# Patient Record
Sex: Female | Born: 1940
Health system: Southern US, Community
[De-identification: ages and names within clinical notes are randomized; demographics above are authoritative.]

## PROBLEM LIST (undated history)

## (undated) DIAGNOSIS — M1990 Unspecified osteoarthritis, unspecified site: Secondary | ICD-10-CM

## (undated) DIAGNOSIS — E039 Hypothyroidism, unspecified: Secondary | ICD-10-CM

## (undated) DIAGNOSIS — M1712 Unilateral primary osteoarthritis, left knee: Secondary | ICD-10-CM

## (undated) DIAGNOSIS — M1711 Unilateral primary osteoarthritis, right knee: Secondary | ICD-10-CM

## (undated) DIAGNOSIS — E785 Hyperlipidemia, unspecified: Secondary | ICD-10-CM

## (undated) DIAGNOSIS — M159 Polyosteoarthritis, unspecified: Secondary | ICD-10-CM

## (undated) DIAGNOSIS — Z9889 Other specified postprocedural states: Secondary | ICD-10-CM

## (undated) DIAGNOSIS — I10 Essential (primary) hypertension: Secondary | ICD-10-CM

## (undated) DIAGNOSIS — R112 Nausea with vomiting, unspecified: Secondary | ICD-10-CM

## (undated) DIAGNOSIS — I119 Hypertensive heart disease without heart failure: Secondary | ICD-10-CM

## (undated) DIAGNOSIS — Z973 Presence of spectacles and contact lenses: Secondary | ICD-10-CM

## (undated) DIAGNOSIS — Z789 Other specified health status: Secondary | ICD-10-CM

## (undated) HISTORY — DX: Other specified health status: Z78.9

## (undated) HISTORY — PX: EYE SURGERY: SHX253

## (undated) HISTORY — PX: TONSILLECTOMY: SUR1361

## (undated) HISTORY — PX: COLONOSCOPY: SHX174

## (undated) HISTORY — DX: Hypertensive heart disease without heart failure: I11.9

## (undated) HISTORY — DX: Polyosteoarthritis, unspecified: M15.9

---
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
## 1976-07-19 HISTORY — PX: ABDOMINAL HYSTERECTOMY: SHX81

## 2001-05-19 ENCOUNTER — Encounter: Payer: Self-pay | Admitting: Family Medicine

## 2001-05-19 ENCOUNTER — Encounter: Admission: RE | Admit: 2001-05-19 | Discharge: 2001-05-19 | Payer: Self-pay | Admitting: Family Medicine

## 2001-08-29 ENCOUNTER — Encounter: Payer: Self-pay | Admitting: Family Medicine

## 2001-08-29 ENCOUNTER — Encounter: Admission: RE | Admit: 2001-08-29 | Discharge: 2001-08-29 | Payer: Self-pay | Admitting: Family Medicine

## 2001-11-08 ENCOUNTER — Encounter: Payer: Self-pay | Admitting: Family Medicine

## 2001-11-08 ENCOUNTER — Encounter: Admission: RE | Admit: 2001-11-08 | Discharge: 2001-11-08 | Payer: Self-pay | Admitting: Family Medicine

## 2002-12-20 ENCOUNTER — Encounter: Admission: RE | Admit: 2002-12-20 | Discharge: 2002-12-20 | Payer: Self-pay | Admitting: Interventional Cardiology

## 2002-12-20 ENCOUNTER — Encounter: Payer: Self-pay | Admitting: Interventional Cardiology

## 2002-12-24 ENCOUNTER — Ambulatory Visit (HOSPITAL_COMMUNITY): Admission: RE | Admit: 2002-12-24 | Discharge: 2002-12-24 | Payer: Self-pay | Admitting: Interventional Cardiology

## 2003-07-08 ENCOUNTER — Encounter: Admission: RE | Admit: 2003-07-08 | Discharge: 2003-07-08 | Payer: Self-pay | Admitting: Family Medicine

## 2003-07-11 ENCOUNTER — Encounter: Admission: RE | Admit: 2003-07-11 | Discharge: 2003-07-11 | Payer: Self-pay | Admitting: Family Medicine

## 2004-02-20 ENCOUNTER — Encounter: Admission: RE | Admit: 2004-02-20 | Discharge: 2004-02-20 | Payer: Self-pay | Admitting: Family Medicine

## 2004-12-24 ENCOUNTER — Encounter: Admission: RE | Admit: 2004-12-24 | Discharge: 2004-12-24 | Payer: Self-pay | Admitting: Otolaryngology

## 2004-12-29 ENCOUNTER — Encounter: Admission: RE | Admit: 2004-12-29 | Discharge: 2004-12-29 | Payer: Self-pay | Admitting: Otolaryngology

## 2005-03-02 ENCOUNTER — Encounter: Admission: RE | Admit: 2005-03-02 | Discharge: 2005-03-02 | Payer: Self-pay | Admitting: Family Medicine

## 2006-03-03 ENCOUNTER — Encounter: Admission: RE | Admit: 2006-03-03 | Discharge: 2006-03-03 | Payer: Self-pay | Admitting: Family Medicine

## 2014-04-19 ENCOUNTER — Other Ambulatory Visit: Payer: Self-pay | Admitting: Internal Medicine

## 2014-04-19 ENCOUNTER — Inpatient Hospital Stay: Admission: RE | Admit: 2014-04-19 | Payer: Self-pay | Source: Ambulatory Visit

## 2014-04-19 DIAGNOSIS — R52 Pain, unspecified: Secondary | ICD-10-CM

## 2014-04-19 DIAGNOSIS — R609 Edema, unspecified: Secondary | ICD-10-CM

## 2014-04-24 ENCOUNTER — Ambulatory Visit
Admission: RE | Admit: 2014-04-24 | Discharge: 2014-04-24 | Disposition: A | Discharge status: Home / Self Care | Payer: Medicare Other | Source: Ambulatory Visit | Attending: Internal Medicine | Admitting: Internal Medicine

## 2014-04-24 DIAGNOSIS — R52 Pain, unspecified: Secondary | ICD-10-CM

## 2014-04-24 DIAGNOSIS — R609 Edema, unspecified: Secondary | ICD-10-CM

## 2014-04-24 IMAGING — US US EXTREM LOW VENOUS*R*
1 series · 13 of 24 positions shown · non-contrast
Comparison: None.

CLINICAL DATA: Right leg swelling and pain.  No previous injury.



[Series 1: us extrem low venous*right* · 13 of 31 slices shown]
[im 1/31]
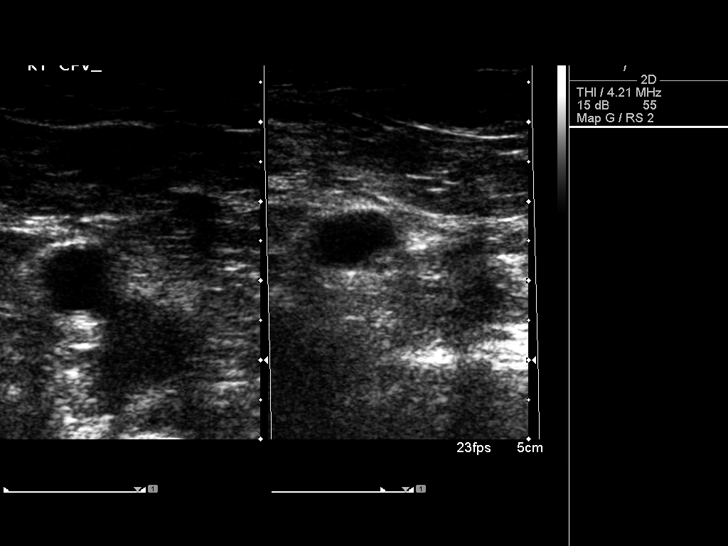
[im 3/31]
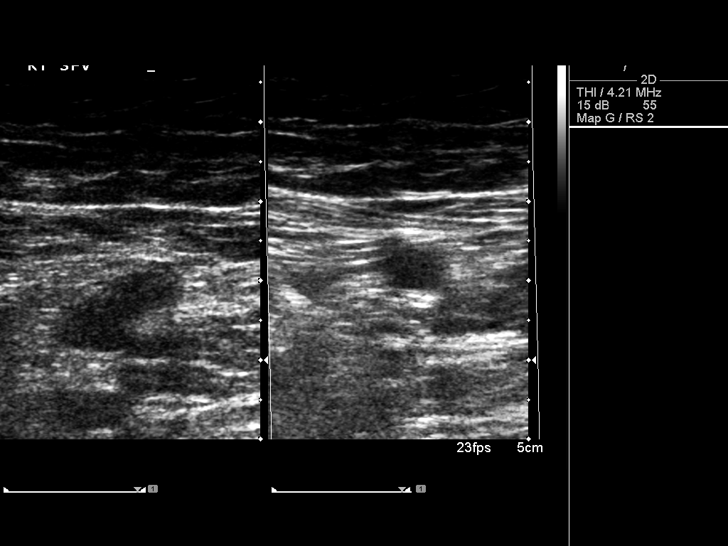
[im 6/31]
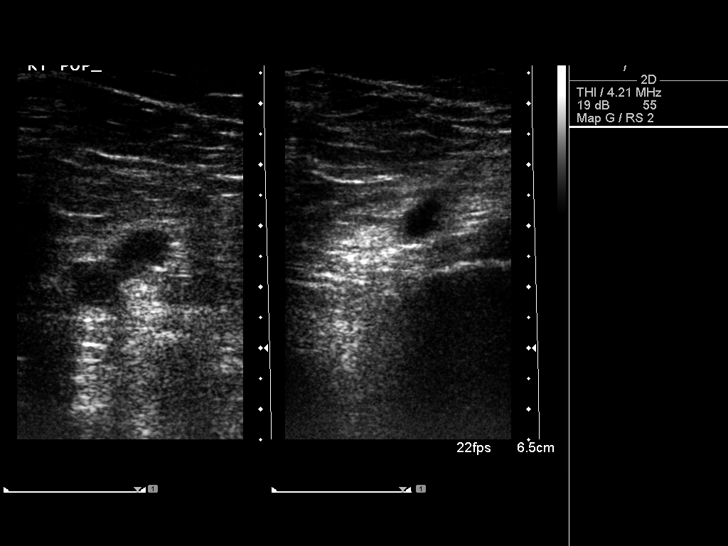
[im 8/31]
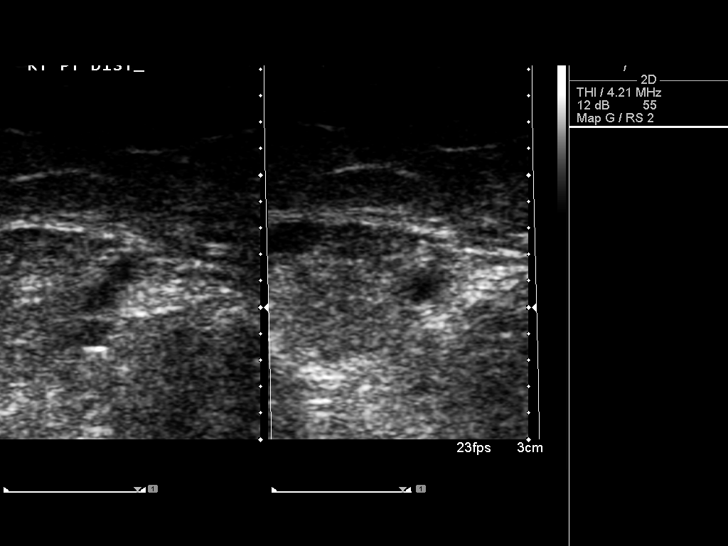
[im 11/31]
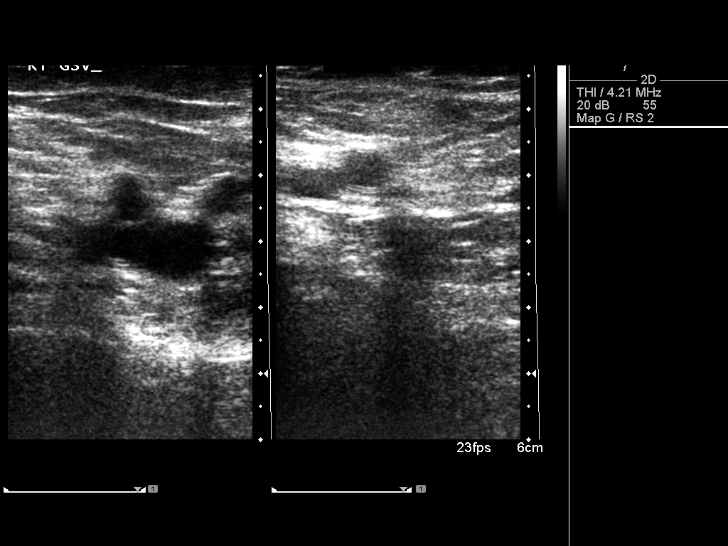
[im 14/31]
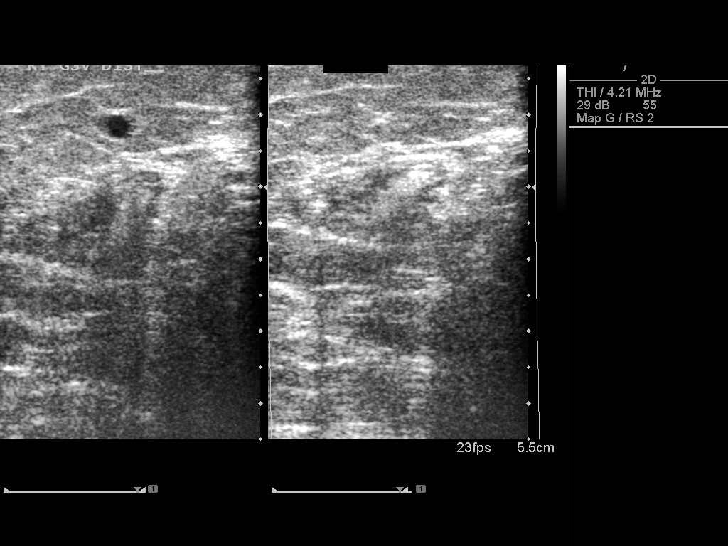
[im 16/31]
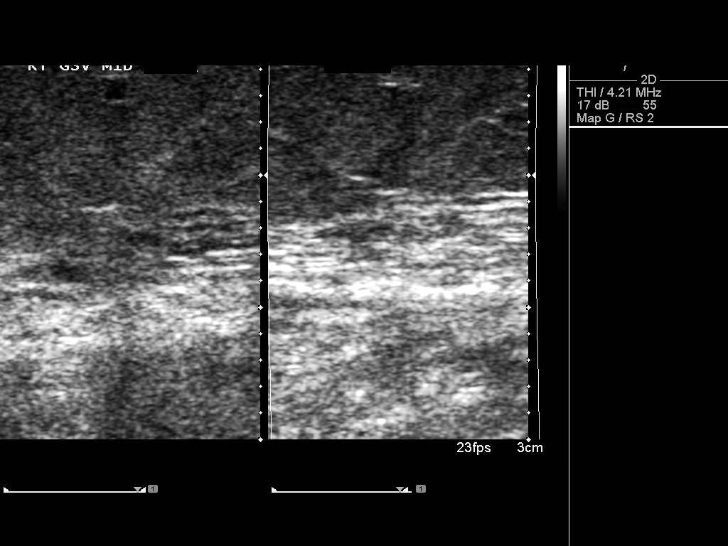
[im 17/31]
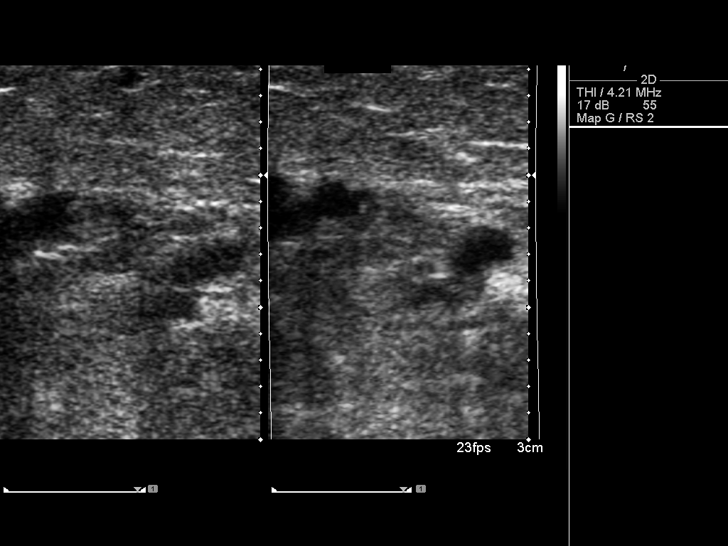
[im 20/31]
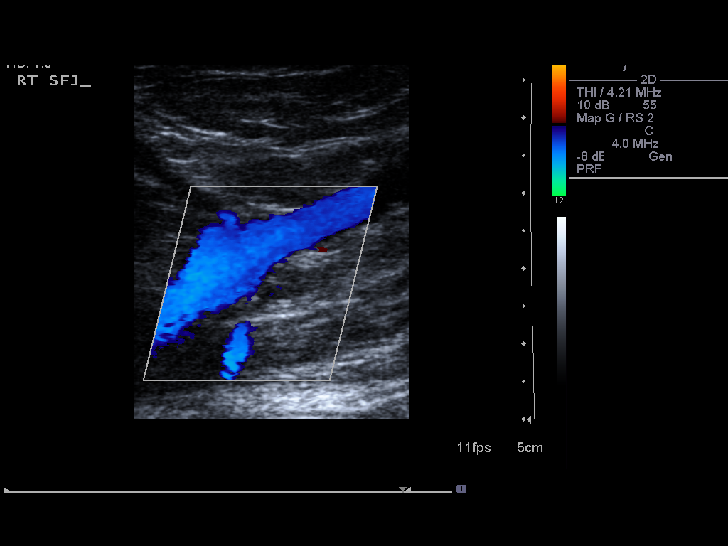
[im 23/31]
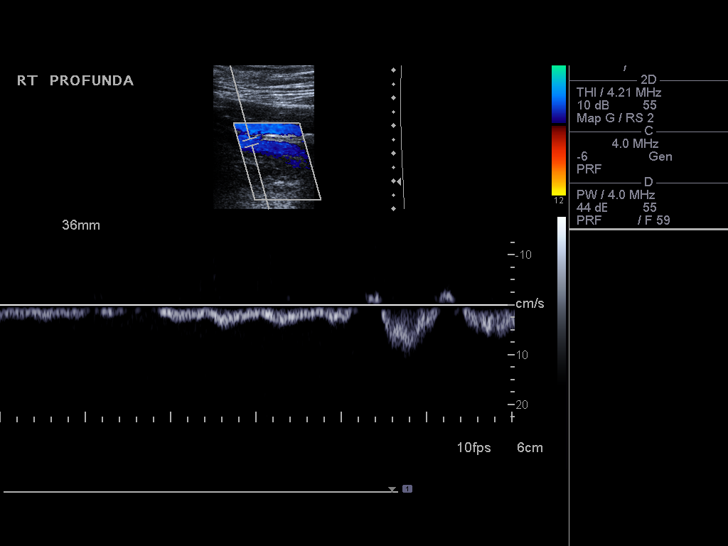
[im 25/31]
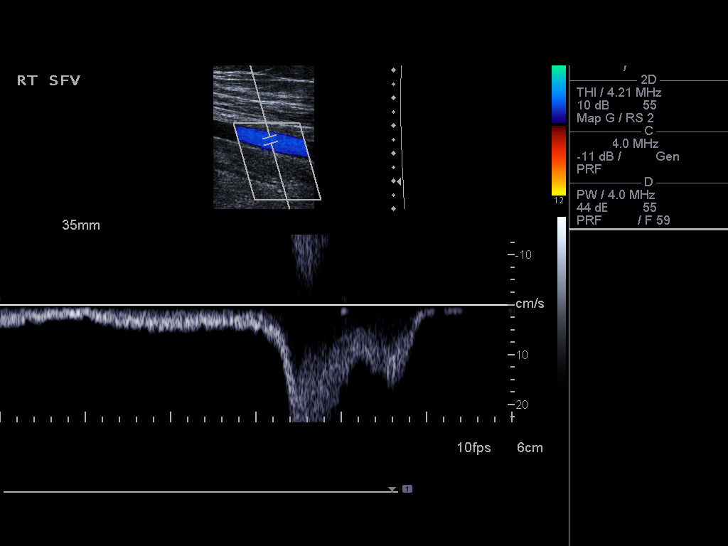
[im 28/31]
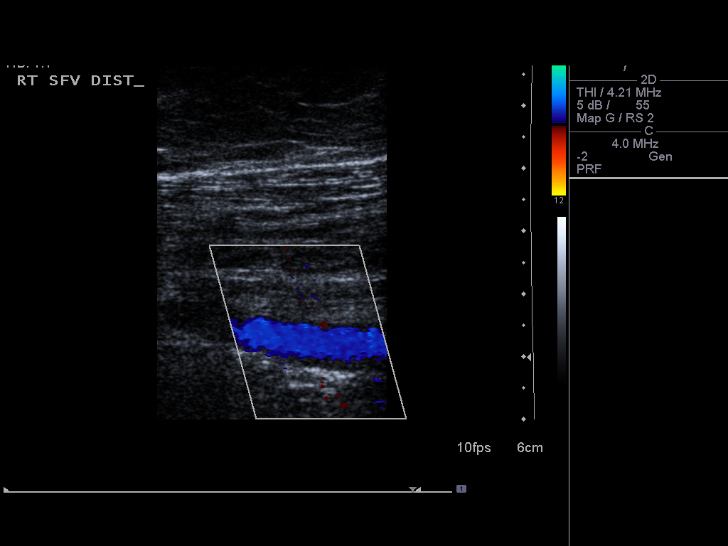
[im 31/31]
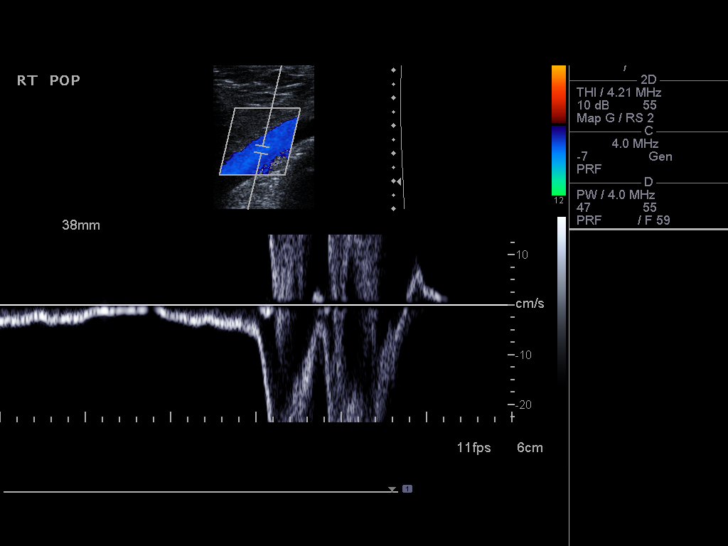

[13 of 24 positions shown; findings below may reference images not displayed]

FINDINGS: Common Femoral Vein: No evidence of thrombus. Normal
compressibility, respiratory phasicity and response to augmentation.

Saphenofemoral Junction: No evidence of thrombus. Normal
compressibility and flow on color Doppler imaging.

Profunda Femoral Vein: No evidence of thrombus. Normal
compressibility and flow on color Doppler imaging.

Femoral Vein: No evidence of thrombus. Normal compressibility,
respiratory phasicity and response to augmentation.

Popliteal Vein: No evidence of thrombus. Normal compressibility,
respiratory phasicity and response to augmentation.

Calf Veins: No evidence of thrombus. Normal compressibility and flow
on color Doppler imaging.

Superficial Great Saphenous Vein: No evidence of thrombus. Normal
compressibility and flow on color Doppler imaging.

Venous Reflux:  None.

Other Findings:  None.
IMPRESSION: No evidence of deep venous thrombosis.

## 2014-06-24 ENCOUNTER — Encounter (HOSPITAL_BASED_OUTPATIENT_CLINIC_OR_DEPARTMENT_OTHER): Payer: Self-pay | Admitting: *Deleted

## 2014-06-24 NOTE — Progress Notes (Signed)
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

## 2014-06-25 ENCOUNTER — Encounter (HOSPITAL_BASED_OUTPATIENT_CLINIC_OR_DEPARTMENT_OTHER)
Admission: RE | Admit: 2014-06-25 | Discharge: 2014-06-25 | Disposition: A | Discharge status: Home / Self Care | Payer: Medicare Other | Source: Ambulatory Visit | Attending: Orthopedic Surgery | Admitting: Orthopedic Surgery

## 2014-06-25 DIAGNOSIS — E785 Hyperlipidemia, unspecified: Secondary | ICD-10-CM | POA: Diagnosis not present

## 2014-06-25 DIAGNOSIS — Y939 Activity, unspecified: Secondary | ICD-10-CM | POA: Diagnosis not present

## 2014-06-25 DIAGNOSIS — Y999 Unspecified external cause status: Secondary | ICD-10-CM | POA: Diagnosis not present

## 2014-06-25 DIAGNOSIS — Y929 Unspecified place or not applicable: Secondary | ICD-10-CM | POA: Diagnosis not present

## 2014-06-25 DIAGNOSIS — M1990 Unspecified osteoarthritis, unspecified site: Secondary | ICD-10-CM | POA: Diagnosis not present

## 2014-06-25 DIAGNOSIS — Z7982 Long term (current) use of aspirin: Secondary | ICD-10-CM | POA: Diagnosis not present

## 2014-06-25 DIAGNOSIS — S86011A Strain of right Achilles tendon, initial encounter: Secondary | ICD-10-CM | POA: Diagnosis not present

## 2014-06-25 DIAGNOSIS — E039 Hypothyroidism, unspecified: Secondary | ICD-10-CM | POA: Diagnosis not present

## 2014-06-25 DIAGNOSIS — I10 Essential (primary) hypertension: Secondary | ICD-10-CM | POA: Diagnosis not present

## 2014-06-25 DIAGNOSIS — X58XXXA Exposure to other specified factors, initial encounter: Secondary | ICD-10-CM | POA: Diagnosis not present

## 2014-06-25 LAB — BASIC METABOLIC PANEL
ANION GAP: 15 (ref 5–15)
BUN: 30 mg/dL — ABNORMAL HIGH (ref 6–23)
CO2: 28 mEq/L (ref 19–32)
CREATININE: 0.94 mg/dL (ref 0.50–1.10)
Calcium: 11.1 mg/dL — ABNORMAL HIGH (ref 8.4–10.5)
Chloride: 95 mEq/L — ABNORMAL LOW (ref 96–112)
GFR, EST AFRICAN AMERICAN: 68 mL/min — AB (ref >90)
GFR, EST NON AFRICAN AMERICAN: 58 mL/min — AB (ref >90)
GLUCOSE: 100 mg/dL — AB (ref 70–99)
Potassium: 5.1 mEq/L (ref 3.7–5.3)
Sodium: 138 mEq/L (ref 137–147)

## 2014-06-26 ENCOUNTER — Other Ambulatory Visit: Payer: Self-pay | Admitting: Orthopedic Surgery

## 2014-06-26 NOTE — Progress Notes (Signed)
ekg cleared by dr crews 

## 2014-06-27 ENCOUNTER — Encounter (HOSPITAL_BASED_OUTPATIENT_CLINIC_OR_DEPARTMENT_OTHER): Payer: Self-pay | Admitting: Certified Registered"

## 2014-06-27 ENCOUNTER — Ambulatory Visit (HOSPITAL_BASED_OUTPATIENT_CLINIC_OR_DEPARTMENT_OTHER): Payer: Medicare Other | Admitting: Certified Registered"

## 2014-06-27 ENCOUNTER — Ambulatory Visit (HOSPITAL_BASED_OUTPATIENT_CLINIC_OR_DEPARTMENT_OTHER)
Admission: RE | Admit: 2014-06-27 | Discharge: 2014-06-27 | Disposition: A | Discharge status: Home / Self Care | Payer: Medicare Other | Source: Ambulatory Visit | Attending: Orthopedic Surgery | Admitting: Orthopedic Surgery

## 2014-06-27 ENCOUNTER — Encounter (HOSPITAL_BASED_OUTPATIENT_CLINIC_OR_DEPARTMENT_OTHER)
Admission: RE | Disposition: A | Discharge status: Home / Self Care | Payer: Self-pay | Source: Ambulatory Visit | Attending: Orthopedic Surgery

## 2014-06-27 DIAGNOSIS — S86011A Strain of right Achilles tendon, initial encounter: Secondary | ICD-10-CM | POA: Diagnosis not present

## 2014-06-27 DIAGNOSIS — Z7982 Long term (current) use of aspirin: Secondary | ICD-10-CM | POA: Insufficient documentation

## 2014-06-27 DIAGNOSIS — M1990 Unspecified osteoarthritis, unspecified site: Secondary | ICD-10-CM | POA: Insufficient documentation

## 2014-06-27 DIAGNOSIS — Y999 Unspecified external cause status: Secondary | ICD-10-CM | POA: Insufficient documentation

## 2014-06-27 DIAGNOSIS — E785 Hyperlipidemia, unspecified: Secondary | ICD-10-CM | POA: Insufficient documentation

## 2014-06-27 DIAGNOSIS — I10 Essential (primary) hypertension: Secondary | ICD-10-CM | POA: Diagnosis not present

## 2014-06-27 DIAGNOSIS — E039 Hypothyroidism, unspecified: Secondary | ICD-10-CM | POA: Diagnosis not present

## 2014-06-27 DIAGNOSIS — Y939 Activity, unspecified: Secondary | ICD-10-CM | POA: Insufficient documentation

## 2014-06-27 DIAGNOSIS — X58XXXA Exposure to other specified factors, initial encounter: Secondary | ICD-10-CM | POA: Insufficient documentation

## 2014-06-27 DIAGNOSIS — Y929 Unspecified place or not applicable: Secondary | ICD-10-CM | POA: Insufficient documentation

## 2014-06-27 HISTORY — DX: Hyperlipidemia, unspecified: E78.5

## 2014-06-27 HISTORY — PX: ACHILLES TENDON SURGERY: SHX542

## 2014-06-27 HISTORY — DX: Essential (primary) hypertension: I10

## 2014-06-27 HISTORY — DX: Unspecified osteoarthritis, unspecified site: M19.90

## 2014-06-27 HISTORY — DX: Hypothyroidism, unspecified: E03.9

## 2014-06-27 HISTORY — DX: Presence of spectacles and contact lenses: Z97.3

## 2014-06-27 LAB — POCT HEMOGLOBIN-HEMACUE: Hemoglobin: 16.7 g/dL — ABNORMAL HIGH (ref 12.0–15.0)

## 2014-06-27 SURGERY — REPAIR, TENDON, ACHILLES
Anesthesia: General | Site: Foot | Laterality: Right

## 2014-06-27 MED ORDER — 0.9 % SODIUM CHLORIDE (POUR BTL) OPTIME
TOPICAL | Status: DC | PRN
Start: 2014-06-27 — End: 2014-06-27
  Administered 2014-06-27: 300 mL

## 2014-06-27 MED ORDER — OXYCODONE HCL 5 MG PO TABS: 5.0000 mg | ORAL_TABLET | Freq: Once | ORAL | Status: DC | PRN

## 2014-06-27 MED ORDER — MIDAZOLAM HCL 2 MG/2ML IJ SOLN
1.0000 mg | INTRAMUSCULAR | Status: DC | PRN
  Administered 2014-06-27: 2 mg via INTRAVENOUS

## 2014-06-27 MED ORDER — BUPIVACAINE-EPINEPHRINE (PF) 0.5% -1:200000 IJ SOLN
INTRAMUSCULAR | Status: AC
  Filled 2014-06-27: qty 30

## 2014-06-27 MED ORDER — BACITRACIN ZINC 500 UNIT/GM EX OINT
TOPICAL_OINTMENT | CUTANEOUS | Status: AC
  Filled 2014-06-27: qty 28.35

## 2014-06-27 MED ORDER — EPHEDRINE SULFATE 50 MG/ML IJ SOLN
INTRAMUSCULAR | Status: DC | PRN
  Administered 2014-06-27 (×2): 10 mg via INTRAVENOUS

## 2014-06-27 MED ORDER — SUCCINYLCHOLINE CHLORIDE 20 MG/ML IJ SOLN
INTRAMUSCULAR | Status: DC | PRN
  Administered 2014-06-27: 100 mg via INTRAVENOUS

## 2014-06-27 MED ORDER — ONDANSETRON HCL 4 MG/2ML IJ SOLN: 4.0000 mg | Freq: Once | INTRAMUSCULAR | Status: DC | PRN

## 2014-06-27 MED ORDER — FENTANYL CITRATE 0.05 MG/ML IJ SOLN
INTRAMUSCULAR | Status: DC | PRN
  Administered 2014-06-27: 50 ug via INTRAVENOUS
  Administered 2014-06-27: 25 ug via INTRAVENOUS

## 2014-06-27 MED ORDER — ROPIVACAINE HCL 5 MG/ML IJ SOLN
INTRAMUSCULAR | Status: DC | PRN
  Administered 2014-06-27: 20 mL via PERINEURAL

## 2014-06-27 MED ORDER — BUPIVACAINE-EPINEPHRINE (PF) 0.5% -1:200000 IJ SOLN
INTRAMUSCULAR | Status: DC | PRN
  Administered 2014-06-27: 20 mL via PERINEURAL

## 2014-06-27 MED ORDER — ASPIRIN EC 325 MG PO TBEC: 325.0000 mg | DELAYED_RELEASE_TABLET | Freq: Every day | ORAL | Status: DC

## 2014-06-27 MED ORDER — PROPOFOL 10 MG/ML IV BOLUS
INTRAVENOUS | Status: DC | PRN
  Administered 2014-06-27: 15 mg via INTRAVENOUS

## 2014-06-27 MED ORDER — SODIUM CHLORIDE 0.9 % IV SOLN: INTRAVENOUS | Status: DC

## 2014-06-27 MED ORDER — MIDAZOLAM HCL 2 MG/2ML IJ SOLN
INTRAMUSCULAR | Status: AC
  Filled 2014-06-27: qty 2

## 2014-06-27 MED ORDER — LIDOCAINE HCL (CARDIAC) 20 MG/ML IV SOLN
INTRAVENOUS | Status: DC | PRN
  Administered 2014-06-27: 20 mg via INTRAVENOUS

## 2014-06-27 MED ORDER — FENTANYL CITRATE 0.05 MG/ML IJ SOLN
INTRAMUSCULAR | Status: AC
  Filled 2014-06-27: qty 6

## 2014-06-27 MED ORDER — PROPOFOL 10 MG/ML IV BOLUS
INTRAVENOUS | Status: AC
Start: 2014-06-27 — End: 2014-06-27
  Filled 2014-06-27: qty 20

## 2014-06-27 MED ORDER — FENTANYL CITRATE 0.05 MG/ML IJ SOLN
INTRAMUSCULAR | Status: AC
  Filled 2014-06-27: qty 2

## 2014-06-27 MED ORDER — CEFAZOLIN SODIUM-DEXTROSE 2-3 GM-% IV SOLR
INTRAVENOUS | Status: AC
  Filled 2014-06-27: qty 50

## 2014-06-27 MED ORDER — FENTANYL CITRATE 0.05 MG/ML IJ SOLN
50.0000 ug | INTRAMUSCULAR | Status: DC | PRN
  Administered 2014-06-27: 100 ug via INTRAVENOUS

## 2014-06-27 MED ORDER — SUCCINYLCHOLINE CHLORIDE 20 MG/ML IJ SOLN
INTRAMUSCULAR | Status: AC
  Filled 2014-06-27: qty 1

## 2014-06-27 MED ORDER — ONDANSETRON HCL 4 MG/2ML IJ SOLN
INTRAMUSCULAR | Status: DC | PRN
  Administered 2014-06-27: 4 mg via INTRAVENOUS

## 2014-06-27 MED ORDER — OXYCODONE HCL 5 MG PO TABS: 5.0000 mg | ORAL_TABLET | ORAL | Status: DC | PRN

## 2014-06-27 MED ORDER — OXYCODONE HCL 5 MG/5ML PO SOLN: 5.0000 mg | Freq: Once | ORAL | Status: DC | PRN

## 2014-06-27 MED ORDER — LACTATED RINGERS IV SOLN
INTRAVENOUS | Status: DC
  Administered 2014-06-27: 11:00:00 via INTRAVENOUS

## 2014-06-27 MED ORDER — CEFAZOLIN SODIUM-DEXTROSE 2-3 GM-% IV SOLR
2.0000 g | INTRAVENOUS | Status: AC
  Administered 2014-06-27: 2 g via INTRAVENOUS

## 2014-06-27 MED ORDER — DEXAMETHASONE SODIUM PHOSPHATE 4 MG/ML IJ SOLN
INTRAMUSCULAR | Status: DC | PRN
  Administered 2014-06-27: 10 mg via INTRAVENOUS

## 2014-06-27 MED ORDER — HYDROMORPHONE HCL 1 MG/ML IJ SOLN: 0.2500 mg | INTRAMUSCULAR | Status: DC | PRN

## 2014-06-27 MED ORDER — CHLORHEXIDINE GLUCONATE 4 % EX LIQD: 60.0000 mL | Freq: Once | CUTANEOUS | Status: DC

## 2014-06-27 MED ORDER — BACITRACIN ZINC 500 UNIT/GM EX OINT
TOPICAL_OINTMENT | CUTANEOUS | Status: DC | PRN
  Administered 2014-06-27: 1 via TOPICAL

## 2014-06-27 SURGICAL SUPPLY — 81 items
BANDAGE ESMARK 6X9 LF (GAUZE/BANDAGES/DRESSINGS) ×2 IMPLANT
BLADE AVERAGE 25X9 (BLADE) IMPLANT
BLADE SURG 15 STRL LF DISP TIS (BLADE) ×4 IMPLANT
BLADE SURG 15 STRL SS (BLADE) ×2
BNDG CMPR 9X6 STRL LF SNTH (GAUZE/BANDAGES/DRESSINGS) ×1
BNDG COHESIVE 4X5 TAN STRL (GAUZE/BANDAGES/DRESSINGS) ×3 IMPLANT
BNDG COHESIVE 6X5 TAN STRL LF (GAUZE/BANDAGES/DRESSINGS) ×3 IMPLANT
BNDG ESMARK 6X9 LF (GAUZE/BANDAGES/DRESSINGS) ×3
CANISTER SUCT 1200ML W/VALVE (MISCELLANEOUS) ×3 IMPLANT
CHLORAPREP W/TINT 26ML (MISCELLANEOUS) ×3 IMPLANT
COVER BACK TABLE 60X90IN (DRAPES) ×3 IMPLANT
CUFF TOURNIQUET SINGLE 34IN LL (TOURNIQUET CUFF) ×3 IMPLANT
DERMASPAN .5-.9MM 4X4CM SHOU (Miscellaneous) ×3 IMPLANT
DRAPE EXTREMITY T 121X128X90 (DRAPE) ×3 IMPLANT
DRAPE OEC MINIVIEW 54X84 (DRAPES) ×3 IMPLANT
DRAPE U-SHAPE 47X51 STRL (DRAPES) ×3 IMPLANT
DRSG EMULSION OIL 3X3 NADH (GAUZE/BANDAGES/DRESSINGS) IMPLANT
DRSG PAD ABDOMINAL 8X10 ST (GAUZE/BANDAGES/DRESSINGS) ×6 IMPLANT
ELECT REM PT RETURN 9FT ADLT (ELECTROSURGICAL) ×3
ELECTRODE REM PT RTRN 9FT ADLT (ELECTROSURGICAL) ×2 IMPLANT
GAUZE SPONGE 4X4 12PLY STRL (GAUZE/BANDAGES/DRESSINGS) ×3 IMPLANT
GLOVE BIO SURGEON STRL SZ8 (GLOVE) ×3 IMPLANT
GLOVE BIOGEL PI IND STRL 7.0 (GLOVE) ×2 IMPLANT
GLOVE BIOGEL PI IND STRL 8 (GLOVE) ×2 IMPLANT
GLOVE BIOGEL PI INDICATOR 7.0 (GLOVE) ×1
GLOVE BIOGEL PI INDICATOR 8 (GLOVE) ×1
GLOVE ECLIPSE 6.5 STRL STRAW (GLOVE) ×3 IMPLANT
GLOVE EXAM NITRILE LRG STRL (GLOVE) ×3 IMPLANT
GLOVE EXAM NITRILE MD LF STRL (GLOVE) IMPLANT
GOWN STRL REUS W/ TWL LRG LVL3 (GOWN DISPOSABLE) ×2 IMPLANT
GOWN STRL REUS W/ TWL XL LVL3 (GOWN DISPOSABLE) ×2 IMPLANT
GOWN STRL REUS W/TWL LRG LVL3 (GOWN DISPOSABLE) ×2
GOWN STRL REUS W/TWL XL LVL3 (GOWN DISPOSABLE) ×1
GUIDEWIRE 1.1X6IN (WIRE) ×3 IMPLANT
KIT BIO-TENODESIS 3X8 DISP (MISCELLANEOUS)
KIT INSRT BABSR STRL DISP BTN (MISCELLANEOUS) IMPLANT
NDL SAFETY ECLIPSE 18X1.5 (NEEDLE) IMPLANT
NDL SUT 6 .5 CRC .975X.05 MAYO (NEEDLE) IMPLANT
NEEDLE HYPO 18GX1.5 SHARP (NEEDLE)
NEEDLE HYPO 22GX1.5 SAFETY (NEEDLE) IMPLANT
NEEDLE HYPO 25X1 1.5 SAFETY (NEEDLE) IMPLANT
NEEDLE MAYO TAPER (NEEDLE)
NS IRRIG 1000ML POUR BTL (IV SOLUTION) ×3 IMPLANT
PACK BASIN DAY SURGERY FS (CUSTOM PROCEDURE TRAY) ×3 IMPLANT
PAD CAST 4YDX4 CTTN HI CHSV (CAST SUPPLIES) ×2 IMPLANT
PADDING CAST ABS 4INX4YD NS (CAST SUPPLIES) ×1
PADDING CAST ABS COTTON 4X4 ST (CAST SUPPLIES) ×2 IMPLANT
PADDING CAST COTTON 4X4 STRL (CAST SUPPLIES) ×3
PADDING CAST COTTON 6X4 STRL (CAST SUPPLIES) ×3 IMPLANT
PENCIL BUTTON HOLSTER BLD 10FT (ELECTRODE) ×3 IMPLANT
SANITIZER HAND PURELL 535ML FO (MISCELLANEOUS) ×3 IMPLANT
SCREW INT RATTLER XL 6X20MM (Screw) ×3 IMPLANT
SHEET MEDIUM DRAPE 40X70 STRL (DRAPES) ×3 IMPLANT
SLEEVE SCD COMPRESS KNEE MED (MISCELLANEOUS) ×3 IMPLANT
SPLINT FAST PLASTER 5X30 (CAST SUPPLIES) ×20
SPLINT PLASTER CAST FAST 5X30 (CAST SUPPLIES) ×40 IMPLANT
SPONGE LAP 18X18 X RAY DECT (DISPOSABLE) ×3 IMPLANT
STAPLER VISISTAT 35W (STAPLE) IMPLANT
STOCKINETTE 6  STRL (DRAPES) ×1
STOCKINETTE 6 STRL (DRAPES) ×2 IMPLANT
SUCTION FRAZIER TIP 10 FR DISP (SUCTIONS) ×3 IMPLANT
SUT 2 FIBERLOOP 20 STRT BLUE (SUTURE)
SUT ETHIBOND 2 OS 4 DA (SUTURE) IMPLANT
SUT ETHILON 3 0 PS 1 (SUTURE) ×6 IMPLANT
SUT FIBERWIRE #2 38 T-5 BLUE (SUTURE) ×6
SUT MNCRL AB 3-0 PS2 18 (SUTURE) ×9 IMPLANT
SUT VIC AB 0 SH 27 (SUTURE) ×6 IMPLANT
SUT VIC AB 2-0 SH 18 (SUTURE) IMPLANT
SUT VIC AB 2-0 SH 27 (SUTURE)
SUT VIC AB 2-0 SH 27XBRD (SUTURE) IMPLANT
SUT VICRYL 4-0 PS2 18IN ABS (SUTURE) IMPLANT
SUTURE 2 FIBERLOOP 20 STRT BLU (SUTURE) IMPLANT
SUTURE FIBERWR #2 38 T-5 BLUE (SUTURE) ×4 IMPLANT
SYR BULB 3OZ (MISCELLANEOUS) ×3 IMPLANT
SYR CONTROL 10ML LL (SYRINGE) IMPLANT
Screw Rattler Cannulated 6.0 MM x 20 MM (Screw) IMPLANT
TOWEL OR 17X24 6PK STRL BLUE (TOWEL DISPOSABLE) ×3 IMPLANT
TOWEL OR NON WOVEN STRL DISP B (DISPOSABLE) IMPLANT
TUBE CONNECTING 20X1/4 (TUBING) ×3 IMPLANT
UNDERPAD 30X30 INCONTINENT (UNDERPADS AND DIAPERS) ×3 IMPLANT
YANKAUER SUCT BULB TIP NO VENT (SUCTIONS) IMPLANT

## 2014-06-27 NOTE — Transfer of Care (Signed)
Immediate Anesthesia Transfer of Care Note  Patient: [name]  Procedure(s) Performed: Procedure(s): RIGHT ACHILLES DEBRIDEMENT AND REPAIR WITH TRANSFER OF FLEXOR HALLUCIS LONGUS TENDON TO THE CALCANEUS   (Right)  Patient Location: PACU  Anesthesia Type:GA combined with regional for post-op pain  Level of Consciousness: awake, alert , oriented and patient cooperative  Airway & Oxygen Therapy: Patient Spontanous Breathing and Patient connected to face mask oxygen  Post-op Assessment: Report given to PACU RN and Post -op Vital signs reviewed and stable  Post vital signs: Reviewed and stable  Complications: No apparent anesthesia complications

## 2014-06-27 NOTE — Anesthesia Procedure Notes (Addendum)
Anesthesia Regional Block:  Popliteal block  Pre-Anesthetic Checklist: ,, timeout performed, Correct Patient, Correct Site, Correct Laterality, Correct Procedure, Correct Position, site marked, Risks and benefits discussed,  Surgical consent,  Pre-op evaluation,  At surgeon's request and post-op pain management  Laterality: Right and Lower  Prep: chloraprep       Needles:  Injection technique: Single-shot  Needle Type: Echogenic Needle     Needle Length: 9cm 9 cm Needle Gauge: 21 and 21 G    Additional Needles:  Procedures: ultrasound guided (picture in chart) Popliteal block Narrative:  Start time: 06/27/2014 11:04 AM End time: 06/27/2014 11:08 AM Injection made incrementally with aspirations every 5 mL.  Performed by: Personally  Anesthesiologist: CREWS, DAVID A   Anesthesia Regional Block:  Adductor canal block  Pre-Anesthetic Checklist: ,, timeout performed, Correct Patient, Correct Site, Correct Laterality, Correct Procedure, Correct Position, site marked, Risks and benefits discussed,  Surgical consent,  Pre-op evaluation,  At surgeon's request and post-op pain management  Laterality: Right and Lower  Prep: chloraprep       Needles:  Injection technique: Single-shot  Needle Type: Echogenic Needle     Needle Length: 9cm 9 cm Needle Gauge: 21 and 21 G    Additional Needles:  Procedures: ultrasound guided (picture in chart) Adductor canal block Narrative:  Start time: 06/27/2014 11:09 AM End time: 06/27/2014 11:13 AM Injection made incrementally with aspirations every 5 mL.  Performed by: Personally  Anesthesiologist: CREWS, DAVID A   Procedure Name: Intubation Date/Time: 06/27/2014 11:53 AM Performed by: Trenia Tennyson Pre-anesthesia Checklist: Patient identified, Emergency Drugs available, Suction available and Patient being monitored Patient Re-evaluated:Patient Re-evaluated prior to inductionOxygen Delivery Method: Circle System  Utilized Preoxygenation: Pre-oxygenation with 100% oxygen Intubation Type: IV induction Ventilation: Mask ventilation without difficulty Laryngoscope Size: Mac and 3 Grade View: Grade I Tube type: Oral Tube size: 7.0 mm Number of attempts: 1 Airway Equipment and Method: stylet and oral airway Placement Confirmation: ETT inserted through vocal cords under direct vision,  positive ETCO2 and breath sounds checked- equal and bilateral Secured at: 21 cm Tube secured with: Tape Dental Injury: Teeth and Oropharynx as per pre-operative assessment  Comments: Slight anterior airway grade one view with cricoid manipulation

## 2014-06-27 NOTE — Anesthesia Preprocedure Evaluation (Addendum)
Anesthesia Evaluation  Patient identified by MRN, date of birth, ID band Patient awake    Reviewed: Allergy & Precautions, H&P , NPO status , Patient's Chart, lab work & pertinent test results  Airway Mallampati: I  TM Distance: >3 FB Neck ROM: Full    Dental  (+) Teeth Intact,    Pulmonary  breath sounds clear to auscultation        Cardiovascular hypertension, Pt. on medications Rhythm:Regular Rate:Normal     Neuro/Psych    GI/Hepatic   Endo/Other  Hypothyroidism   Renal/GU      Musculoskeletal  (+) Arthritis -,   Abdominal   Peds  Hematology   Anesthesia Other Findings   Reproductive/Obstetrics                           Anesthesia Physical Anesthesia Plan  ASA: II  Anesthesia Plan: General   Post-op Pain Management:    Induction: Intravenous  Airway Management Planned: LMA  Additional Equipment:   Intra-op Plan:   Post-operative Plan: Extubation in OR  Informed Consent: I have reviewed the patients History and Physical, chart, labs and discussed the procedure including the risks, benefits and alternatives for the proposed anesthesia with the patient or authorized representative who has indicated his/her understanding and acceptance.   Dental advisory given  Plan Discussed with: CRNA, Anesthesiologist and Surgeon  Anesthesia Plan Comments:         Anesthesia Quick Evaluation

## 2014-06-27 NOTE — Anesthesia Postprocedure Evaluation (Signed)
  Anesthesia Post-op Note  Patient: [name]  Procedure(s) Performed: Procedure(s): RIGHT ACHILLES DEBRIDEMENT AND REPAIR WITH TRANSFER OF FLEXOR HALLUCIS LONGUS TENDON TO THE CALCANEUS   (Right)  Patient Location: PACU  Anesthesia Type: General with regional for pain post op   Level of Consciousness: awake, alert  and oriented  Airway and Oxygen Therapy: Patient Spontanous Breathing  Post-op Pain: mild  Post-op Assessment: Post-op Vital signs reviewed  Post-op Vital Signs: Reviewed  Last Vitals:  Filed Vitals:   06/27/14 1425  BP:   Pulse: 94  Temp:   Resp: 14    Complications: No apparent anesthesia complications

## 2014-06-27 NOTE — Brief Op Note (Signed)
06/27/2014  1:40 PM  PATIENT:  [name]  73 y.o. female  PRE-OPERATIVE DIAGNOSIS:  right achilles tendon chronic rupture  POST-OPERATIVE DIAGNOSIS:  same  Procedure(s): 1.  Right achilles tendon reconstruction with V-Y advancement and allograft tissue augmentation 2.  Transfer of right flexor hallucis longus tendon to the calcaneus  SURGEON:  [name], MD  ASSISTANT: n/a  ANESTHESIA:   General, regional  EBL:  minimal   TOURNIQUET:   Total Tourniquet Time Documented: Thigh (Right) - 88 minutes Total: Thigh (Right) - 88 minutes   COMPLICATIONS:  None apparent  DISPOSITION:  Extubated, awake and stable to recovery.  DICTATION ID:  [id number]

## 2014-06-27 NOTE — H&P (Signed)
[name] is an 73 y.o. female.   Chief Complaint: right achilles rupture HPI: 73 y/o female with h/o non insertional achilles tendonopathy.  She ruptured the right achilles and presents now for surgical repair.  Past Medical History  Diagnosis Date  . Hypertension   . Hypothyroidism   . Arthritis   . Wears glasses   . Hyperlipemia     Past Surgical History  Procedure Laterality Date  . Abdominal hysterectomy  1978  . Colonoscopy    . Eye surgery  [phone]    cataracts-both  . Tonsillectomy      History reviewed. No pertinent family history. Social History:  reports that she has never smoked. She does not have any smokeless tobacco history on file. She reports that she does not drink alcohol or use illicit drugs.  Allergies: No Known Allergies  Medications Prior to Admission  Medication Sig Dispense Refill  . acetaminophen (TYLENOL) 325 MG tablet Take 650 mg by mouth every 6 (six) hours as needed.    [name] ascorbic acid (VITAMIN C) 500 MG tablet Take 500 mg by mouth daily.    . calcium carbonate (OS-CAL) 600 MG TABS tablet Take 600 mg by mouth 2 (two) times daily with a meal.    . Glucosamine-Chondroit-Vit C-Mn (GLUCOSAMINE CHONDR 500 COMPLEX) CAPS Take by mouth.    . levothyroxine (SYNTHROID, LEVOTHROID) 100 MCG tablet Take 100 mcg by mouth daily before breakfast.    . lisinopril (PRINIVIL,ZESTRIL) 10 MG tablet Take 10 mg by mouth daily.    . pravastatin (PRAVACHOL) 20 MG tablet Take 20 mg by mouth daily.    [name] aspirin 81 MG tablet Take 81 mg by mouth daily.      Results for orders placed or performed during the hospital encounter of 06/27/14 (from the past 48 hour(s))  Basic metabolic panel     Status: Abnormal   Collection Time: 06/25/14 12:05 PM  Result Value Ref Range   Sodium 138 137 - 147 mEq/L   Potassium 5.1 3.7 - 5.3 mEq/L   Chloride 95 (L) 96 - 112 mEq/L   CO2 28 19 - 32 mEq/L   Glucose, Bld 100 (H) 70 - 99 mg/dL   BUN 30 (H) 6 - 23 mg/dL   Creatinine,  Ser 0.94 0.50 - 1.10 mg/dL   Calcium 11.1 (H) 8.4 - 10.5 mg/dL   GFR calc non Af Amer 58 (L) >90 mL/min   GFR calc Af Amer 68 (L) >90 mL/min    Comment: (NOTE) The eGFR has been calculated using the CKD EPI equation. This calculation has not been validated in all clinical situations. eGFR's persistently <90 mL/min signify possible Chronic Kidney Disease.    Anion gap 15 5 - 15  Hemoglobin-hemacue, POC     Status: Abnormal   Collection Time: 06/27/14 11:19 AM  Result Value Ref Range   Hemoglobin 16.7 (H) 12.0 - 15.0 g/dL   No results found.  ROS  No recent f/c/nv//wt loss.  Blood pressure 114/41, pulse 86, temperature 97.7 F (36.5 C), temperature source Oral, resp. rate 13, height '5\' 7"'  (1.702 m), weight 80.74 kg (178 lb), SpO2 99 %. Physical Exam  wn wd female in nad.  A and o x 4.  Mood and affect normal.  EOMi.  resp unlaobred.  R ankle with palpable defect at the achilles.  TTP in that area.  + thompson test.  NVI.  Skin heallthy.  No lymphadenopathy.  Assessment/Plan R achilles tendon rupture - to OR  for repair with likely gastroc recession and possible need for FHL transfer.  The risks and benefits of the alternative treatment options have been discussed in detail.  The patient wishes to proceed with surgery and specifically understands risks of bleeding, infection, nerve damage, blood clots, need for additional surgery, amputation and death.   [name] 2014/07/09, 11:44 AM

## 2014-06-27 NOTE — Progress Notes (Signed)
Assisted Dr. Crews with right, ultrasound guided, popliteal/saphenous block. Side rails up, monitors on throughout procedure. See vital signs in flow sheet. Tolerated Procedure well. 

## 2014-06-27 NOTE — Discharge Instructions (Signed)
°Post Anesthesia Home Care Instructions ° °Activity: °Get plenty of rest for the remainder of the day. A responsible adult should stay with you for 24 hours following the procedure.  °For the next 24 hours, DO NOT: °-Drive a car °-Operate machinery °-Drink alcoholic beverages °-Take any medication unless instructed by your physician °-Make any legal decisions or sign important papers. ° °Meals: °Start with liquid foods such as gelatin or soup. Progress to regular foods as tolerated. Avoid greasy, spicy, heavy foods. If nausea and/or vomiting occur, drink only clear liquids until the nausea and/or vomiting subsides. Call your physician if vomiting continues. ° °Special Instructions/Symptoms: °Your throat may feel dry or sore from the anesthesia or the breathing tube placed in your throat during surgery. If this causes discomfort, gargle with warm salt water. The discomfort should disappear within 24 hours. °Regional Anesthesia Blocks ° °1. Numbness or the inability to move the "blocked" extremity may last from 3-48 hours after placement. The length of time depends on the medication injected and your individual response to the medication. If the numbness is not going away after 48 hours, call your surgeon. ° °2. The extremity that is blocked will need to be protected until the numbness is gone and the  Strength has returned. Because you cannot feel it, you will need to take extra care to avoid injury. Because it may be weak, you may have difficulty moving it or using it. You may not know what position it is in without looking at it while the block is in effect. ° °3. For blocks in the legs and feet, returning to weight bearing and walking needs to be done carefully. You will need to wait until the numbness is entirely gone and the strength has returned. You should be able to move your leg and foot normally before you try and bear weight or walk. You will need someone to be with you when you first try to ensure you do  not fall and possibly risk injury. ° °4. Bruising and tenderness at the needle site are common side effects and will resolve in a few days. ° °5. Persistent numbness or new problems with movement should be communicated to the surgeon or the [facility] ([phone])/ [facility] ([phone]). ° °Vernona Peake, MD °[facility] ° °Please read the following information regarding your care after surgery. ° °Medications  °You only need a prescription for the narcotic pain medicine (ex. oxycodone, Percocet, Norco).  All of the other medicines listed below are available over the counter. °X acetominophen (Tylenol) 650 mg every 4-6 hours as you need for minor pain °X oxycodone as prescribed for moderate to severe pain ° °Narcotic pain medicine (ex. oxycodone, Percocet, Vicodin) will cause constipation.  To prevent this problem, take the following medicines while you are taking any pain medicine. °X docusate sodium (Colace) 100 mg twice a day X senna (Senokot) 2 tablets twice a day ° °X To help prevent blood clots, take an aspirin (325 mg) once a day for a month after surgery.  You should also get up every hour while you are awake to move around.   ° °Weight Bearing °? Bear weight when you are able on your operated leg or foot. °? Bear weight only on the heel of your operated foot in the post-op shoe. °X Do not bear any weight on the operated leg or foot. ° °Cast / Splint / Dressing °X Keep your splint or cast clean and dry.    Don’t put anything (coat hanger, pencil, etc) down inside of it.  If it gets damp, use a hair dryer on the cool setting to dry it.  If it gets soaked, call the office to schedule an appointment for a cast change. °? Remove your dressing 3 days after surgery and cover the incisions with dry dressings.   ° °After your dressing, cast or splint is removed; you may shower, but do not soak or scrub the wound.  Allow the water to run over it, and then gently pat it  dry. ° °Swelling °It is normal for you to have swelling where you had surgery.  To reduce swelling and pain, keep your toes above your nose for at least 3 days after surgery.  It may be necessary to keep your foot or leg elevated for several weeks.  If it hurts, it should be elevated. ° °Follow Up °Call my office at [phone] when you are discharged from the hospital or surgery center to schedule an appointment to be seen two weeks after surgery. ° °Call my office at [phone] if you develop a fever >101.5° F, nausea, vomiting, bleeding from the surgical site or severe pain.   ° ° °

## 2014-06-28 ENCOUNTER — Encounter (HOSPITAL_BASED_OUTPATIENT_CLINIC_OR_DEPARTMENT_OTHER): Payer: Self-pay | Admitting: Orthopedic Surgery

## 2014-06-28 NOTE — Op Note (Signed)
[name], [name]             ACCOUNT NO.:  [id number]  MEDICAL RECORD NO.:  [id number]  LOCATION:                                 FACILITY:  PHYSICIAN:  [name], MD             DATE OF BIRTH:  DATE OF PROCEDURE:  06/27/2014 DATE OF DISCHARGE:                              OPERATIVE REPORT   PREOPERATIVE DIAGNOSIS:  Chronic right Achilles tendon rupture.  POSTOPERATIVE DIAGNOSIS:  Chronic right Achilles tendon rupture.  PROCEDURE: 1. Right Achilles tendon reconstruction with V-Y advancement and     allograft tissue augmentation. 2. Transfer of right flexor hallucis longus tendon to the calcaneus.  SURGEON:  [name], MD  ANESTHESIA:  General, regional.  ESTIMATED BLOOD LOSS:  Minimal.  TOURNIQUET TIME:  88 minutes at 250 mmHg.  COMPLICATIONS:  None apparent.  DISPOSITION:  Extubated, awake, and stable to recovery.  INDICATIONS FOR PROCEDURE:  The patient is a 73 year old woman with past medical history significant for bilateral Achilles noninsertional tendinopathy.  She was initially on the schedule for debridement of the right Achilles tendon today.  She was seen in the office several months ago.  She presented earlier this week with a complaint of pain in the Achilles that had been going on for a couple of months.  She was found on physical exam to have a torn Achilles tendon.  This appeared to be chronic in nature.  She presents now for operative treatment of this painful condition.  She understands the risks and benefits, the alternative treatment options, and elects surgical treatment.  She specifically understands risks of bleeding, infection, nerve damage, blood clots, need for additional surgery, continued pain, amputation, and death.  PROCEDURE IN DETAIL:  After preoperative consent was obtained and the correct operative site was identified, the patient was brought to the operating room and placed supine on the operating table.  General anesthesia  was induced.  Preoperative antibiotics were administered. Surgical time-out was taken.  The patient was then turned into the prone position on the operating table after exsanguinating the leg and inflating the tourniquet.  The right lower extremity was then prepped and draped in standard sterile fashion.  A longitudinal incision was made over the palpable defect in the Achilles.  The incision was carried down through the skin, subcutaneous tissue, and paratenon.  The tendon was noted to be ruptured.  This appeared to be chronic rupture.  The tendon ends were freshened of the areas of tendinopathy.  Tagging suture was placed in the proximal end and used to pull it down into the wound. The tendon was freed up from the adhesions peripherally.  An effort was made to harvest the plantaris tendon, but it was noted to be quite degenerated and substantial.  There was a gap of approximately 2 cm between the 2 tendon ends.  A decision was made to proceed with a V-Y advancement.  A longitudinal incision was made at the level of the gastrocnemius tendon.  Sharp dissection was carried down through the skin and subcutaneous tissue taking care to protect the sural nerve and lesser saphenous vein.  The gastroc and soleus tendons were then divided  in the shape of V.  This allowed the gap to be closed down to just a mm or 2.  The decision was made to transfer the flexor hallucis longus to augment the repair.  The deep fascia was incised, the FHL tendon was isolated.  It was on tension and transected at the level of the bony canal taking care to protect the posterior tibial artery and tibial nerve.  The tendon end was then whip stitched.  A 6.2 mm drill hole was made in the calcaneus over a guidewire just adjacent to the Achilles. The tendon was then pulled down into the hole with a Beath pin.  A 6 mm x 20 mm Biomet Rattler screw was then inserted.  It was noted to have excellent purchase.  The 2 ends of the  Achilles were then repaired with #2 FiberWire placed in [place] fashion.  A piece of allograft dermal matrix was then wrapped around the tissue repair and secured with 3-0 Monocryl simple and running sutures.  The wound was then irrigated copiously.  The paratenon and subcutaneous tissues were approximated with inverted simple sutures of 3-0 Monocryl.  Horizontal mattress and running sutures of 3-0 nylon were used to close the 2 skin incisions. Sterile dressings were applied followed by a well-padded short-leg splint with the ankle held in maximal plantar flexion.  The patient's tourniquet was released after application of the dressings at 88 minutes.  The patient was then awakened from anesthesia and transported to the recovery room in stable condition.  FOLLOWUP PLAN:  The patient will be nonweightbearing on the right lower extremity.  She will take aspirin for DVT prophylaxis and follow up with me in 2 weeks for suture removal and conversion to a cast.     [name], MD     JH/MEDQ  D:  06/27/2014  T:  06/28/2014  Job:  [id number]

## 2014-07-19 HISTORY — PX: CATARACT EXTRACTION W/ INTRAOCULAR LENS IMPLANT: SHX1309

## 2014-07-31 DIAGNOSIS — M66361 Spontaneous rupture of flexor tendons, right lower leg: Secondary | ICD-10-CM | POA: Diagnosis not present

## 2014-07-31 DIAGNOSIS — Z4789 Encounter for other orthopedic aftercare: Secondary | ICD-10-CM | POA: Diagnosis not present

## 2014-09-06 DIAGNOSIS — M66361 Spontaneous rupture of flexor tendons, right lower leg: Secondary | ICD-10-CM | POA: Diagnosis not present

## 2014-09-06 DIAGNOSIS — M25561 Pain in right knee: Secondary | ICD-10-CM | POA: Diagnosis not present

## 2014-09-19 DIAGNOSIS — M66361 Spontaneous rupture of flexor tendons, right lower leg: Secondary | ICD-10-CM | POA: Diagnosis not present

## 2014-09-27 DIAGNOSIS — M66361 Spontaneous rupture of flexor tendons, right lower leg: Secondary | ICD-10-CM | POA: Diagnosis not present

## 2014-10-04 DIAGNOSIS — M25561 Pain in right knee: Secondary | ICD-10-CM | POA: Diagnosis not present

## 2014-10-04 DIAGNOSIS — M7662 Achilles tendinitis, left leg: Secondary | ICD-10-CM | POA: Diagnosis not present

## 2014-10-07 DIAGNOSIS — M25561 Pain in right knee: Secondary | ICD-10-CM | POA: Diagnosis not present

## 2014-10-07 DIAGNOSIS — M1711 Unilateral primary osteoarthritis, right knee: Secondary | ICD-10-CM | POA: Diagnosis not present

## 2014-10-16 DIAGNOSIS — M25561 Pain in right knee: Secondary | ICD-10-CM | POA: Diagnosis not present

## 2014-10-18 DIAGNOSIS — M66361 Spontaneous rupture of flexor tendons, right lower leg: Secondary | ICD-10-CM | POA: Diagnosis not present

## 2014-10-23 DIAGNOSIS — M7662 Achilles tendinitis, left leg: Secondary | ICD-10-CM | POA: Diagnosis not present

## 2014-10-23 DIAGNOSIS — M6702 Short Achilles tendon (acquired), left ankle: Secondary | ICD-10-CM | POA: Diagnosis not present

## 2014-10-30 DIAGNOSIS — M1711 Unilateral primary osteoarthritis, right knee: Secondary | ICD-10-CM | POA: Diagnosis not present

## 2014-11-06 DIAGNOSIS — M1711 Unilateral primary osteoarthritis, right knee: Secondary | ICD-10-CM | POA: Diagnosis not present

## 2014-11-13 DIAGNOSIS — M1711 Unilateral primary osteoarthritis, right knee: Secondary | ICD-10-CM | POA: Diagnosis not present

## 2014-11-18 DIAGNOSIS — L821 Other seborrheic keratosis: Secondary | ICD-10-CM | POA: Diagnosis not present

## 2014-11-18 DIAGNOSIS — D1801 Hemangioma of skin and subcutaneous tissue: Secondary | ICD-10-CM | POA: Diagnosis not present

## 2014-11-18 DIAGNOSIS — L820 Inflamed seborrheic keratosis: Secondary | ICD-10-CM | POA: Diagnosis not present

## 2014-11-25 ENCOUNTER — Encounter (HOSPITAL_BASED_OUTPATIENT_CLINIC_OR_DEPARTMENT_OTHER): Payer: Self-pay | Admitting: *Deleted

## 2014-11-25 DIAGNOSIS — Z1231 Encounter for screening mammogram for malignant neoplasm of breast: Secondary | ICD-10-CM | POA: Diagnosis not present

## 2014-11-27 ENCOUNTER — Encounter (HOSPITAL_BASED_OUTPATIENT_CLINIC_OR_DEPARTMENT_OTHER): Admission: RE | Admit: 2014-11-27 | Payer: Medicare Other | Source: Ambulatory Visit

## 2014-11-27 DIAGNOSIS — Z79891 Long term (current) use of opiate analgesic: Secondary | ICD-10-CM | POA: Diagnosis not present

## 2014-11-27 DIAGNOSIS — M1990 Unspecified osteoarthritis, unspecified site: Secondary | ICD-10-CM | POA: Diagnosis not present

## 2014-11-27 DIAGNOSIS — M7662 Achilles tendinitis, left leg: Secondary | ICD-10-CM | POA: Diagnosis not present

## 2014-11-27 DIAGNOSIS — Z9889 Other specified postprocedural states: Secondary | ICD-10-CM | POA: Diagnosis not present

## 2014-11-27 DIAGNOSIS — Z7982 Long term (current) use of aspirin: Secondary | ICD-10-CM | POA: Diagnosis not present

## 2014-11-27 DIAGNOSIS — Z79899 Other long term (current) drug therapy: Secondary | ICD-10-CM | POA: Diagnosis not present

## 2014-11-27 DIAGNOSIS — E039 Hypothyroidism, unspecified: Secondary | ICD-10-CM | POA: Diagnosis not present

## 2014-11-27 DIAGNOSIS — M6702 Short Achilles tendon (acquired), left ankle: Secondary | ICD-10-CM | POA: Diagnosis not present

## 2014-11-27 DIAGNOSIS — E785 Hyperlipidemia, unspecified: Secondary | ICD-10-CM | POA: Diagnosis not present

## 2014-11-27 DIAGNOSIS — I10 Essential (primary) hypertension: Secondary | ICD-10-CM | POA: Diagnosis not present

## 2014-11-27 LAB — BASIC METABOLIC PANEL
Anion gap: 7 (ref 5–15)
BUN: 25 mg/dL — AB (ref 6–20)
CALCIUM: 10 mg/dL (ref 8.9–10.3)
CO2: 27 mmol/L (ref 22–32)
Chloride: 103 mmol/L (ref 101–111)
Creatinine, Ser: 0.78 mg/dL (ref 0.44–1.00)
GFR calc Af Amer: >60 mL/min (ref >60)
Glucose, Bld: 102 mg/dL — ABNORMAL HIGH (ref 70–99)
POTASSIUM: 4.2 mmol/L (ref 3.5–5.1)
Sodium: 137 mmol/L (ref 135–145)

## 2014-11-27 NOTE — Anesthesia Preprocedure Evaluation (Addendum)
Anesthesia Evaluation  Patient identified by MRN, date of birth, ID band Patient awake    Reviewed: Allergy & Precautions, NPO status , Patient's Chart, lab work & pertinent test results  Airway Mallampati: II  TM Distance: >3 FB Neck ROM: Full    Dental no notable dental hx.    Pulmonary neg pulmonary ROS,  breath sounds clear to auscultation  Pulmonary exam normal       Cardiovascular hypertension, Pt. on medications Normal cardiovascular examRhythm:Regular Rate:Normal     Neuro/Psych negative neurological ROS  negative psych ROS   GI/Hepatic negative GI ROS, Neg liver ROS,   Endo/Other  Hypothyroidism   Renal/GU negative Renal ROS  negative genitourinary   Musculoskeletal negative musculoskeletal ROS (+)   Abdominal   Peds negative pediatric ROS (+)  Hematology negative hematology ROS (+)   Anesthesia Other Findings   Reproductive/Obstetrics negative OB ROS                            Anesthesia Physical Anesthesia Plan  ASA: II  Anesthesia Plan: General   Post-op Pain Management:    Induction: Intravenous  Airway Management Planned: Oral ETT and LMA  Additional Equipment:   Intra-op Plan:   Post-operative Plan: Extubation in OR  Informed Consent: I have reviewed the patients History and Physical, chart, labs and discussed the procedure including the risks, benefits and alternatives for the proposed anesthesia with the patient or authorized representative who has indicated his/her understanding and acceptance.   Dental advisory given  Plan Discussed with: CRNA and Surgeon  Anesthesia Plan Comments:         Anesthesia Quick Evaluation

## 2014-11-28 ENCOUNTER — Encounter (HOSPITAL_BASED_OUTPATIENT_CLINIC_OR_DEPARTMENT_OTHER): Payer: Self-pay | Admitting: Certified Registered"

## 2014-11-28 ENCOUNTER — Ambulatory Visit (HOSPITAL_BASED_OUTPATIENT_CLINIC_OR_DEPARTMENT_OTHER)
Admission: RE | Admit: 2014-11-28 | Discharge: 2014-11-28 | Disposition: A | Discharge status: Home / Self Care | Payer: Medicare Other | Source: Ambulatory Visit | Attending: Orthopedic Surgery | Admitting: Orthopedic Surgery

## 2014-11-28 ENCOUNTER — Ambulatory Visit (HOSPITAL_BASED_OUTPATIENT_CLINIC_OR_DEPARTMENT_OTHER): Payer: Medicare Other | Admitting: Certified Registered"

## 2014-11-28 ENCOUNTER — Encounter (HOSPITAL_BASED_OUTPATIENT_CLINIC_OR_DEPARTMENT_OTHER)
Admission: RE | Disposition: A | Discharge status: Home / Self Care | Payer: Self-pay | Source: Ambulatory Visit | Attending: Orthopedic Surgery

## 2014-11-28 DIAGNOSIS — I10 Essential (primary) hypertension: Secondary | ICD-10-CM | POA: Diagnosis not present

## 2014-11-28 DIAGNOSIS — Z79891 Long term (current) use of opiate analgesic: Secondary | ICD-10-CM | POA: Insufficient documentation

## 2014-11-28 DIAGNOSIS — E785 Hyperlipidemia, unspecified: Secondary | ICD-10-CM | POA: Diagnosis not present

## 2014-11-28 DIAGNOSIS — M1990 Unspecified osteoarthritis, unspecified site: Secondary | ICD-10-CM | POA: Insufficient documentation

## 2014-11-28 DIAGNOSIS — Z7982 Long term (current) use of aspirin: Secondary | ICD-10-CM | POA: Insufficient documentation

## 2014-11-28 DIAGNOSIS — M6702 Short Achilles tendon (acquired), left ankle: Secondary | ICD-10-CM | POA: Insufficient documentation

## 2014-11-28 DIAGNOSIS — M7662 Achilles tendinitis, left leg: Secondary | ICD-10-CM | POA: Insufficient documentation

## 2014-11-28 DIAGNOSIS — Z79899 Other long term (current) drug therapy: Secondary | ICD-10-CM | POA: Diagnosis not present

## 2014-11-28 DIAGNOSIS — E039 Hypothyroidism, unspecified: Secondary | ICD-10-CM | POA: Diagnosis not present

## 2014-11-28 DIAGNOSIS — G8918 Other acute postprocedural pain: Secondary | ICD-10-CM | POA: Diagnosis not present

## 2014-11-28 DIAGNOSIS — M79662 Pain in left lower leg: Secondary | ICD-10-CM | POA: Diagnosis not present

## 2014-11-28 DIAGNOSIS — Z9889 Other specified postprocedural states: Secondary | ICD-10-CM | POA: Diagnosis not present

## 2014-11-28 HISTORY — PX: GASTROC RECESSION EXTREMITY: SHX6262

## 2014-11-28 HISTORY — PX: ACHILLES TENDON SURGERY: SHX542

## 2014-11-28 SURGERY — REPAIR, TENDON, ACHILLES
Anesthesia: General | Site: Leg Lower | Laterality: Left

## 2014-11-28 MED ORDER — FENTANYL CITRATE (PF) 100 MCG/2ML IJ SOLN
25.0000 ug | INTRAMUSCULAR | Status: DC | PRN
  Administered 2014-11-28: 25 ug via INTRAVENOUS
  Administered 2014-11-28: 50 ug via INTRAVENOUS
  Administered 2014-11-28 (×3): 25 ug via INTRAVENOUS

## 2014-11-28 MED ORDER — SODIUM CHLORIDE 0.9 % IV SOLN: INTRAVENOUS | Status: DC

## 2014-11-28 MED ORDER — FENTANYL CITRATE (PF) 100 MCG/2ML IJ SOLN
INTRAMUSCULAR | Status: AC
  Filled 2014-11-28: qty 2

## 2014-11-28 MED ORDER — FENTANYL CITRATE (PF) 100 MCG/2ML IJ SOLN
50.0000 ug | INTRAMUSCULAR | Status: DC | PRN
  Administered 2014-11-28: 50 ug via INTRAVENOUS

## 2014-11-28 MED ORDER — SUCCINYLCHOLINE CHLORIDE 20 MG/ML IJ SOLN
INTRAMUSCULAR | Status: DC | PRN
  Administered 2014-11-28: 100 mg via INTRAVENOUS

## 2014-11-28 MED ORDER — ROPIVACAINE HCL 5 MG/ML IJ SOLN
INTRAMUSCULAR | Status: DC | PRN
  Administered 2014-11-28: 25 mL via PERINEURAL

## 2014-11-28 MED ORDER — ONDANSETRON HCL 4 MG/2ML IJ SOLN
INTRAMUSCULAR | Status: DC | PRN
Start: 2014-11-28 — End: 2014-11-28
  Administered 2014-11-28: 4 mg via INTRAVENOUS

## 2014-11-28 MED ORDER — GLYCOPYRROLATE 0.2 MG/ML IJ SOLN: 0.2000 mg | Freq: Once | INTRAMUSCULAR | Status: DC | PRN

## 2014-11-28 MED ORDER — CEFAZOLIN SODIUM-DEXTROSE 2-3 GM-% IV SOLR
2.0000 g | INTRAVENOUS | Status: AC
  Administered 2014-11-28: 2 g via INTRAVENOUS

## 2014-11-28 MED ORDER — OXYCODONE HCL 5 MG PO TABS: 5.0000 mg | ORAL_TABLET | ORAL | Status: DC | PRN

## 2014-11-28 MED ORDER — KETOROLAC TROMETHAMINE 30 MG/ML IJ SOLN
30.0000 mg | Freq: Once | INTRAMUSCULAR | Status: AC | PRN
  Administered 2014-11-28: 30 mg via INTRAVENOUS

## 2014-11-28 MED ORDER — HYDROMORPHONE HCL 1 MG/ML IJ SOLN
INTRAMUSCULAR | Status: AC
  Filled 2014-11-28: qty 1

## 2014-11-28 MED ORDER — SUCCINYLCHOLINE CHLORIDE 20 MG/ML IJ SOLN
INTRAMUSCULAR | Status: AC
  Filled 2014-11-28: qty 1

## 2014-11-28 MED ORDER — MIDAZOLAM HCL 2 MG/2ML IJ SOLN
1.0000 mg | INTRAMUSCULAR | Status: DC | PRN
  Administered 2014-11-28: 1 mg via INTRAVENOUS

## 2014-11-28 MED ORDER — FENTANYL CITRATE (PF) 100 MCG/2ML IJ SOLN
INTRAMUSCULAR | Status: AC
  Filled 2014-11-28: qty 6

## 2014-11-28 MED ORDER — KETOROLAC TROMETHAMINE 30 MG/ML IJ SOLN
INTRAMUSCULAR | Status: AC
  Filled 2014-11-28: qty 1

## 2014-11-28 MED ORDER — DEXAMETHASONE SODIUM PHOSPHATE 4 MG/ML IJ SOLN
INTRAMUSCULAR | Status: DC | PRN
  Administered 2014-11-28: 10 mg via INTRAVENOUS

## 2014-11-28 MED ORDER — CHLORHEXIDINE GLUCONATE 4 % EX LIQD: 60.0000 mL | Freq: Once | CUTANEOUS | Status: DC

## 2014-11-28 MED ORDER — FENTANYL CITRATE (PF) 100 MCG/2ML IJ SOLN
INTRAMUSCULAR | Status: DC | PRN
  Administered 2014-11-28 (×4): 25 ug via INTRAVENOUS

## 2014-11-28 MED ORDER — EPHEDRINE SULFATE 50 MG/ML IJ SOLN
INTRAMUSCULAR | Status: DC | PRN
  Administered 2014-11-28: 10 mg via INTRAVENOUS

## 2014-11-28 MED ORDER — CEFAZOLIN SODIUM-DEXTROSE 2-3 GM-% IV SOLR
INTRAVENOUS | Status: AC
  Filled 2014-11-28: qty 50

## 2014-11-28 MED ORDER — MIDAZOLAM HCL 2 MG/2ML IJ SOLN
INTRAMUSCULAR | Status: AC
  Filled 2014-11-28: qty 2

## 2014-11-28 MED ORDER — LIDOCAINE HCL (CARDIAC) 20 MG/ML IV SOLN
INTRAVENOUS | Status: DC | PRN
  Administered 2014-11-28: 30 mg via INTRAVENOUS

## 2014-11-28 MED ORDER — PROMETHAZINE HCL 25 MG/ML IJ SOLN: 6.2500 mg | INTRAMUSCULAR | Status: DC | PRN

## 2014-11-28 MED ORDER — PROPOFOL 500 MG/50ML IV EMUL
INTRAVENOUS | Status: AC
  Filled 2014-11-28: qty 50

## 2014-11-28 MED ORDER — PROPOFOL 10 MG/ML IV BOLUS
INTRAVENOUS | Status: DC | PRN
  Administered 2014-11-28: 100 mg via INTRAVENOUS

## 2014-11-28 MED ORDER — HYDROMORPHONE HCL 1 MG/ML IJ SOLN
0.2500 mg | INTRAMUSCULAR | Status: AC | PRN
  Administered 2014-11-28: 0.5 mg via INTRAVENOUS
  Administered 2014-11-28 (×3): 0.25 mg via INTRAVENOUS

## 2014-11-28 MED ORDER — LACTATED RINGERS IV SOLN
INTRAVENOUS | Status: DC
  Administered 2014-11-28: 09:00:00 via INTRAVENOUS
  Administered 2014-11-28: 10 mL/h via INTRAVENOUS
  Administered 2014-11-28: 08:00:00 via INTRAVENOUS

## 2014-11-28 SURGICAL SUPPLY — 88 items
BAG DECANTER FOR FLEXI CONT (MISCELLANEOUS) ×5 IMPLANT
BANDAGE ESMARK 6X9 LF (GAUZE/BANDAGES/DRESSINGS) ×3 IMPLANT
BLADE ARTHRO LOK 4 BEAVER (BLADE) IMPLANT
BLADE ARTHRO LOK 4MM BEAVER (BLADE)
BLADE AVERAGE 25MMX9MM (BLADE)
BLADE AVERAGE 25X9 (BLADE) IMPLANT
BLADE OSC/SAG .038X5.5 CUT EDG (BLADE) IMPLANT
BLADE SURG 15 STRL LF DISP TIS (BLADE) ×9 IMPLANT
BLADE SURG 15 STRL SS (BLADE) ×6
BNDG CMPR 9X6 STRL LF SNTH (GAUZE/BANDAGES/DRESSINGS) ×2
BNDG COHESIVE 4X5 TAN STRL (GAUZE/BANDAGES/DRESSINGS) ×5 IMPLANT
BNDG COHESIVE 6X5 TAN STRL LF (GAUZE/BANDAGES/DRESSINGS) ×5 IMPLANT
BNDG CONFORM 2 STRL LF (GAUZE/BANDAGES/DRESSINGS) IMPLANT
BNDG ESMARK 6X9 LF (GAUZE/BANDAGES/DRESSINGS) ×5
BOOT STEPPER DURA MED (SOFTGOODS) ×5 IMPLANT
CANISTER SUCT 1200ML W/VALVE (MISCELLANEOUS) ×5 IMPLANT
CHLORAPREP W/TINT 26ML (MISCELLANEOUS) ×5 IMPLANT
CLOSURE WOUND 1/2 X4 (GAUZE/BANDAGES/DRESSINGS)
COVER BACK TABLE 60X90IN (DRAPES) ×5 IMPLANT
COVER MAYO STAND STRL (DRAPES) IMPLANT
CUFF TOURNIQUET SINGLE 34IN LL (TOURNIQUET CUFF) ×5 IMPLANT
DECANTER SPIKE VIAL GLASS SM (MISCELLANEOUS) IMPLANT
DRAPE EXTREMITY T 121X128X90 (DRAPE) ×5 IMPLANT
DRAPE OEC MINIVIEW 54X84 (DRAPES) ×5 IMPLANT
DRAPE U-SHAPE 47X51 STRL (DRAPES) ×5 IMPLANT
DRSG ADAPTIC 3X8 NADH LF (GAUZE/BANDAGES/DRESSINGS) IMPLANT
DRSG EMULSION OIL 3X3 NADH (GAUZE/BANDAGES/DRESSINGS) ×10 IMPLANT
DRSG MEPITEL 4X7.2 (GAUZE/BANDAGES/DRESSINGS) IMPLANT
DRSG PAD ABDOMINAL 8X10 ST (GAUZE/BANDAGES/DRESSINGS) ×10 IMPLANT
DRSG TEGADERM 2-3/8X2-3/4 SM (GAUZE/BANDAGES/DRESSINGS) IMPLANT
ELECT REM PT RETURN 9FT ADLT (ELECTROSURGICAL) ×5
ELECTRODE REM PT RTRN 9FT ADLT (ELECTROSURGICAL) ×3 IMPLANT
GAUZE SPONGE 4X4 12PLY STRL (GAUZE/BANDAGES/DRESSINGS) ×5 IMPLANT
GLOVE BIO SURGEON STRL SZ8 (GLOVE) ×5 IMPLANT
GLOVE BIOGEL PI IND STRL 7.0 (GLOVE) ×3 IMPLANT
GLOVE BIOGEL PI IND STRL 8 (GLOVE) ×3 IMPLANT
GLOVE BIOGEL PI INDICATOR 7.0 (GLOVE) ×2
GLOVE BIOGEL PI INDICATOR 8 (GLOVE) ×2
GLOVE ECLIPSE 6.5 STRL STRAW (GLOVE) ×5 IMPLANT
GLOVE EXAM NITRILE MD LF STRL (GLOVE) IMPLANT
GOWN STRL REUS W/ TWL LRG LVL3 (GOWN DISPOSABLE) ×3 IMPLANT
GOWN STRL REUS W/ TWL XL LVL3 (GOWN DISPOSABLE) ×3 IMPLANT
GOWN STRL REUS W/TWL LRG LVL3 (GOWN DISPOSABLE) ×3
GOWN STRL REUS W/TWL XL LVL3 (GOWN DISPOSABLE) ×4
KIT BIO-TENODESIS 3X8 DISP (MISCELLANEOUS)
KIT INSRT BABSR STRL DISP BTN (MISCELLANEOUS) IMPLANT
NDL SAFETY ECLIPSE 18X1.5 (NEEDLE) IMPLANT
NDL SUT 6 .5 CRC .975X.05 MAYO (NEEDLE) IMPLANT
NEEDLE HYPO 18GX1.5 SHARP (NEEDLE)
NEEDLE HYPO 22GX1.5 SAFETY (NEEDLE) IMPLANT
NEEDLE MAYO TAPER (NEEDLE)
PACK BASIN DAY SURGERY FS (CUSTOM PROCEDURE TRAY) ×5 IMPLANT
PAD CAST 4YDX4 CTTN HI CHSV (CAST SUPPLIES) ×3 IMPLANT
PADDING CAST ABS 4INX4YD NS (CAST SUPPLIES)
PADDING CAST ABS COTTON 4X4 ST (CAST SUPPLIES) IMPLANT
PADDING CAST COTTON 4X4 STRL (CAST SUPPLIES) ×3
PADDING CAST COTTON 6X4 STRL (CAST SUPPLIES) ×5 IMPLANT
PASSER SUT SWANSON 36MM LOOP (INSTRUMENTS) IMPLANT
PENCIL BUTTON HOLSTER BLD 10FT (ELECTRODE) ×5 IMPLANT
RETRIEVER SUT HEWSON (MISCELLANEOUS) IMPLANT
SANITIZER HAND PURELL 535ML FO (MISCELLANEOUS) ×5 IMPLANT
SHEET MEDIUM DRAPE 40X70 STRL (DRAPES) ×5 IMPLANT
SLEEVE SCD COMPRESS KNEE MED (MISCELLANEOUS) ×5 IMPLANT
SPLINT FAST PLASTER 5X30 (CAST SUPPLIES) ×40
SPLINT PLASTER CAST FAST 5X30 (CAST SUPPLIES) ×60 IMPLANT
SPONGE LAP 18X18 X RAY DECT (DISPOSABLE) ×5 IMPLANT
STAPLER VISISTAT 35W (STAPLE) IMPLANT
STOCKINETTE 6  STRL (DRAPES) ×2
STOCKINETTE 6 STRL (DRAPES) ×3 IMPLANT
STRIP CLOSURE SKIN 1/2X4 (GAUZE/BANDAGES/DRESSINGS) IMPLANT
SUCTION FRAZIER TIP 10 FR DISP (SUCTIONS) ×5 IMPLANT
SUT 2 FIBERLOOP 20 STRT BLUE (SUTURE)
SUT ETHIBOND 2 OS 4 DA (SUTURE) IMPLANT
SUT ETHILON 3 0 PS 1 (SUTURE) ×5 IMPLANT
SUT FIBERWIRE #2 38 T-5 BLUE (SUTURE)
SUT MERSILENE 2.0 SH NDLE (SUTURE) IMPLANT
SUT MNCRL AB 3-0 PS2 18 (SUTURE) ×5 IMPLANT
SUT VIC AB 0 SH 27 (SUTURE) ×5 IMPLANT
SUT VIC AB 2-0 SH 27 (SUTURE) ×3
SUT VIC AB 2-0 SH 27XBRD (SUTURE) ×3 IMPLANT
SUT VICRYL 4-0 PS2 18IN ABS (SUTURE) IMPLANT
SUTURE 2 FIBERLOOP 20 STRT BLU (SUTURE) IMPLANT
SUTURE FIBERWR #2 38 T-5 BLUE (SUTURE) IMPLANT
SYR BULB 3OZ (MISCELLANEOUS) ×5 IMPLANT
TOWEL OR 17X24 6PK STRL BLUE (TOWEL DISPOSABLE) ×5 IMPLANT
TUBE CONNECTING 20'X1/4 (TUBING) ×1
TUBE CONNECTING 20X1/4 (TUBING) ×4 IMPLANT
UNDERPAD 30X30 (UNDERPADS AND DIAPERS) ×5 IMPLANT

## 2014-11-28 NOTE — Anesthesia Postprocedure Evaluation (Signed)
  Anesthesia Post-op Note  Patient: [name]  Procedure(s) Performed: Procedure(s) (LRB): LEFT ACHILLES DEBRIDEMENT AND REPAIR/GASTROC RECESSION (Left) GASTROC RECESSION EXTREMITY (Left)  Patient Location: PACU  Anesthesia Type: GA combined with regional for post-op pain  Level of Consciousness: awake and alert   Airway and Oxygen Therapy: Patient Spontanous Breathing  Post-op Pain: mild  Post-op Assessment: Post-op Vital signs reviewed, Patient's Cardiovascular Status Stable, Respiratory Function Stable, Patent Airway and No signs of Nausea or vomiting  Last Vitals:  Filed Vitals:   11/28/14 0900  BP: 127/70  Pulse: 74  Temp:   Resp: 15    Post-op Vital Signs: stable   Complications: No apparent anesthesia complications

## 2014-11-28 NOTE — Progress Notes (Signed)
Assisted Dr. Rose with left, ultrasound guided, popliteal/saphenous block. Side rails up, monitors on throughout procedure. See vital signs in flow sheet. Tolerated Procedure well. °

## 2014-11-28 NOTE — H&P (Signed)
[name] is an 74 y.o. female.   Chief Complaint: left achilles tendonitis HPI: 74 y/o female with tight left heelcord and chronic non insertional achilles tendonopathy.  She has failed non op treatment and presents now for surgery.  She denies any changes in her health since I saw her last.  Past Medical History  Diagnosis Date  . Hypertension   . Hypothyroidism   . Arthritis   . Wears glasses   . Hyperlipemia     Past Surgical History  Procedure Laterality Date  . Abdominal hysterectomy  1978  . Colonoscopy    . Eye surgery  [phone]    cataracts-both  . Tonsillectomy    . Achilles tendon surgery Right 06/27/2014    Procedure: RIGHT ACHILLES DEBRIDEMENT AND REPAIR WITH TRANSFER OF FLEXOR HALLUCIS LONGUS TENDON TO THE CALCANEUS  ;  Surgeon: [name], MD;  Location: [facility];  Service: Orthopedics;  Laterality: Right;    History reviewed. No pertinent family history. Social History:  reports that she has never smoked. She does not have any smokeless tobacco history on file. She reports that she does not drink alcohol or use illicit drugs.  Allergies: No Known Allergies  Medications Prior to Admission  Medication Sig Dispense Refill  . acetaminophen (TYLENOL) 325 MG tablet Take 650 mg by mouth every 6 (six) hours as needed.    [name] ascorbic acid (VITAMIN C) 500 MG tablet Take 500 mg by mouth daily.    [name] aspirin 81 MG tablet Take 81 mg by mouth daily.    . calcium carbonate (OS-CAL) 600 MG TABS tablet Take 600 mg by mouth 2 (two) times daily with a meal.    . Glucosamine-Chondroit-Vit C-Mn (GLUCOSAMINE CHONDR 500 COMPLEX) CAPS Take by mouth.    . levothyroxine (SYNTHROID, LEVOTHROID) 100 MCG tablet Take 100 mcg by mouth daily before breakfast.    . lisinopril (PRINIVIL,ZESTRIL) 10 MG tablet Take 10 mg by mouth daily.    . pravastatin (PRAVACHOL) 20 MG tablet Take 20 mg by mouth daily.    [name] oxyCODONE (ROXICODONE) 5 MG immediate release tablet Take 1  tablet (5 mg total) by mouth every 4 (four) hours as needed for moderate pain or severe pain. 30 tablet 0    Results for orders placed or performed during the hospital encounter of 11/28/14 (from the past 48 hour(s))  Basic metabolic panel     Status: Abnormal   Collection Time: 11/27/14 10:40 AM  Result Value Ref Range   Sodium 137 135 - 145 mmol/L   Potassium 4.2 3.5 - 5.1 mmol/L   Chloride 103 101 - 111 mmol/L   CO2 27 22 - 32 mmol/L   Glucose, Bld 102 (H) 70 - 99 mg/dL   BUN 25 (H) 6 - 20 mg/dL   Creatinine, Ser 0.78 0.44 - 1.00 mg/dL   Calcium 10.0 8.9 - 10.3 mg/dL   GFR calc non Af Amer >60 >60 mL/min   GFR calc Af Amer >60 >60 mL/min    Comment: (NOTE) The eGFR has been calculated using the CKD EPI equation. This calculation has not been validated in all clinical situations. eGFR's persistently <60 mL/min signify possible Chronic Kidney Disease.    Anion gap 7 5 - 15   No results found.  ROS  No recent f/c/n/v/wt loss  Blood pressure 111/56, pulse 65, temperature 98.7 F (37.1 C), temperature source Oral, resp. rate 13, height '5\' 7"'  (1.702 m), weight 76.204 kg (168 lb), SpO2 100 %.  Physical Exam  wn wd woman in  [place].  A and O x 4.  Mood and affect normal.  EOMI.  resp unlabored.  Skin healthy at left calf and leg posteriorly.  5/5 strength in PF at gastroc.  No lymphadenopathy.  Sens to LT intact in sural n dist.  TTP at nodule on achilles.  Assessment/Plan L non insertional achilles tendonopathy and tight heelcord - to OR for gastroc recession, achilles debridement and repair and possible FHL transfer to the calcaneus.  The risks and benefits of the alternative treatment options have been discussed in detail.  The patient wishes to proceed with surgery and specifically understands risks of bleeding, infection, nerve damage, blood clots, need for additional surgery, amputation and death.   [name] 2014/12/13, 7:24 AM

## 2014-11-28 NOTE — Brief Op Note (Signed)
11/28/2014  8:54 AM  PATIENT:  [name]  74 y.o. female  PRE-OPERATIVE DIAGNOSIS: 1.  Left achilles non insertional tendonopathy      2.  Left tight heelord  POST-OPERATIVE DIAGNOSIS:  Same  Procedure(s): 1.  LEFT ACHILLES DEBRIDEMENT AND REPAIR 2.  Left gastrocnemius RECESSION (separate incision)  SURGEON:  [name], MD  ASSISTANT: n/a  ANESTHESIA:   General, regional  EBL:  minimal   TOURNIQUET:   Total Tourniquet Time Documented: Thigh (Left) - 50 minutes Total: Thigh (Left) - 50 minutes  COMPLICATIONS:  None apparent  DISPOSITION:  Extubated, awake and stable to recovery.  DICTATION ID:  [id number]

## 2014-11-28 NOTE — Anesthesia Procedure Notes (Addendum)
Anesthesia Regional Block:  Popliteal block  Pre-Anesthetic Checklist: ,, timeout performed, Correct Patient, Correct Site, Correct Laterality, Correct Procedure, Correct Position, site marked, Risks and benefits discussed,  Surgical consent,  Pre-op evaluation,  At surgeon's request and post-op pain management  Laterality: Left  Prep: chloraprep       Needles:  Injection technique: Single-shot  Needle Type: Echogenic Stimulator Needle     Needle Length: 9cm 9 cm Needle Gauge: 21 and 21 G    Additional Needles:  Procedures: ultrasound guided (picture in chart) Popliteal block Narrative:  Injection made incrementally with aspirations every 5 mL.  Performed by: Personally   Additional Notes: Patient tolerated the procedure well without complications   Procedure Name: Intubation Date/Time: 11/28/2014 7:32 AM Performed by: Amier Hoyt D Pre-anesthesia Checklist: Patient identified, Emergency Drugs available, Suction available and Patient being monitored Patient Re-evaluated:Patient Re-evaluated prior to inductionOxygen Delivery Method: Circle System Utilized Preoxygenation: Pre-oxygenation with 100% oxygen Intubation Type: IV induction Ventilation: Mask ventilation without difficulty Laryngoscope Size: Mac and 3 Grade View: Grade I Tube type: Oral Tube size: 7.0 mm Number of attempts: 1 Airway Equipment and Method: Stylet and Oral airway Placement Confirmation: ETT inserted through vocal cords under direct vision,  positive ETCO2 and breath sounds checked- equal and bilateral Secured at: 21 cm Tube secured with: Tape Dental Injury: Teeth and Oropharynx as per pre-operative assessment

## 2014-11-28 NOTE — Discharge Instructions (Signed)
[name], MD [facility]  Please read the following information regarding your care after surgery.  Medications  You only need a prescription for the narcotic pain medicine (ex. oxycodone, Percocet, Norco).  All of the other medicines listed below are available over the counter. X acetominophen (Tylenol) 650 mg every 4-6 hours as you need for minor pain X oxycodone as prescribed for moderate to severe pain ?   Narcotic pain medicine (ex. oxycodone, Percocet, Vicodin) will cause constipation.  To prevent this problem, take the following medicines while you are taking any pain medicine. X docusate sodium (Colace) 100 mg twice a day X senna (Senokot) 2 tablets twice a day  Weight Bearing X Bear weight when you are able on your operated leg or foot in the CAM boot. ? Bear weight only on the heel of your operated foot in the post-op shoe. ? Do not bear any weight on the operated leg or foot.  Cast / Splint / Dressing X Keep your splint or cast clean and dry.  Dont put anything (coat hanger, pencil, etc) down inside of it.  If it gets damp, use a hair dryer on the cool setting to dry it.  If it gets soaked, call the office to schedule an appointment for a cast change. ? Remove your dressing 3 days after surgery and cover the incisions with dry dressings.    After your dressing, cast or splint is removed; you may shower, but do not soak or scrub the wound.  Allow the water to run over it, and then gently pat it dry.  Swelling It is normal for you to have swelling where you had surgery.  To reduce swelling and pain, keep your toes above your nose for at least 3 days after surgery.  It may be necessary to keep your foot or leg elevated for several weeks.  If it hurts, it should be elevated.  Follow Up Call my office at [phone] when you are discharged from the hospital or surgery center to schedule an appointment to be seen two weeks after surgery.  Call my office at  [phone] if you develop a fever >101.5 F, nausea, vomiting, bleeding from the surgical site or severe pain.    Regional Anesthesia Blocks  1. Numbness or the inability to move the "blocked" extremity may last from 3-48 hours after placement. The length of time depends on the medication injected and your individual response to the medication. If the numbness is not going away after 48 hours, call your surgeon.  2. The extremity that is blocked will need to be protected until the numbness is gone and the  Strength has returned. Because you cannot feel it, you will need to take extra care to avoid injury. Because it may be weak, you may have difficulty moving it or using it. You may not know what position it is in without looking at it while the block is in effect.  3. For blocks in the legs and feet, returning to weight bearing and walking needs to be done carefully. You will need to wait until the numbness is entirely gone and the strength has returned. You should be able to move your leg and foot normally before you try and bear weight or walk. You will need someone to be with you when you first try to ensure you do not fall and possibly risk injury.  4. Bruising and tenderness at the needle site are common side effects and will resolve in a  few days.  5. Persistent numbness or new problems with movement should be communicated to the surgeon or the [facility] [phone] [place] [phone]).  Post Anesthesia Home Care Instructions  Activity: Get plenty of rest for the remainder of the day. A responsible adult should stay with you for 24 hours following the procedure.  For the next 24 hours, DO NOT: -Drive a car -Paediatric nurse -Drink alcoholic beverages -Take any medication unless instructed by your physician -Make any legal decisions or sign important papers.  Meals: Start with liquid foods such as gelatin or soup. Progress to regular foods as  tolerated. Avoid greasy, spicy, heavy foods. If nausea and/or vomiting occur, drink only clear liquids until the nausea and/or vomiting subsides. Call your physician if vomiting continues.  Special Instructions/Symptoms: Your throat may feel dry or sore from the anesthesia or the breathing tube placed in your throat during surgery. If this causes discomfort, gargle with warm salt water. The discomfort should disappear within 24 hours.  If you had a scopolamine patch placed behind your ear for the management of post- operative nausea and/or vomiting:  1. The medication in the patch is effective for 72 hours, after which it should be removed.  Wrap patch in a tissue and discard in the trash. Wash hands thoroughly with soap and water. 2. You may remove the patch earlier than 72 hours if you experience unpleasant side effects which may include dry mouth, dizziness or visual disturbances. 3. Avoid touching the patch. Wash your hands with soap and water after contact with the patch.

## 2014-11-28 NOTE — Transfer of Care (Signed)
Immediate Anesthesia Transfer of Care Note  Patient: [name]  Procedure(s) Performed: Procedure(s): LEFT ACHILLES DEBRIDEMENT AND REPAIR/GASTROC RECESSION (Left) GASTROC RECESSION EXTREMITY (Left)  Patient Location: PACU  Anesthesia Type:GA combined with regional for post-op pain  Level of Consciousness: awake, alert , oriented and patient cooperative  Airway & Oxygen Therapy: Patient Spontanous Breathing and Patient connected to face mask oxygen  Post-op Assessment: Report given to RN and Post -op Vital signs reviewed and stable  Post vital signs: Reviewed and stable  Last Vitals:  Filed Vitals:   11/28/14 0708  BP: 111/56  Pulse: 65  Temp:   Resp: 13    Complications: No apparent anesthesia complications

## 2014-11-28 NOTE — Op Note (Signed)
NAMENAARAH, BORGERDING             ACCOUNT NO.:  [id number]  MEDICAL RECORD NO.:  [id number]  LOCATION:                                FACILITY:  [facility]  PHYSICIAN:  [name], MD        DATE OF BIRTH:  06-15-1941  DATE OF PROCEDURE:  11/28/2014 DATE OF DISCHARGE:  11/28/2014                              OPERATIVE REPORT   PREOPERATIVE DIAGNOSES: 1. Left Achilles non-insertional tendinopathy. 2. Left tight heel cord.  POSTOPERATIVE DIAGNOSES: 1. Left Achilles non-insertional tendinopathy. 2. Left tight heel cord.  PROCEDURE: 1. Left Achilles tendon debridement and repair. 2. Left gastrocnemius recession through a separate incision.  SURGEON:  [name], MD  ANESTHESIA:  General, regional.  ESTIMATED BLOOD LOSS:  Minimal.  TOURNIQUET TIME:  50 Minutes at 250 mmHg.  COMPLICATIONS:  None apparent.  DISPOSITION:  Extubated, awake, and stable to recovery.  INDICATIONS FOR PROCEDURE:  The patient is a 74 year old woman who has a long history of non-insertional Achilles tendinopathy on the left.  She has failed nonoperative treatment today including activity modification, oral anti-inflammatories, a heel lift and extensive physical therapy. She presents now for debridement of the tendon and gastrocnemius recession as well as a possible FHL transfer.  She understands the risks and benefits of the alternative treatment options, and elects surgical treatment.  She specifically understands risks of bleeding, infection, nerve damage, blood clots, need for additional surgery, continued pain, amputation, and death.  PROCEDURE IN DETAIL:  After preoperative consent was obtained and the correct operative site was identified,  the patient was brought to the operating room supine on a stretcher.  General anesthesia was induced. Preoperative antibiotics were administered.  Surgical time-out was taken.  Left lower extremity was exsanguinated, the tourniquet was inflated to 250 mmHg.   The patient was then turned into the prone position on the operating table with all bony prominences padded well. Left lower extremity was prepped and draped in standard sterile fashion. A longitudinal incision was then made over the insertion of the gastrocnemius tendon.  Sharp dissection was carried down through skin and subcutaneous tissue.  Care was taken to protect the sural nerve and lesser saphenous vein.  The tendon was identified and was divided under direct vision in its entirety.  Wound was irrigated and closed with Monocryl and nylon.  Attention was then turned to the prominence at the Achilles tendon.  A longitudinal incision was made.  Sharp dissection was carried down through the skin and subcutaneous tissue as well as the paratenon. Paratenon was elevated medially and laterally exposing a large nodular deformity within the tendon itself.  The tendon fibers were split longitudinally.  The tendon was then debrided of all degenerated mucinous appearing material.  This left approximately 50% of the tendon intact.  As a result, the FHL transfer was not necessary.  Wound was then irrigated copiously.  Horizontal mattress sutures of 2-0 Vicryl were used to approximate the longitudinal split in the tendon.  The tendon was then repaired with a running 3-0Vicryl suture.  The paratenon was repaired with inverted simple sutures of 3-0 Monocryl, and the skin was closed with a running 3-0 nylon.  Sterile dressings  were applied followed by compression wrap.  The patient was then placed in a CAM walker boot.  The tourniquet was released at 50 minutes after application of the dressings.  The patient was awakened from anesthesia and transported to recovery room in stable condition.  FOLLOWUP PLAN:  The patient will be weightbearing as tolerated on the left lower extremity.  She will follow up with me in 2 weeks for suture removal and to initiate physical therapy.     [name],  MD     JH/MEDQ  D:  11/28/2014  T:  11/28/2014  Job:  [id number]

## 2014-11-29 ENCOUNTER — Encounter (HOSPITAL_BASED_OUTPATIENT_CLINIC_OR_DEPARTMENT_OTHER): Payer: Self-pay | Admitting: Orthopedic Surgery

## 2014-12-03 DIAGNOSIS — Z961 Presence of intraocular lens: Secondary | ICD-10-CM | POA: Diagnosis not present

## 2014-12-03 DIAGNOSIS — H01005 Unspecified blepharitis left lower eyelid: Secondary | ICD-10-CM | POA: Diagnosis not present

## 2014-12-30 DIAGNOSIS — M25561 Pain in right knee: Secondary | ICD-10-CM | POA: Diagnosis not present

## 2015-03-26 DIAGNOSIS — Z23 Encounter for immunization: Secondary | ICD-10-CM | POA: Diagnosis not present

## 2015-03-26 DIAGNOSIS — Z79899 Other long term (current) drug therapy: Secondary | ICD-10-CM | POA: Diagnosis not present

## 2015-03-26 DIAGNOSIS — E780 Pure hypercholesterolemia: Secondary | ICD-10-CM | POA: Diagnosis not present

## 2015-03-26 DIAGNOSIS — Z0000 Encounter for general adult medical examination without abnormal findings: Secondary | ICD-10-CM | POA: Diagnosis not present

## 2015-03-27 DIAGNOSIS — Z0000 Encounter for general adult medical examination without abnormal findings: Secondary | ICD-10-CM | POA: Diagnosis not present

## 2015-03-27 DIAGNOSIS — Z79899 Other long term (current) drug therapy: Secondary | ICD-10-CM | POA: Diagnosis not present

## 2015-03-31 DIAGNOSIS — M1711 Unilateral primary osteoarthritis, right knee: Secondary | ICD-10-CM | POA: Diagnosis not present

## 2015-04-08 DIAGNOSIS — B9689 Other specified bacterial agents as the cause of diseases classified elsewhere: Secondary | ICD-10-CM | POA: Diagnosis not present

## 2015-04-08 DIAGNOSIS — J019 Acute sinusitis, unspecified: Secondary | ICD-10-CM | POA: Diagnosis not present

## 2015-05-08 DIAGNOSIS — M1711 Unilateral primary osteoarthritis, right knee: Secondary | ICD-10-CM | POA: Diagnosis not present

## 2015-08-13 DIAGNOSIS — M1711 Unilateral primary osteoarthritis, right knee: Secondary | ICD-10-CM | POA: Diagnosis not present

## 2015-09-16 DIAGNOSIS — T466X1A Poisoning by antihyperlipidemic and antiarteriosclerotic drugs, accidental (unintentional), initial encounter: Secondary | ICD-10-CM | POA: Diagnosis not present

## 2015-09-16 DIAGNOSIS — M79672 Pain in left foot: Secondary | ICD-10-CM | POA: Diagnosis not present

## 2015-09-16 DIAGNOSIS — M79671 Pain in right foot: Secondary | ICD-10-CM | POA: Diagnosis not present

## 2015-09-16 DIAGNOSIS — M79605 Pain in left leg: Secondary | ICD-10-CM | POA: Diagnosis not present

## 2015-09-16 DIAGNOSIS — M79604 Pain in right leg: Secondary | ICD-10-CM | POA: Diagnosis not present

## 2015-10-14 DIAGNOSIS — M1711 Unilateral primary osteoarthritis, right knee: Secondary | ICD-10-CM | POA: Diagnosis not present

## 2015-10-14 DIAGNOSIS — M25561 Pain in right knee: Secondary | ICD-10-CM | POA: Diagnosis not present

## 2015-12-10 DIAGNOSIS — M1711 Unilateral primary osteoarthritis, right knee: Secondary | ICD-10-CM | POA: Diagnosis not present

## 2016-01-07 DIAGNOSIS — M1711 Unilateral primary osteoarthritis, right knee: Secondary | ICD-10-CM | POA: Diagnosis not present

## 2016-01-09 DIAGNOSIS — Z961 Presence of intraocular lens: Secondary | ICD-10-CM | POA: Diagnosis not present

## 2016-01-09 DIAGNOSIS — H5340 Unspecified visual field defects: Secondary | ICD-10-CM | POA: Diagnosis not present

## 2016-01-29 DIAGNOSIS — H53452 Other localized visual field defect, left eye: Secondary | ICD-10-CM | POA: Diagnosis not present

## 2016-01-29 DIAGNOSIS — H01005 Unspecified blepharitis left lower eyelid: Secondary | ICD-10-CM | POA: Diagnosis not present

## 2016-02-03 DIAGNOSIS — Z789 Other specified health status: Secondary | ICD-10-CM | POA: Diagnosis not present

## 2016-02-03 DIAGNOSIS — Z79899 Other long term (current) drug therapy: Secondary | ICD-10-CM | POA: Diagnosis not present

## 2016-02-03 DIAGNOSIS — Z0000 Encounter for general adult medical examination without abnormal findings: Secondary | ICD-10-CM | POA: Diagnosis not present

## 2016-02-03 DIAGNOSIS — E785 Hyperlipidemia, unspecified: Secondary | ICD-10-CM | POA: Diagnosis not present

## 2016-02-03 DIAGNOSIS — Z9181 History of falling: Secondary | ICD-10-CM | POA: Diagnosis not present

## 2016-02-03 DIAGNOSIS — Z1389 Encounter for screening for other disorder: Secondary | ICD-10-CM | POA: Diagnosis not present

## 2016-02-25 ENCOUNTER — Ambulatory Visit: Payer: Self-pay | Admitting: Orthopedic Surgery

## 2016-03-02 ENCOUNTER — Ambulatory Visit: Payer: Self-pay | Admitting: Orthopedic Surgery

## 2016-03-02 NOTE — H&P (Signed)
TOTAL KNEE ADMISSION H&P  Patient is being admitted for right total knee arthroplasty.  Subjective:  Chief Complaint:right knee pain.  HPI: [name], 75 y.o. female, has a history of pain and functional disability in the right knee due to arthritis and has failed non-surgical conservative treatments for greater than 12 weeks to includeNSAID's and/or analgesics, corticosteriod injections, viscosupplementation injections, flexibility and strengthening excercises, supervised PT with diminished ADL's post treatment, use of assistive devices and activity modification.  Onset of symptoms was gradual, starting >10 years ago with rapidlly worsening course since that time. The patient noted no past surgery on the right knee(s).  Patient currently rates pain in the right knee(s) at 10 out of 10 with activity. Patient has night pain, worsening of pain with activity and weight bearing, pain that interferes with activities of daily living, pain with passive range of motion, crepitus and joint swelling.  Patient has evidence of subchondral cysts, subchondral sclerosis, periarticular osteophytes and joint space narrowing by imaging studies. There is no active infection.  There are no active problems to display for this patient.  Past Medical History:  Diagnosis Date  . Arthritis   . Hyperlipemia   . Hypertension   . Hypothyroidism   . Wears glasses     Past Surgical History:  Procedure Laterality Date  . ABDOMINAL HYSTERECTOMY  1978  . ACHILLES TENDON SURGERY Right 06/27/2014   Procedure: RIGHT ACHILLES DEBRIDEMENT AND REPAIR WITH TRANSFER OF FLEXOR HALLUCIS LONGUS TENDON TO THE CALCANEUS  ;  Surgeon: [name], MD;  Location: [facility];  Service: Orthopedics;  Laterality: Right;  . ACHILLES TENDON SURGERY Left 11/28/2014   Procedure: LEFT ACHILLES DEBRIDEMENT AND REPAIR/GASTROC RECESSION;  Surgeon: [name], MD;  Location: [facility];  Service: Orthopedics;   Laterality: Left;  . COLONOSCOPY    . EYE SURGERY  2014,2015   cataracts-both  . GASTROC RECESSION EXTREMITY Left 11/28/2014   Procedure: GASTROC RECESSION EXTREMITY;  Surgeon: [name], MD;  Location: [facility];  Service: Orthopedics;  Laterality: Left;  . TONSILLECTOMY       (Not in a hospital admission) No Known Allergies  Social History  Substance Use Topics  . Smoking status: Never Smoker  . Smokeless tobacco: Not on file  . Alcohol use No    No family history on file.   Review of Systems  Constitutional: Negative.   HENT: Negative.   Eyes: Negative.   Cardiovascular: Negative.   Gastrointestinal: Negative.   Genitourinary: Negative.   Musculoskeletal: Positive for joint pain.  Skin: Negative.   Neurological: Negative.   Endo/Heme/Allergies: Negative.   Psychiatric/Behavioral: Negative.     Objective:  Physical Exam  Constitutional: She is oriented to person, place, and time. She appears well-developed and well-nourished.  HENT:  Head: Normocephalic and atraumatic.  Eyes: Conjunctivae and EOM are normal. Pupils are equal, round, and reactive to light.  Neck: Normal range of motion. Neck supple.  Cardiovascular: Normal rate, regular rhythm and intact distal pulses.   Respiratory: Effort normal and breath sounds normal. No respiratory distress.  GI: Soft. Bowel sounds are normal. She exhibits distension.  Genitourinary:  Genitourinary Comments: deferred  Musculoskeletal:       Right knee: She exhibits decreased range of motion, swelling and effusion. Tenderness found. Medial joint line and lateral joint line tenderness noted.  Neurological: She is alert and oriented to person, place, and time. She has normal reflexes.  Skin: Skin is warm and dry.  Psychiatric:  She has a normal mood and affect. Her behavior is normal. Judgment and thought content normal.    Vital signs in last 24 hours: @VSRANGES @  Labs:   Estimated body mass index is  26.31 kg/m as calculated from the following:   Height as of 11/28/14: 5\' 7"  (1.702 m).   Weight as of 11/28/14: 76.2 kg (168 lb).   Imaging Review Plain radiographs demonstrate severe degenerative joint disease of the right knee(s). The overall alignment ismild varus. The bone quality appears to be adequate for age and reported activity level.  Assessment/Plan:  End stage arthritis, right knee   The patient history, physical examination, clinical judgment of the provider and imaging studies are consistent with end stage degenerative joint disease of the right knee(s) and total knee arthroplasty is deemed medically necessary. The treatment options including medical management, injection therapy arthroscopy and arthroplasty were discussed at length. The risks and benefits of total knee arthroplasty were presented and reviewed. The risks due to aseptic loosening, infection, stiffness, patella tracking problems, thromboembolic complications and other imponderables were discussed. The patient acknowledged the explanation, agreed to proceed with the plan and consent was signed. Patient is being admitted for inpatient treatment for surgery, pain control, PT, OT, prophylactic antibiotics, VTE prophylaxis, progressive ambulation and ADL's and discharge planning. The patient is planning to be discharged home with home health services

## 2016-03-09 ENCOUNTER — Encounter (HOSPITAL_COMMUNITY): Payer: Self-pay

## 2016-03-09 ENCOUNTER — Encounter (HOSPITAL_COMMUNITY)
Admission: RE | Admit: 2016-03-09 | Discharge: 2016-03-09 | Disposition: A | Discharge status: Home / Self Care | Payer: Medicare Other | Source: Ambulatory Visit | Attending: Orthopedic Surgery | Admitting: Orthopedic Surgery

## 2016-03-09 DIAGNOSIS — E039 Hypothyroidism, unspecified: Secondary | ICD-10-CM | POA: Diagnosis not present

## 2016-03-09 DIAGNOSIS — M1711 Unilateral primary osteoarthritis, right knee: Secondary | ICD-10-CM | POA: Diagnosis not present

## 2016-03-09 DIAGNOSIS — I10 Essential (primary) hypertension: Secondary | ICD-10-CM | POA: Diagnosis not present

## 2016-03-09 DIAGNOSIS — E785 Hyperlipidemia, unspecified: Secondary | ICD-10-CM | POA: Diagnosis not present

## 2016-03-09 HISTORY — DX: Other specified postprocedural states: Z98.890

## 2016-03-09 HISTORY — DX: Nausea with vomiting, unspecified: R11.2

## 2016-03-09 LAB — CBC
HEMATOCRIT: 36.8 % (ref 36.0–46.0)
Hemoglobin: 12.3 g/dL (ref 12.0–15.0)
MCH: 30.5 pg (ref 26.0–34.0)
MCHC: 33.4 g/dL (ref 30.0–36.0)
MCV: 91.3 fL (ref 78.0–100.0)
Platelets: 234 10*3/uL (ref 150–400)
RBC: 4.03 MIL/uL (ref 3.87–5.11)
RDW: 12.7 % (ref 11.5–15.5)
WBC: 6.9 10*3/uL (ref 4.0–10.5)

## 2016-03-09 LAB — BASIC METABOLIC PANEL
Anion gap: 8 (ref 5–15)
BUN: 27 mg/dL — AB (ref 6–20)
CHLORIDE: 104 mmol/L (ref 101–111)
CO2: 28 mmol/L (ref 22–32)
Calcium: 10.1 mg/dL (ref 8.9–10.3)
Creatinine, Ser: 0.8 mg/dL (ref 0.44–1.00)
GFR calc Af Amer: >60 mL/min (ref >60)
GFR calc non Af Amer: >60 mL/min (ref >60)
GLUCOSE: 103 mg/dL — AB (ref 65–99)
POTASSIUM: 4 mmol/L (ref 3.5–5.1)
Sodium: 140 mmol/L (ref 135–145)

## 2016-03-09 LAB — SURGICAL PCR SCREEN
MRSA, PCR: NEGATIVE
Staphylococcus aureus: NEGATIVE

## 2016-03-09 LAB — ABO/RH: ABO/RH(D): A POS

## 2016-03-09 NOTE — Patient Instructions (Signed)
[name]  03/09/2016   Your procedure is scheduled on: 03/11/16  Report to [facility] Main  Entrance take [facility]  elevators to 3rd floor to  [employer] at 10:45 AM.  Call this number if you have problems the morning of surgery [phone]   Remember: ONLY 1 PERSON MAY GO WITH YOU TO SHORT STAY TO GET  READY MORNING OF [place].  Do not eat food or drink liquids :After Midnight Wed     Take these medicines the morning of surgery with A SIP OF WATER: Levothyroxine                                You may not have any metal on your body including hair pins and              piercings  Do not wear jewelry, make-up, lotions, powders or perfumes, deodorant             Do not wear nail polish.  Do not shave  48 hours prior to surgery.                 Do not bring valuables to the hospital. [place].  Contacts, dentures or bridgework may not be worn into surgery.  Leave suitcase in the car. After surgery it may be brought to your room.    _____________________________________________________________________             [facility] - Preparing for Surgery  Before surgery, you can play an important role.  Because skin is not sterile, your skin needs to be as free of germs as possible.  You can reduce the number of germs on you skin by washing with CHG (chlorahexidine gluconate) soap before surgery.  CHG is an antiseptic cleaner which kills germs and bonds with the skin to continue killing germs even after washing.  Please DO NOT use if you have an allergy to CHG or antibacterial soaps.  If your skin becomes reddened/irritated stop using the CHG and inform your nurse when you arrive at Short Stay.  Do not shave (including legs and underarms) for at least 48 hours prior to the first CHG shower.  You may shave your face.  Please follow these instructions carefully:   1.  Shower with CHG Soap the  night before surgery and the                                morning of Surgery.  2.  If you choose to wash your hair, wash your hair first as usual with your       normal shampoo.  3.  After you shampoo, rinse your hair and body thoroughly to remove the                      Shampoo.  4.  Use CHG as you would any other liquid soap.  You can apply chg directly       to the skin and wash gently with scrungie or a clean washcloth.  5.  Apply the CHG Soap to your body ONLY FROM THE NECK DOWN.  Do not use on open wounds or open sores.  Avoid contact with your eyes,       ears, mouth and genitals (private parts).  Wash genitals (private parts)       with your normal soap.  6.  Wash thoroughly, paying special attention to the area where your surgery        will be performed.  7.  Thoroughly rinse your body with warm water from the neck down.  8.  DO NOT shower/wash with your normal soap after using and rinsing off       the CHG Soap.  9.  Pat yourself dry with a clean towel.            10.  Wear clean pajamas.            11.  Place clean sheets on your bed the night of your first shower and do not        sleep with pets.  Day of Surgery  Do not apply any lotions/deoderants the morning of surgery.  Please wear clean clothes to the hospital/surgery center.

## 2016-03-11 ENCOUNTER — Encounter (HOSPITAL_COMMUNITY)
Admission: RE | Disposition: A | Discharge status: Home Health | Payer: Self-pay | Source: Ambulatory Visit | Attending: Orthopedic Surgery

## 2016-03-11 ENCOUNTER — Inpatient Hospital Stay (HOSPITAL_COMMUNITY)
Admission: RE | Admit: 2016-03-11 | Discharge: 2016-03-12 | DRG: 470 | Disposition: A | Discharge status: Home Health | Payer: Medicare Other | Source: Ambulatory Visit | Attending: Orthopedic Surgery | Admitting: Orthopedic Surgery

## 2016-03-11 ENCOUNTER — Inpatient Hospital Stay (HOSPITAL_COMMUNITY): Payer: Medicare Other | Admitting: Anesthesiology

## 2016-03-11 ENCOUNTER — Encounter (HOSPITAL_COMMUNITY): Payer: Self-pay | Admitting: *Deleted

## 2016-03-11 ENCOUNTER — Inpatient Hospital Stay (HOSPITAL_COMMUNITY): Payer: Medicare Other

## 2016-03-11 DIAGNOSIS — M1711 Unilateral primary osteoarthritis, right knee: Secondary | ICD-10-CM

## 2016-03-11 DIAGNOSIS — Z96651 Presence of right artificial knee joint: Secondary | ICD-10-CM | POA: Diagnosis not present

## 2016-03-11 DIAGNOSIS — I10 Essential (primary) hypertension: Secondary | ICD-10-CM | POA: Diagnosis not present

## 2016-03-11 DIAGNOSIS — E039 Hypothyroidism, unspecified: Secondary | ICD-10-CM | POA: Diagnosis present

## 2016-03-11 DIAGNOSIS — E785 Hyperlipidemia, unspecified: Secondary | ICD-10-CM | POA: Diagnosis present

## 2016-03-11 DIAGNOSIS — Z09 Encounter for follow-up examination after completed treatment for conditions other than malignant neoplasm: Secondary | ICD-10-CM

## 2016-03-11 DIAGNOSIS — M179 Osteoarthritis of knee, unspecified: Secondary | ICD-10-CM | POA: Diagnosis not present

## 2016-03-11 DIAGNOSIS — M25561 Pain in right knee: Secondary | ICD-10-CM | POA: Diagnosis present

## 2016-03-11 DIAGNOSIS — Z471 Aftercare following joint replacement surgery: Secondary | ICD-10-CM | POA: Diagnosis not present

## 2016-03-11 HISTORY — DX: Unilateral primary osteoarthritis, right knee: M17.11

## 2016-03-11 HISTORY — PX: KNEE ARTHROPLASTY: SHX992

## 2016-03-11 LAB — TYPE AND SCREEN
ABO/RH(D): A POS
ANTIBODY SCREEN: NEGATIVE

## 2016-03-11 SURGERY — ARTHROPLASTY, KNEE, TOTAL, USING IMAGELESS COMPUTER-ASSISTED NAVIGATION
Anesthesia: Spinal | Site: Knee | Laterality: Right

## 2016-03-11 MED ORDER — PHENOL 1.4 % MT LIQD
1.0000 | OROMUCOSAL | Status: DC | PRN
Start: 2016-03-11 — End: 2016-03-12

## 2016-03-11 MED ORDER — METOCLOPRAMIDE HCL 5 MG PO TABS
5.0000 mg | ORAL_TABLET | Freq: Three times a day (TID) | ORAL | Status: DC | PRN
  Administered 2016-03-12: 10 mg via ORAL
  Filled 2016-03-11: qty 2

## 2016-03-11 MED ORDER — ONDANSETRON HCL 4 MG/2ML IJ SOLN
INTRAMUSCULAR | Status: DC | PRN
  Administered 2016-03-11: 4 mg via INTRAVENOUS

## 2016-03-11 MED ORDER — HYDROGEN PEROXIDE 3 % EX SOLN
CUTANEOUS | Status: DC | PRN
  Administered 2016-03-11: 1 via TOPICAL

## 2016-03-11 MED ORDER — TRANEXAMIC ACID 1000 MG/10ML IV SOLN
1000.0000 mg | Freq: Once | INTRAVENOUS | Status: AC
  Administered 2016-03-11: 1000 mg via INTRAVENOUS
  Filled 2016-03-11: qty 10

## 2016-03-11 MED ORDER — METHOCARBAMOL 500 MG PO TABS
500.0000 mg | ORAL_TABLET | Freq: Four times a day (QID) | ORAL | Status: DC | PRN
  Administered 2016-03-11 – 2016-03-12 (×3): 500 mg via ORAL
  Filled 2016-03-11 (×3): qty 1

## 2016-03-11 MED ORDER — SODIUM CHLORIDE 0.9 % IR SOLN
Status: DC | PRN
  Administered 2016-03-11: 4000 mL

## 2016-03-11 MED ORDER — ONDANSETRON HCL 4 MG/2ML IJ SOLN: 4.0000 mg | Freq: Four times a day (QID) | INTRAMUSCULAR | Status: DC | PRN

## 2016-03-11 MED ORDER — LIP MEDEX EX OINT
TOPICAL_OINTMENT | CUTANEOUS | Status: DC | PRN
  Administered 2016-03-11: 1 via TOPICAL
  Filled 2016-03-11: qty 7

## 2016-03-11 MED ORDER — ACETAMINOPHEN 10 MG/ML IV SOLN
1000.0000 mg | INTRAVENOUS | Status: AC
  Administered 2016-03-11: 1000 mg via INTRAVENOUS

## 2016-03-11 MED ORDER — LISINOPRIL 10 MG PO TABS
10.0000 mg | ORAL_TABLET | Freq: Every day | ORAL | Status: DC
  Administered 2016-03-11: 10 mg via ORAL
  Filled 2016-03-11 (×2): qty 1

## 2016-03-11 MED ORDER — SODIUM CHLORIDE 0.9 % IJ SOLN
INTRAMUSCULAR | Status: AC
  Filled 2016-03-11: qty 50

## 2016-03-11 MED ORDER — ISOPROPYL ALCOHOL 70 % SOLN
Status: DC | PRN
  Administered 2016-03-11: 1 via TOPICAL

## 2016-03-11 MED ORDER — PROMETHAZINE HCL 25 MG/ML IJ SOLN: 6.2500 mg | INTRAMUSCULAR | Status: DC | PRN

## 2016-03-11 MED ORDER — HYDROCODONE-ACETAMINOPHEN 5-325 MG PO TABS
1.0000 | ORAL_TABLET | ORAL | Status: DC | PRN
  Administered 2016-03-11: 1 via ORAL
  Administered 2016-03-11: 2 via ORAL
  Administered 2016-03-12: 1 via ORAL
  Administered 2016-03-12: 2 via ORAL
  Administered 2016-03-12: 1 via ORAL
  Filled 2016-03-11 (×2): qty 2
  Filled 2016-03-11: qty 1
  Filled 2016-03-11: qty 2
  Filled 2016-03-11: qty 1

## 2016-03-11 MED ORDER — SODIUM CHLORIDE 0.9 % IJ SOLN
INTRAMUSCULAR | Status: DC | PRN
  Administered 2016-03-11: 29 mL

## 2016-03-11 MED ORDER — FENTANYL CITRATE (PF) 100 MCG/2ML IJ SOLN
INTRAMUSCULAR | Status: AC
  Filled 2016-03-11: qty 2

## 2016-03-11 MED ORDER — SORBITOL 70 % SOLN
30.0000 mL | Freq: Every day | Status: DC | PRN
  Filled 2016-03-11: qty 30

## 2016-03-11 MED ORDER — ACETAMINOPHEN 325 MG PO TABS: 650.0000 mg | ORAL_TABLET | Freq: Four times a day (QID) | ORAL | Status: DC | PRN

## 2016-03-11 MED ORDER — HYDROMORPHONE HCL 1 MG/ML IJ SOLN: 0.2500 mg | INTRAMUSCULAR | Status: DC | PRN

## 2016-03-11 MED ORDER — SODIUM CHLORIDE 0.9 % IR SOLN
Status: DC | PRN
  Administered 2016-03-11: 1000 mL

## 2016-03-11 MED ORDER — ONDANSETRON HCL 4 MG PO TABS
4.0000 mg | ORAL_TABLET | Freq: Four times a day (QID) | ORAL | Status: DC | PRN
  Administered 2016-03-11: 4 mg via ORAL
  Filled 2016-03-11: qty 1

## 2016-03-11 MED ORDER — PROPOFOL 500 MG/50ML IV EMUL
INTRAVENOUS | Status: DC | PRN
  Administered 2016-03-11: 75 ug/kg/min via INTRAVENOUS

## 2016-03-11 MED ORDER — METOCLOPRAMIDE HCL 5 MG/ML IJ SOLN: 5.0000 mg | Freq: Three times a day (TID) | INTRAMUSCULAR | Status: DC | PRN

## 2016-03-11 MED ORDER — LACTATED RINGERS IV SOLN
INTRAVENOUS | Status: DC
  Administered 2016-03-11 (×4): via INTRAVENOUS

## 2016-03-11 MED ORDER — MIDAZOLAM HCL 5 MG/5ML IJ SOLN
INTRAMUSCULAR | Status: DC | PRN
  Administered 2016-03-11 (×2): 1 mg via INTRAVENOUS

## 2016-03-11 MED ORDER — CEFAZOLIN IN D5W 1 GM/50ML IV SOLN
1.0000 g | Freq: Four times a day (QID) | INTRAVENOUS | Status: AC
  Administered 2016-03-11 – 2016-03-12 (×2): 1 g via INTRAVENOUS
  Filled 2016-03-11 (×2): qty 50

## 2016-03-11 MED ORDER — MENTHOL 3 MG MT LOZG: 1.0000 | LOZENGE | OROMUCOSAL | Status: DC | PRN

## 2016-03-11 MED ORDER — BUPIVACAINE-EPINEPHRINE (PF) 0.25% -1:200000 IJ SOLN
INTRAMUSCULAR | Status: AC
  Filled 2016-03-11: qty 30

## 2016-03-11 MED ORDER — ACETAMINOPHEN 10 MG/ML IV SOLN
INTRAVENOUS | Status: AC
  Filled 2016-03-11: qty 100

## 2016-03-11 MED ORDER — DEXAMETHASONE SODIUM PHOSPHATE 10 MG/ML IJ SOLN
INTRAMUSCULAR | Status: AC
  Filled 2016-03-11: qty 1

## 2016-03-11 MED ORDER — HYDROMORPHONE HCL 1 MG/ML IJ SOLN
0.5000 mg | INTRAMUSCULAR | Status: DC | PRN
  Administered 2016-03-11 – 2016-03-12 (×2): 0.5 mg via INTRAVENOUS
  Filled 2016-03-11 (×2): qty 1

## 2016-03-11 MED ORDER — CEFAZOLIN SODIUM-DEXTROSE 2-4 GM/100ML-% IV SOLN
INTRAVENOUS | Status: AC
  Filled 2016-03-11: qty 100

## 2016-03-11 MED ORDER — TRANEXAMIC ACID 1000 MG/10ML IV SOLN
1000.0000 mg | INTRAVENOUS | Status: AC
  Administered 2016-03-11: 1000 mg via INTRAVENOUS
  Filled 2016-03-11: qty 10

## 2016-03-11 MED ORDER — HYDROGEN PEROXIDE 3 % EX SOLN
CUTANEOUS | Status: AC
  Filled 2016-03-11: qty 473

## 2016-03-11 MED ORDER — SODIUM CHLORIDE 0.9 % IV SOLN: INTRAVENOUS | Status: DC

## 2016-03-11 MED ORDER — FENTANYL CITRATE (PF) 100 MCG/2ML IJ SOLN
INTRAMUSCULAR | Status: DC | PRN
  Administered 2016-03-11: 25 ug via INTRAVENOUS
  Administered 2016-03-11: 50 ug via INTRAVENOUS
  Administered 2016-03-11: 25 ug via INTRAVENOUS

## 2016-03-11 MED ORDER — PROPOFOL 10 MG/ML IV BOLUS
INTRAVENOUS | Status: AC
  Filled 2016-03-11: qty 40

## 2016-03-11 MED ORDER — CALCIUM CARBONATE 1250 (500 CA) MG PO TABS
1.0000 | ORAL_TABLET | Freq: Two times a day (BID) | ORAL | Status: DC
Start: 2016-03-12 — End: 2016-03-12
  Administered 2016-03-12: 500 mg via ORAL
  Filled 2016-03-11: qty 1

## 2016-03-11 MED ORDER — DEXAMETHASONE SODIUM PHOSPHATE 10 MG/ML IJ SOLN
INTRAMUSCULAR | Status: DC | PRN
  Administered 2016-03-11: 10 mg via INTRAVENOUS

## 2016-03-11 MED ORDER — KETOROLAC TROMETHAMINE 30 MG/ML IJ SOLN
INTRAMUSCULAR | Status: AC
  Filled 2016-03-11: qty 1

## 2016-03-11 MED ORDER — METHOCARBAMOL 1000 MG/10ML IJ SOLN
500.0000 mg | Freq: Four times a day (QID) | INTRAMUSCULAR | Status: DC | PRN
  Administered 2016-03-11: 500 mg via INTRAVENOUS
  Filled 2016-03-11: qty 550
  Filled 2016-03-11: qty 5

## 2016-03-11 MED ORDER — ONDANSETRON HCL 4 MG/2ML IJ SOLN
INTRAMUSCULAR | Status: AC
  Filled 2016-03-11: qty 2

## 2016-03-11 MED ORDER — BUPIVACAINE HCL (PF) 0.5 % IJ SOLN
INTRAMUSCULAR | Status: AC
  Filled 2016-03-11: qty 30

## 2016-03-11 MED ORDER — SODIUM CHLORIDE 0.9 % IV SOLN
INTRAVENOUS | Status: DC
  Administered 2016-03-11: 150 mL/h via INTRAVENOUS
  Administered 2016-03-12: 06:00:00 via INTRAVENOUS

## 2016-03-11 MED ORDER — POVIDONE-IODINE 10 % EX SWAB: 2.0000 "application " | Freq: Once | CUTANEOUS | Status: DC

## 2016-03-11 MED ORDER — ISOPROPYL ALCOHOL 70 % SOLN
Status: AC
  Filled 2016-03-11: qty 480

## 2016-03-11 MED ORDER — ASPIRIN 81 MG PO CHEW
81.0000 mg | CHEWABLE_TABLET | Freq: Two times a day (BID) | ORAL | Status: DC
  Administered 2016-03-12: 81 mg via ORAL
  Filled 2016-03-11: qty 1

## 2016-03-11 MED ORDER — LIDOCAINE HCL (CARDIAC) 20 MG/ML IV SOLN
INTRAVENOUS | Status: AC
  Filled 2016-03-11: qty 5

## 2016-03-11 MED ORDER — MIDAZOLAM HCL 2 MG/2ML IJ SOLN
INTRAMUSCULAR | Status: AC
  Filled 2016-03-11: qty 2

## 2016-03-11 MED ORDER — DIPHENHYDRAMINE HCL 12.5 MG/5ML PO ELIX: 12.5000 mg | ORAL_SOLUTION | ORAL | Status: DC | PRN

## 2016-03-11 MED ORDER — BUPIVACAINE HCL (PF) 0.5 % IJ SOLN
INTRAMUSCULAR | Status: DC | PRN
  Administered 2016-03-11: 3 mL

## 2016-03-11 MED ORDER — PROPOFOL 10 MG/ML IV BOLUS
INTRAVENOUS | Status: AC
  Filled 2016-03-11: qty 20

## 2016-03-11 MED ORDER — KETOROLAC TROMETHAMINE 30 MG/ML IJ SOLN
INTRAMUSCULAR | Status: DC | PRN
  Administered 2016-03-11: 30 mg

## 2016-03-11 MED ORDER — KETOROLAC TROMETHAMINE 15 MG/ML IJ SOLN
15.0000 mg | Freq: Four times a day (QID) | INTRAMUSCULAR | Status: DC
  Administered 2016-03-11 – 2016-03-12 (×3): 15 mg via INTRAVENOUS
  Filled 2016-03-11 (×3): qty 1

## 2016-03-11 MED ORDER — CEFAZOLIN SODIUM-DEXTROSE 2-4 GM/100ML-% IV SOLN
2.0000 g | INTRAVENOUS | Status: AC
  Administered 2016-03-11: 2 g via INTRAVENOUS

## 2016-03-11 MED ORDER — POLYETHYLENE GLYCOL 3350 17 G PO PACK: 17.0000 g | PACK | Freq: Every day | ORAL | Status: DC | PRN

## 2016-03-11 MED ORDER — BUPIVACAINE-EPINEPHRINE 0.25% -1:200000 IJ SOLN
INTRAMUSCULAR | Status: DC | PRN
  Administered 2016-03-11: 30 mL

## 2016-03-11 MED ORDER — POVIDONE-IODINE 10 % EX SOLN
CUTANEOUS | Status: DC | PRN
  Administered 2016-03-11: 1 via TOPICAL

## 2016-03-11 MED ORDER — MAGNESIUM CITRATE PO SOLN: 1.0000 | Freq: Once | ORAL | Status: DC | PRN

## 2016-03-11 MED ORDER — LEVOTHYROXINE SODIUM 100 MCG PO TABS
100.0000 ug | ORAL_TABLET | Freq: Every day | ORAL | Status: DC
  Administered 2016-03-12: 100 ug via ORAL
  Filled 2016-03-11: qty 1

## 2016-03-11 MED ORDER — STERILE WATER FOR IRRIGATION IR SOLN
Status: DC | PRN
  Administered 2016-03-11: 2000 mL

## 2016-03-11 MED ORDER — SENNA 8.6 MG PO TABS
2.0000 | ORAL_TABLET | Freq: Every day | ORAL | Status: DC
  Administered 2016-03-11: 17.2 mg via ORAL
  Filled 2016-03-11: qty 2

## 2016-03-11 MED ORDER — ACETAMINOPHEN 650 MG RE SUPP: 650.0000 mg | Freq: Four times a day (QID) | RECTAL | Status: DC | PRN

## 2016-03-11 MED ORDER — DEXAMETHASONE SODIUM PHOSPHATE 10 MG/ML IJ SOLN
10.0000 mg | Freq: Once | INTRAMUSCULAR | Status: AC
  Administered 2016-03-12: 10 mg via INTRAVENOUS
  Filled 2016-03-11: qty 1

## 2016-03-11 MED ORDER — VITAMIN C 500 MG PO TABS
500.0000 mg | ORAL_TABLET | Freq: Every day | ORAL | Status: DC
  Administered 2016-03-12: 500 mg via ORAL
  Filled 2016-03-11: qty 1

## 2016-03-11 MED ORDER — ALUM & MAG HYDROXIDE-SIMETH 200-200-20 MG/5ML PO SUSP: 30.0000 mL | ORAL | Status: DC | PRN

## 2016-03-11 MED ORDER — DOCUSATE SODIUM 100 MG PO CAPS
100.0000 mg | ORAL_CAPSULE | Freq: Two times a day (BID) | ORAL | Status: DC
  Administered 2016-03-11 – 2016-03-12 (×2): 100 mg via ORAL
  Filled 2016-03-11 (×2): qty 1

## 2016-03-11 SURGICAL SUPPLY — 66 items
BAG SPEC THK2 15X12 ZIP CLS (MISCELLANEOUS) ×1
BAG ZIPLOCK 12X15 (MISCELLANEOUS) ×3 IMPLANT
BANDAGE ACE 4X5 VEL STRL LF (GAUZE/BANDAGES/DRESSINGS) ×3 IMPLANT
BANDAGE ACE 6X5 VEL STRL LF (GAUZE/BANDAGES/DRESSINGS) ×3 IMPLANT
BANDAGE ESMARK 6X9 LF (GAUZE/BANDAGES/DRESSINGS) ×1 IMPLANT
BLADE SAW RECIPROCATING 77.5 (BLADE) ×3 IMPLANT
BNDG CMPR 9X6 STRL LF SNTH (GAUZE/BANDAGES/DRESSINGS) ×1
BNDG ESMARK 6X9 LF (GAUZE/BANDAGES/DRESSINGS) ×3
CAPT KNEE TRIATH TK-4 ×3 IMPLANT
CHLORAPREP W/TINT 26ML (MISCELLANEOUS) ×6 IMPLANT
CUFF TOURN SGL QUICK 34 (TOURNIQUET CUFF) ×3
CUFF TRNQT CYL 34X4X40X1 (TOURNIQUET CUFF) ×1 IMPLANT
DECANTER SPIKE VIAL GLASS SM (MISCELLANEOUS) ×3 IMPLANT
DRAPE SHEET LG 3/4 BI-LAMINATE (DRAPES) ×6 IMPLANT
DRAPE U-SHAPE 47X51 STRL (DRAPES) ×3 IMPLANT
DRESSING AQUACEL AG SP 3.5X10 (GAUZE/BANDAGES/DRESSINGS) ×1 IMPLANT
DRSG AQUACEL AG ADV 3.5X10 (GAUZE/BANDAGES/DRESSINGS) ×3 IMPLANT
DRSG AQUACEL AG SP 3.5X10 (GAUZE/BANDAGES/DRESSINGS) ×3
DRSG TEGADERM 4X4.75 (GAUZE/BANDAGES/DRESSINGS) IMPLANT
ELECT BLADE TIP CTD 4 INCH (ELECTRODE) ×3 IMPLANT
ELECT REM PT RETURN 9FT ADLT (ELECTROSURGICAL) ×3
ELECTRODE REM PT RTRN 9FT ADLT (ELECTROSURGICAL) ×1 IMPLANT
EVACUATOR 1/8 PVC DRAIN (DRAIN) IMPLANT
GAUZE SPONGE 4X4 12PLY STRL (GAUZE/BANDAGES/DRESSINGS) ×3 IMPLANT
GLOVE BIO SURGEON STRL SZ8.5 (GLOVE) ×6 IMPLANT
GLOVE BIOGEL PI IND STRL 7.0 (GLOVE) ×2 IMPLANT
GLOVE BIOGEL PI IND STRL 7.5 (GLOVE) ×4 IMPLANT
GLOVE BIOGEL PI IND STRL 8.5 (GLOVE) ×1 IMPLANT
GLOVE BIOGEL PI INDICATOR 7.0 (GLOVE) ×4
GLOVE BIOGEL PI INDICATOR 7.5 (GLOVE) ×8
GLOVE BIOGEL PI INDICATOR 8.5 (GLOVE) ×2
GLOVE SURG SS PI 7.5 STRL IVOR (GLOVE) ×6 IMPLANT
GOWN SPEC L3 XXLG W/TWL (GOWN DISPOSABLE) ×6 IMPLANT
GOWN STRL REUS W/ TWL XL LVL3 (GOWN DISPOSABLE) ×1 IMPLANT
GOWN STRL REUS W/TWL XL LVL3 (GOWN DISPOSABLE) ×6 IMPLANT
HANDPIECE INTERPULSE COAX TIP (DISPOSABLE) ×2
HOOD PEEL AWAY FLYTE STAYCOOL (MISCELLANEOUS) ×6 IMPLANT
LIQUID BAND (GAUZE/BANDAGES/DRESSINGS) ×3 IMPLANT
MARKER SKIN DUAL TIP RULER LAB (MISCELLANEOUS) ×6 IMPLANT
NAVIGATION CASE RENTAL (PORTABLE EQUIPMENT SUPPLIES) ×3 IMPLANT
NEEDLE SPNL 18GX3.5 QUINCKE PK (NEEDLE) ×3 IMPLANT
PACK TOTAL KNEE CUSTOM (KITS) ×3 IMPLANT
PADDING CAST COTTON 6X4 STRL (CAST SUPPLIES) ×3 IMPLANT
POSITIONER SURGICAL ARM (MISCELLANEOUS) ×3 IMPLANT
SAW OSC TIP CART 19.5X105X1.3 (SAW) ×3 IMPLANT
SEALER BIPOLAR AQUA 6.0 (INSTRUMENTS) ×3 IMPLANT
SET HNDPC FAN SPRY TIP SCT (DISPOSABLE) ×1 IMPLANT
SET PAD KNEE POSITIONER (MISCELLANEOUS) ×3 IMPLANT
SOL PREP POV-IOD 4OZ 10% (MISCELLANEOUS) ×3 IMPLANT
SPONGE DRAIN TRACH 4X4 STRL 2S (GAUZE/BANDAGES/DRESSINGS) IMPLANT
SPONGE LAP 18X18 X RAY DECT (DISPOSABLE) IMPLANT
SUCTION FRAZIER HANDLE 12FR (TUBING) ×2
SUCTION TUBE FRAZIER 12FR DISP (TUBING) ×1 IMPLANT
SUT MNCRL AB 3-0 PS2 18 (SUTURE) ×3 IMPLANT
SUT MON AB 2-0 CT1 36 (SUTURE) ×6 IMPLANT
SUT STRATAFIX PDO 1 14 VIOLET (SUTURE) ×2
SUT STRATFX PDO 1 14 VIOLET (SUTURE) ×1
SUT VIC AB 1 CT1 36 (SUTURE) ×9 IMPLANT
SUT VIC AB 2-0 CT1 27 (SUTURE) ×2
SUT VIC AB 2-0 CT1 TAPERPNT 27 (SUTURE) ×1 IMPLANT
SUTURE STRATFX PDO 1 14 VIOLET (SUTURE) ×1 IMPLANT
SYR 50ML LL SCALE MARK (SYRINGE) ×3 IMPLANT
TOWER CARTRIDGE SMART MIX (DISPOSABLE) IMPLANT
TRAY FOLEY W/METER SILVER 14FR (SET/KITS/TRAYS/PACK) ×3 IMPLANT
WRAP KNEE MAXI GEL POST OP (GAUZE/BANDAGES/DRESSINGS) ×3 IMPLANT
YANKAUER SUCT BULB TIP 10FT TU (MISCELLANEOUS) ×3 IMPLANT

## 2016-03-11 NOTE — Anesthesia Preprocedure Evaluation (Addendum)
Anesthesia Evaluation  Patient identified by MRN, date of birth, ID band Patient awake    Reviewed: Allergy & Precautions, NPO status , Patient's Chart, lab work & pertinent test results  History of Anesthesia Complications (+) PONV and history of anesthetic complications  Airway Mallampati: II  TM Distance: >3 FB Neck ROM: Full    Dental no notable dental hx.    Pulmonary neg pulmonary ROS,    Pulmonary exam normal breath sounds clear to auscultation       Cardiovascular hypertension, Pt. on medications Normal cardiovascular exam Rhythm:Regular Rate:Normal     Neuro/Psych negative neurological ROS  negative psych ROS   GI/Hepatic negative GI ROS, Neg liver ROS,   Endo/Other  Hypothyroidism   Renal/GU negative Renal ROS  negative genitourinary   Musculoskeletal  (+) Arthritis ,   Abdominal   Peds negative pediatric ROS (+)  Hematology negative hematology ROS (+)   Anesthesia Other Findings   Reproductive/Obstetrics negative OB ROS                             Anesthesia Physical  Anesthesia Plan  ASA: II  Anesthesia Plan: Spinal   Post-op Pain Management:    Induction: Intravenous  Airway Management Planned: Simple Face Mask  Additional Equipment:   Intra-op Plan:   Post-operative Plan: Extubation in OR  Informed Consent: I have reviewed the patients History and Physical, chart, labs and discussed the procedure including the risks, benefits and alternatives for the proposed anesthesia with the patient or authorized representative who has indicated his/her understanding and acceptance.   Dental advisory given  Plan Discussed with: CRNA and Surgeon  Anesthesia Plan Comments:         Anesthesia Quick Evaluation

## 2016-03-11 NOTE — Anesthesia Procedure Notes (Signed)
Spinal  Patient location during procedure: OR Reason for block: procedure for pain Staffing Resident/CRNA: Tennelle Taflinger G Performed: resident/CRNA  Preanesthetic Checklist Completed: patient identified, site marked, surgical consent, pre-op evaluation, timeout performed, IV checked, risks and benefits discussed and monitors and equipment checked Spinal Block Patient position: sitting Prep: ChloraPrep Patient monitoring: heart rate, cardiac monitor, continuous pulse ox and blood pressure Approach: midline Location: L3-4 Injection technique: single-shot Needle Needle type: Pencan  Needle gauge: 24 G

## 2016-03-11 NOTE — Op Note (Signed)
OPERATIVE REPORT  SURGEON: Rod Can, MD   ASSISTANT: Staff.  PREOPERATIVE DIAGNOSIS: Right knee arthritis.   POSTOPERATIVE DIAGNOSIS: Right knee arthritis.   PROCEDURE: Right total knee arthroplasty.   IMPLANTS: Stryker Triathlon CR femur, size 3. Stryker Tritanium tibia, size 4. X3 polyethelyene insert, size 9 mm, CS. 3 button asymmetric patella, size 32 mm.  ANESTHESIA:  Spinal  TOURNIQUET TIME: Not utilized.   ESTIMATED BLOOD LOSS: 300 mL.    ANTIBIOTICS: 2 g Ancef.  DRAINS: None.  COMPLICATIONS: None   CONDITION: PACU - hemodynamically stable.   BRIEF CLINICAL NOTE: [name] is a 75 y.o. female with a long-standing history of Right knee arthritis. After failing conservative management, the patient was indicated for total knee arthroplasty. The risks, benefits, and alternatives to the procedure were explained, and the patient elected to proceed.  PROCEDURE IN DETAIL: Spinal anesthesia was obtained in the pre-op holding area. Once inside the operative room, a foley catheter was inserted. The patient was then positioned, a nonsterile tourniquet was placed, and the lower extremity was prepped and draped in the normal sterile surgical fashion. A time-out was called verifying side and site of surgery. The patient received IV antibiotics within 60 minutes of beginning the procedure. The tourniquet was not utilized.  An anterior approach to the knee was performed utilizing a midvastus arthrotomy. A medial release was performed and the patellar fat pad was excised. Stryker navigation was used to cut the distal femur perpendicular to the mechanical axis. A freehand patellar resection was performed, and the patella was sized an prepared with 3 lug holes.  Nagivation was used to make a neutral proximal tibia resection, taking 2 mm of bone from the less affected lateral  side with 3 degrees of slope. The menisci were excised. A spacer block was placed, and the alignment and balance in extension were confirmed.   The distal femur was sized using the 3-degree external rotation guide referencing the posterior femoral cortex. The appropriate 4-in-1 cutting block was pinned into place. Rotation was checked using Whiteside's line, the epicondylar axis, and then confirmed with a spacer block in flexion. The remaining femoral cuts were performed, taking care to protect the MCL.  The tibia was sized and the trial tray was pinned into place. The remaining trail components were inserted. The knee was stable to varus and valgus stress through a full range of motion. The patella tracked centrally, and the PCL was well balanced. The trial components were removed, and the proximal tibial surface was prepared. Final components were impacted into place. The knee was tested for a final time and found to be well balanced.  The wound was copiously irrigated with a dilute betadine solution followed by normal saline with pulse lavage. Marcaine solution was injected into the periarticular soft tissue. The wound was closed in layers using #1 Vicryl and Stratafix for the fascia, 2-0 Vicryl for the subcutaneous fat, 2-0 Monocryl for the deep dermal layer, 3-0 running Monocryl subcuticular Stitch, and Dermabond for the skin. Once the glue was fully dried, an Aquacell Ag and compressive dressing were applied. Tthe patient was transported to the recovery room in stable condition. Sponge, needle, and instrument counts were correct at the end of the case x2. The patient tolerated the procedure well and there were no known complications.

## 2016-03-11 NOTE — Discharge Instructions (Signed)
° °Dr. Thomasene Dubow °Total Joint Specialist °[facility] °[address] °[place], [place] [address] °[phone] ° °TOTAL KNEE REPLACEMENT POSTOPERATIVE DIRECTIONS ° ° ° °Knee Rehabilitation, Guidelines Following Surgery  °Results after knee surgery are often greatly improved when you follow the exercise, range of motion and muscle strengthening exercises prescribed by your doctor. Safety measures are also important to protect the knee from further injury. Any time any of these exercises cause you to have increased pain or swelling in your knee joint, decrease the amount until you are comfortable again and slowly increase them. If you have problems or questions, call your caregiver or physical therapist for advice.  ° °WEIGHT BEARING °Weight bearing as tolerated with assist device (walker, cane, etc) as directed, use it as long as suggested by your surgeon or therapist, typically at least 4-6 weeks. ° °HOME CARE INSTRUCTIONS  °Remove items at home which could result in a fall. This includes throw rugs or furniture in walking pathways.  °Continue medications as instructed at time of discharge. °You may have some home medications which will be placed on hold until you complete the course of blood thinner medication.  °You may start showering once you are discharged home but do not submerge the incision under water. Just pat the incision dry and apply a dry gauze dressing on daily. °Walk with walker as instructed.  °You may resume a sexual relationship in one month or when given the OK by your doctor.  °· Use walker as long as suggested by your caregivers. °· Avoid periods of inactivity such as sitting longer than an hour when not asleep. This helps prevent blood clots.  °You may put full weight on your legs and walk as much as is comfortable.  °You may return to work once you are cleared by your doctor.  °Do not drive a car for 6 weeks or until released by you surgeon.  °· Do not drive  while taking narcotics.  °Wear the elastic stockings for three weeks following surgery during the day but you may remove then at night. °Make sure you keep all of your appointments after your operation with all of your doctors and caregivers. You should call the office at the above phone number and make an appointment for approximately two weeks after the date of your surgery. °Do not remove your surgical dressing. The dressing is waterproof; you may take showers in 3 days, but do not take tub baths or submerge the dressing. °Please pick up a stool softener and laxative for home use as long as you are requiring pain medications. °· ICE to the affected knee every three hours for 30 minutes at a time and then as needed for pain and swelling.  Continue to use ice on the knee for pain and swelling from surgery. You may notice swelling that will progress down to the foot and ankle.  This is normal after surgery.  Elevate the leg when you are not up walking on it.   °It is important for you to complete the blood thinner medication as prescribed by your doctor. °· Continue to use the breathing machine which will help keep your temperature down.  It is common for your temperature to cycle up and down following surgery, especially at night when you are not up moving around and exerting yourself.  The breathing machine keeps your lungs expanded and your temperature down. ° °RANGE OF MOTION AND STRENGTHENING EXERCISES  °Rehabilitation of the knee is important following   a knee injury or an operation. After just a few days of immobilization, the muscles of the thigh which control the knee become weakened and shrink (atrophy). Knee exercises are designed to build up the tone and strength of the thigh muscles and to improve knee motion. Often times heat used for twenty to thirty minutes before working out will loosen up your tissues and help with improving the range of motion but do not use heat for the first two weeks following  surgery. These exercises can be done on a training (exercise) mat, on the floor, on a table or on a bed. Use what ever works the best and is most comfortable for you Knee exercises include:  °Leg Lifts - While your knee is still immobilized in a splint or cast, you can do straight leg raises. Lift the leg to 60 degrees, hold for 3 sec, and slowly lower the leg. Repeat 10-20 times 2-3 times daily. Perform this exercise against resistance later as your knee gets better.  °Quad and Hamstring Sets - Tighten up the muscle on the front of the thigh (Quad) and hold for 5-10 sec. Repeat this 10-20 times hourly. Hamstring sets are done by pushing the foot backward against an object and holding for 5-10 sec. Repeat as with quad sets.  °A rehabilitation program following serious knee injuries can speed recovery and prevent re-injury in the future due to weakened muscles. Contact your doctor or a physical therapist for more information on knee rehabilitation.  ° °SKILLED REHAB INSTRUCTIONS: °If the patient is transferred to a skilled rehab facility following release from the hospital, a list of the current medications will be sent to the facility for the patient to continue.  When discharged from the skilled rehab facility, please have the facility set up the patient's Home Health Physical Therapy prior to being released. Also, the skilled facility will be responsible for providing the patient with their medications at time of release from the facility to include their pain medication, the muscle relaxants, and their blood thinner medication. If the patient is still at the rehab facility at time of the two week follow up appointment, the skilled rehab facility will also need to assist the patient in arranging follow up appointment in our office and any transportation needs. ° °MAKE SURE YOU:  °Understand these instructions.  °Will watch your condition.  °Will get help right away if you are not doing well or get worse.   ° ° °Pick up stool softner and laxative for home use following surgery while on pain medications. °Do NOT remove your dressing. You may shower.  °Do not take tub baths or submerge incision under water. °May shower starting three days after surgery. °Please use a clean towel to pat the incision dry following showers. °Continue to use ice for pain and swelling after surgery. °Do not use any lotions or creams on the incision until instructed by your surgeon. ° °

## 2016-03-11 NOTE — Anesthesia Postprocedure Evaluation (Signed)
Anesthesia Post Note  Patient: [name]  Procedure(s) Performed: Procedure(s) (LRB): RIGHT TOTAL KNEE ARTHROPLASTY WITH NAVIGATION (Right)  Patient location during evaluation: PACU Anesthesia Type: Spinal Level of consciousness: oriented and awake and alert Pain management: pain level controlled Vital Signs Assessment: post-procedure vital signs reviewed and stable Respiratory status: spontaneous breathing, respiratory function stable and patient connected to nasal cannula oxygen Cardiovascular status: blood pressure returned to baseline and stable Postop Assessment: no headache and no backache Anesthetic complications: no    Last Vitals:  Vitals:   03/11/16 1600 03/11/16 1615  BP: 107/63 (!) 117/59  Pulse: 89 88  Resp: 14 15  Temp:      Last Pain:  Vitals:   03/11/16 1545  TempSrc:   PainSc: 0-No pain                 Zenaida Deed

## 2016-03-11 NOTE — Transfer of Care (Signed)
Immediate Anesthesia Transfer of Care Note  Patient: [name]  Procedure(s) Performed: Procedure(s): RIGHT TOTAL KNEE ARTHROPLASTY WITH NAVIGATION (Right)  Patient Location: PACU  Anesthesia Type:Spinal  Level of Consciousness: awake, alert  and oriented  Airway & Oxygen Therapy: Patient Spontanous Breathing and Patient connected to face mask oxygen  Post-op Assessment: Report given to RN and Post -op Vital signs reviewed and stable  Post vital signs: Reviewed and stable  Last Vitals:  Vitals:   03/11/16 1020  BP: (!) 123/98  Pulse: 74  Resp: 16  Temp: 36.7 C    Last Pain:  Vitals:   03/11/16 1049  TempSrc:   PainSc: 2       Patients Stated Pain Goal: 4 ([id number])  Complications: No apparent anesthesia complications

## 2016-03-11 NOTE — H&P (View-Only) (Signed)
TOTAL KNEE ADMISSION H&P  Patient is being admitted for right total knee arthroplasty.  Subjective:  Chief Complaint:right knee pain.  HPI: [name], 75 y.o. female, has a history of pain and functional disability in the right knee due to arthritis and has failed non-surgical conservative treatments for greater than 12 weeks to includeNSAID's and/or analgesics, corticosteriod injections, viscosupplementation injections, flexibility and strengthening excercises, supervised PT with diminished ADL's post treatment, use of assistive devices and activity modification.  Onset of symptoms was gradual, starting >10 years ago with rapidlly worsening course since that time. The patient noted no past surgery on the right knee(s).  Patient currently rates pain in the right knee(s) at 10 out of 10 with activity. Patient has night pain, worsening of pain with activity and weight bearing, pain that interferes with activities of daily living, pain with passive range of motion, crepitus and joint swelling.  Patient has evidence of subchondral cysts, subchondral sclerosis, periarticular osteophytes and joint space narrowing by imaging studies. There is no active infection.  There are no active problems to display for this patient.  Past Medical History:  Diagnosis Date  . Arthritis   . Hyperlipemia   . Hypertension   . Hypothyroidism   . Wears glasses     Past Surgical History:  Procedure Laterality Date  . ABDOMINAL HYSTERECTOMY  1978  . ACHILLES TENDON SURGERY Right 06/27/2014   Procedure: RIGHT ACHILLES DEBRIDEMENT AND REPAIR WITH TRANSFER OF FLEXOR HALLUCIS LONGUS TENDON TO THE CALCANEUS  ;  Surgeon: [name], MD;  Location: [place];  Service: Orthopedics;  Laterality: Right;  . ACHILLES TENDON SURGERY Left 11/28/2014   Procedure: LEFT ACHILLES DEBRIDEMENT AND REPAIR/GASTROC RECESSION;  Surgeon: [name], MD;  Location: [place];  Service: Orthopedics;   Laterality: Left;  . COLONOSCOPY    . EYE SURGERY  2014,2015   cataracts-both  . GASTROC RECESSION EXTREMITY Left 11/28/2014   Procedure: GASTROC RECESSION EXTREMITY;  Surgeon: [name], MD;  Location: [place];  Service: Orthopedics;  Laterality: Left;  . TONSILLECTOMY       (Not in a hospital admission) No Known Allergies  Social History  Substance Use Topics  . Smoking status: Never Smoker  . Smokeless tobacco: Not on file  . Alcohol use No    No family history on file.   Review of Systems  Constitutional: Negative.   HENT: Negative.   Eyes: Negative.   Cardiovascular: Negative.   Gastrointestinal: Negative.   Genitourinary: Negative.   Musculoskeletal: Positive for joint pain.  Skin: Negative.   Neurological: Negative.   Endo/Heme/Allergies: Negative.   Psychiatric/Behavioral: Negative.     Objective:  Physical Exam  Constitutional: She is oriented to person, place, and time. She appears well-developed and well-nourished.  HENT:  Head: Normocephalic and atraumatic.  Eyes: Conjunctivae and EOM are normal. Pupils are equal, round, and reactive to light.  Neck: Normal range of motion. Neck supple.  Cardiovascular: Normal rate, regular rhythm and intact distal pulses.   Respiratory: Effort normal and breath sounds normal. No respiratory distress.  GI: Soft. Bowel sounds are normal. She exhibits distension.  Genitourinary:  Genitourinary Comments: deferred  Musculoskeletal:       Right knee: She exhibits decreased range of motion, swelling and effusion. Tenderness found. Medial joint line and lateral joint line tenderness noted.  Neurological: She is alert and oriented to person, place, and time. She has normal reflexes.  Skin: Skin is warm and dry.  Psychiatric:  She has a normal mood and affect. Her behavior is normal. Judgment and thought content normal.    Vital signs in last 24 hours: @VSRANGES @  Labs:   Estimated body mass index is  26.31 kg/m as calculated from the following:   Height as of 11/28/14: 5\' 7"  (1.702 m).   Weight as of 11/28/14: 76.2 kg (168 lb).   Imaging Review Plain radiographs demonstrate severe degenerative joint disease of the right knee(s). The overall alignment ismild varus. The bone quality appears to be adequate for age and reported activity level.  Assessment/Plan:  End stage arthritis, right knee   The patient history, physical examination, clinical judgment of the provider and imaging studies are consistent with end stage degenerative joint disease of the right knee(s) and total knee arthroplasty is deemed medically necessary. The treatment options including medical management, injection therapy arthroscopy and arthroplasty were discussed at length. The risks and benefits of total knee arthroplasty were presented and reviewed. The risks due to aseptic loosening, infection, stiffness, patella tracking problems, thromboembolic complications and other imponderables were discussed. The patient acknowledged the explanation, agreed to proceed with the plan and consent was signed. Patient is being admitted for inpatient treatment for surgery, pain control, PT, OT, prophylactic antibiotics, VTE prophylaxis, progressive ambulation and ADL's and discharge planning. The patient is planning to be discharged home with home health services

## 2016-03-11 NOTE — Interval H&P Note (Signed)
History and Physical Interval Note:  03/11/2016 12:42 PM  [name]  has presented today for surgery, with the diagnosis of DEGENERATIVE JOINT RIGHT KNEE  The various methods of treatment have been discussed with the patient and family. After consideration of risks, benefits and other options for treatment, the patient has consented to  Procedure(s): RIGHT TOTAL KNEE ARTHROPLASTY WITH NAVIGATION (Right) as a surgical intervention .  The patient's history has been reviewed, patient examined, no change in status, stable for surgery.  I have reviewed the patient's chart and labs.  Questions were answered to the patient's satisfaction.     Haja Crego, Horald Pollen

## 2016-03-12 ENCOUNTER — Encounter (HOSPITAL_COMMUNITY): Payer: Self-pay | Admitting: Orthopedic Surgery

## 2016-03-12 LAB — CBC
HCT: 27.3 % — ABNORMAL LOW (ref 36.0–46.0)
HEMOGLOBIN: 9.2 g/dL — AB (ref 12.0–15.0)
MCH: 30.7 pg (ref 26.0–34.0)
MCHC: 33.7 g/dL (ref 30.0–36.0)
MCV: 91 fL (ref 78.0–100.0)
Platelets: 214 10*3/uL (ref 150–400)
RBC: 3 MIL/uL — ABNORMAL LOW (ref 3.87–5.11)
RDW: 12.5 % (ref 11.5–15.5)
WBC: 10.7 10*3/uL — ABNORMAL HIGH (ref 4.0–10.5)

## 2016-03-12 LAB — BASIC METABOLIC PANEL
Anion gap: 6 (ref 5–15)
BUN: 26 mg/dL — AB (ref 6–20)
CHLORIDE: 105 mmol/L (ref 101–111)
CO2: 25 mmol/L (ref 22–32)
CREATININE: 0.71 mg/dL (ref 0.44–1.00)
Calcium: 8.9 mg/dL (ref 8.9–10.3)
GFR calc Af Amer: >60 mL/min (ref >60)
GFR calc non Af Amer: >60 mL/min (ref >60)
GLUCOSE: 153 mg/dL — AB (ref 65–99)
POTASSIUM: 4.1 mmol/L (ref 3.5–5.1)
SODIUM: 136 mmol/L (ref 135–145)

## 2016-03-12 MED ORDER — SENNA 8.6 MG PO TABS: 2.0000 | ORAL_TABLET | Freq: Every day | ORAL | 3 refills | Status: DC

## 2016-03-12 MED ORDER — DOCUSATE SODIUM 100 MG PO CAPS: 100.0000 mg | ORAL_CAPSULE | Freq: Two times a day (BID) | ORAL | 3 refills | Status: DC

## 2016-03-12 MED ORDER — ASPIRIN 81 MG PO CHEW: 81.0000 mg | CHEWABLE_TABLET | Freq: Two times a day (BID) | ORAL | 1 refills | Status: DC

## 2016-03-12 MED ORDER — METHOCARBAMOL 500 MG PO TABS: 500.0000 mg | ORAL_TABLET | Freq: Four times a day (QID) | ORAL | 0 refills | Status: DC | PRN

## 2016-03-12 MED ORDER — HYDROCODONE-ACETAMINOPHEN 5-325 MG PO TABS: 1.0000 | ORAL_TABLET | ORAL | 0 refills | Status: DC | PRN

## 2016-03-12 MED ORDER — ONDANSETRON HCL 4 MG PO TABS: 4.0000 mg | ORAL_TABLET | Freq: Four times a day (QID) | ORAL | 0 refills | Status: DC | PRN

## 2016-03-12 NOTE — Progress Notes (Signed)
   Subjective:  Patient reports pain as mild to moderate.  No c/o. Denies N/V/CP/SOB.  Objective:   VITALS:   Vitals:   03/11/16 1930 03/11/16 2030 03/12/16 0240 03/12/16 0558  BP: 138/61 123/63 (!) 132/59 (!) 125/52  Pulse: 97 78 95 80  Resp: 16 16 16 16   Temp: 97.5 F (36.4 C) 98.2 F (36.8 C) 98.4 F (36.9 C) 98.5 F (36.9 C)  TempSrc: Oral Oral Oral Oral  SpO2: 100% 98% 98% 98%  Weight:      Height:        ABD soft Sensation intact distally Intact pulses distally Dorsiflexion/Plantar flexion intact Incision: dressing C/D/I Compartment soft   Lab Results  Component Value Date   WBC 10.7 (H) 03/12/2016   HGB 9.2 (L) 03/12/2016   HCT 27.3 (L) 03/12/2016   MCV 91.0 03/12/2016   PLT 214 03/12/2016   BMET    Component Value Date/Time   NA 136 03/12/2016 0402   K 4.1 03/12/2016 0402   CL 105 03/12/2016 0402   CO2 25 03/12/2016 0402   GLUCOSE 153 (H) 03/12/2016 0402   BUN 26 (H) 03/12/2016 0402   CREATININE 0.71 03/12/2016 0402   CALCIUM 8.9 03/12/2016 0402   GFRNONAA >60 03/12/2016 0402   GFRAA >60 03/12/2016 0402     Assessment/Plan: 1 Day Post-Op   Principal Problem:   Primary osteoarthritis of right knee   WBAT  DVT ppx: ASA, SCDs, TEDs PO pain control PT/OT Dispo: D/c home after clears therapy    Aven Cegielski, Horald Pollen 03/12/2016, 7:20 AM   Rod Can, MD Cell [phone]

## 2016-03-12 NOTE — Evaluation (Signed)
Physical Therapy Evaluation Patient Details Name: [name] MRN: [id number] DOB: 1941-03-01 Today's Date: 03/12/2016   History of Present Illness  Pt s/p R TKR with hx of bilat achillies surgery  Clinical Impression  Pt s/p R TKR presents with decreased R LE strength/ROM and post op pain limiting functional mobility.  Pt should progress to dc home with family assist and HHPT follow up.    Follow Up Recommendations Home health PT    Equipment Recommendations  None recommended by PT    Recommendations for Other Services OT consult     Precautions / Restrictions Precautions Precautions: Knee;Fall Restrictions Weight Bearing Restrictions: No Other Position/Activity Restrictions: WBAT      Mobility  Bed Mobility Overal bed mobility: Needs Assistance Bed Mobility: Supine to Sit     Supine to sit: Min guard     General bed mobility comments: min cues for sequence and use of L LE to self assist  Transfers Overall transfer level: Needs assistance Equipment used: Rolling walker (2 wheeled) Transfers: Sit to/from Stand Sit to Stand: Min guard         General transfer comment: cues for LE management and use of UEs to self assist  Ambulation/Gait Ambulation/Gait assistance: Min guard Ambulation Distance (Feet): 90 Feet Assistive device: Rolling walker (2 wheeled) Gait Pattern/deviations: Step-to pattern;Decreased step length - right;Decreased step length - left;Shuffle;Trunk flexed Gait velocity: decr Gait velocity interpretation: Below normal speed for age/gender General Gait Details: cues for sequence, posture and position from RW.  Pt lifting RW to simulate use of SW at home  Stairs            Wheelchair Mobility    Modified Rankin (Stroke Patients Only)       Balance                                             Pertinent Vitals/Pain Pain Assessment: 0-10 Pain Score: 3  Pain Location: R knee Pain Descriptors / Indicators:  Aching;Sore Pain Intervention(s): Limited activity within patient's tolerance;Monitored during session;Premedicated before session;Ice applied    Home Living Family/patient expects to be discharged to:: Private residence Living Arrangements: Spouse/significant other Available Help at Discharge: Family Type of Home: House Home Access: Ramped entrance     [place]: Able to live on main level with bedroom/bathroom Home Equipment: Walker - standard      Prior Function Level of Independence: Independent               Hand Dominance        Extremity/Trunk Assessment   Upper Extremity Assessment: Overall WFL for tasks assessed           Lower Extremity Assessment: RLE deficits/detail RLE Deficits / Details: 3/5 quads with IND SLR; AAROM at knee -10- 90    Cervical / Trunk Assessment: Normal  Communication   Communication: No difficulties  Cognition Arousal/Alertness: Awake/alert Behavior During Therapy: WFL for tasks assessed/performed Overall Cognitive Status: Within Functional Limits for tasks assessed                      General Comments      Exercises Total Joint Exercises Ankle Circles/Pumps: AROM;Both;15 reps;Supine Quad Sets: AROM;Both;10 reps;Supine Heel Slides: AAROM;Right;15 reps;Supine Straight Leg Raises: AAROM;AROM;Right;15 reps;Supine      Assessment/Plan    PT Assessment Patient needs continued PT services  PT Diagnosis Difficulty walking   PT Problem List Decreased strength;Decreased range of motion;Decreased activity tolerance;Decreased mobility;Pain;Decreased knowledge of use of DME  PT Treatment Interventions DME instruction;Gait training;Functional mobility training;Therapeutic activities;Therapeutic exercise;Patient/family education   PT Goals (Current goals can be found in the Care Plan section) Acute Rehab PT Goals Patient Stated Goal: Regain IND PT Goal Formulation: With patient Time For Goal Achievement:  03/14/16 Potential to Achieve Goals: Good    Frequency 7X/week   Barriers to discharge        Co-evaluation               End of Session Equipment Utilized During Treatment: Gait belt Activity Tolerance: Patient tolerated treatment well Patient left: in chair;with call bell/phone within reach Nurse Communication: Mobility status         Time: [id number] PT Time Calculation (min) (ACUTE ONLY): 38 min   Charges:   PT Evaluation $PT Eval Low Complexity: 1 Procedure PT Treatments $Gait Training: 8-22 mins $Therapeutic Exercise: 8-22 mins   PT G Codes:        [name] Mar 26, 2016, 12:35 PM

## 2016-03-12 NOTE — Progress Notes (Signed)
Occupational Therapy Evaluation Patient Details Name: [name] MRN: [id number] DOB: [birth year] Today's Date: 03/12/2016    History of Present Illness Pt s/p R TKR with hx of bilat achillies surgery   Clinical Impression   All OT education completed and pt questions answered. No further OT needs at this time.    Follow Up Recommendations  No OT follow up;Supervision/Assistance - 24 hour    Equipment Recommendations  None recommended by OT    Recommendations for Other Services       Precautions / Restrictions Precautions Precautions: Knee;Fall Restrictions Weight Bearing Restrictions: No Other Position/Activity Restrictions: WBAT      Mobility Bed Mobility                  Transfers                      Balance                                            ADL Overall ADL's : Needs assistance/impaired             Lower Body Bathing: Supervison/ safety;Sit to/from stand   Upper Body Dressing : Set up;Sitting   Lower Body Dressing: Supervision/safety;Sit to/from Health and safety inspector Details (indicate cue type and reason): has been toileting with nursing staff supervision without difficulty; declines to practice on eval       Tub/Shower Transfer Details (indicate cue type and reason): has walk-in shower with grab bars/seat; reviewed technique but patient declined to practice on eval Functional mobility during ADLs: Supervision/safety;Rolling walker       Vision     Perception     Praxis      Pertinent Vitals/Pain Pain Assessment: 0-10 Pain Score: 2  Pain Location: R knee Pain Descriptors / Indicators: Aching;Sore Pain Intervention(s): Monitored during session     Hand Dominance     Extremity/Trunk Assessment Upper Extremity Assessment Upper Extremity Assessment: Overall WFL for tasks assessed   Lower Extremity Assessment Lower Extremity Assessment: Defer to PT evaluation   Cervical / Trunk  Assessment Cervical / Trunk Assessment: Normal   Communication Communication Communication: No difficulties   Cognition Arousal/Alertness: Awake/alert Behavior During Therapy: WFL for tasks assessed/performed Overall Cognitive Status: Within Functional Limits for tasks assessed                     General Comments       Exercises       Shoulder Instructions      Home Living Family/patient expects to be discharged to:: Private residence Living Arrangements: Spouse/significant other Available Help at Discharge: Family Type of Home: House Home Access: Ramped entrance     Home Layout: Able to live on main level with bedroom/bathroom     Bathroom Shower/Tub: Hospital doctor Toilet: Handicapped height     Home Equipment: Environmental consultant - standard;Bedside commode;Tub bench;Grab bars - toilet;Grab bars - tub/shower          Prior Functioning/Environment Level of Independence: Independent             OT Diagnosis: Acute pain   OT Problem List: Decreased strength;Decreased range of motion;Pain   OT Treatment/Interventions:      OT Goals(Current goals can be found in the care plan section) Acute Rehab OT Goals Patient Stated  Goal: Regain IND  OT Frequency:     Barriers to D/C:            Co-evaluation              End of Session Equipment Utilized During Treatment: Rolling walker  Activity Tolerance: Patient tolerated treatment well Patient left: in bed;with call bell/phone within reach   Time: [id number] OT Time Calculation (min): 10 min Charges:  OT General Charges $OT Visit: 1 Procedure OT Evaluation $OT Eval Low Complexity: 1 Procedure G-Codes:    Tehran Rabenold A 2016/03/25, 1:55 PM

## 2016-03-12 NOTE — Discharge Summary (Signed)
Physician Discharge Summary  Patient ID: [name] MRN: [id number] DOB/AGE: [birth year] 75 y.o.  Admit date: 03/11/2016 Discharge date: 03/12/2016  Admission Diagnoses:  Primary osteoarthritis of right knee  Discharge Diagnoses:  Principal Problem:   Primary osteoarthritis of right knee   Past Medical History:  Diagnosis Date  . Arthritis   . Hyperlipemia   . Hypertension   . Hypothyroidism   . PONV (postoperative nausea and vomiting)    "years ago"  . Wears glasses     Surgeries: Procedure(s): RIGHT TOTAL KNEE ARTHROPLASTY WITH NAVIGATION on 03/11/2016   Consultants (if any):   Discharged Condition: Improved  Hospital Course: [name] is an 75 y.o. female who was admitted 03/11/2016 with a diagnosis of Primary osteoarthritis of right knee and went to the operating room on 03/11/2016 and underwent the above named procedures.    She was given perioperative antibiotics:  Anti-infectives    Start     Dose/Rate Route Frequency Ordered Stop   03/11/16 2000  ceFAZolin (ANCEF) IVPB 1 g/50 mL premix     1 g 100 mL/hr over 30 Minutes Intravenous Every 6 hours 03/11/16 1740 03/12/16 0230   03/11/16 1026  ceFAZolin (ANCEF) IVPB 2g/100 mL premix     2 g 200 mL/hr over 30 Minutes Intravenous On call to O.R. 03/11/16 1026 03/11/16 1304    .  She was given sequential compression devices, early ambulation, and ASA for DVT prophylaxis.  She benefited maximally from the hospital stay and there were no complications.    Recent vital signs:  Vitals:   03/12/16 0240 03/12/16 0558  BP: (!) 132/59 (!) 125/52  Pulse: 95 80  Resp: 16 16  Temp: 98.4 F (36.9 C) 98.5 F (36.9 C)    Recent laboratory studies:  Lab Results  Component Value Date   HGB 9.2 (L) 03/12/2016   HGB 12.3 03/09/2016   HGB 16.7 (H) 06/27/2014   Lab Results  Component Value Date   WBC 10.7 (H) 03/12/2016   PLT 214 03/12/2016   No results found for: INR Lab Results  Component Value Date    NA 136 03/12/2016   K 4.1 03/12/2016   CL 105 03/12/2016   CO2 25 03/12/2016   BUN 26 (H) 03/12/2016   CREATININE 0.71 03/12/2016   GLUCOSE 153 (H) 03/12/2016    Discharge Medications:     Medication List    STOP taking these medications   acetaminophen 325 MG tablet Commonly known as:  TYLENOL   aspirin 81 MG tablet Replaced by:  aspirin 81 MG chewable tablet     TAKE these medications   ascorbic acid 500 MG tablet Commonly known as:  VITAMIN C Take 500 mg by mouth daily.   aspirin 81 MG chewable tablet Chew 1 tablet (81 mg total) by mouth 2 (two) times daily. Replaces:  aspirin 81 MG tablet   calcium carbonate 600 MG Tabs tablet Commonly known as:  OS-CAL Take 600 mg by mouth 2 (two) times daily with a meal.   docusate sodium 100 MG capsule Commonly known as:  COLACE Take 1 capsule (100 mg total) by mouth 2 (two) times daily.   HYDROcodone-acetaminophen 5-325 MG tablet Commonly known as:  NORCO/VICODIN Take 1-2 tablets by mouth every 4 (four) hours as needed (breakthrough pain).   ibuprofen 200 MG tablet Commonly known as:  ADVIL,MOTRIN Take 200 mg by mouth every 6 (six) hours as needed (taks 3 caps).   levothyroxine 100 MCG tablet Commonly known  as:  SYNTHROID, LEVOTHROID Take 100 mcg by mouth daily before breakfast.   lisinopril 10 MG tablet Commonly known as:  PRINIVIL,ZESTRIL Take 10 mg by mouth daily.   methocarbamol 500 MG tablet Commonly known as:  ROBAXIN Take 1 tablet (500 mg total) by mouth every 6 (six) hours as needed for muscle spasms.   ondansetron 4 MG tablet Commonly known as:  ZOFRAN Take 1 tablet (4 mg total) by mouth every 6 (six) hours as needed for nausea.   Red Yeast Rice 600 MG Tabs Take 600-1,200 mg by mouth See admin instructions. 600 mg (1 tablet) every morning and 1200 mg (2 tablets) every evening.   senna 8.6 MG Tabs tablet Commonly known as:  SENOKOT Take 2 tablets (17.2 mg total) by mouth at bedtime.        Diagnostic Studies: Dg Knee Right Port  Result Date: 03/11/2016 CLINICAL DATA:  Right total knee replacement EXAM: PORTABLE RIGHT KNEE - 1-2 VIEW COMPARISON:  MR right knee of 07/11/2003 FINDINGS: The right femoral and tibial components of the right total knee replacement are in good position and alignment. No complicating features are seen. Only a small amount air is noted in the joint space postoperatively. IMPRESSION: Right total knee replacement with components in good position and alignment. Electronically Signed   By: Ivar Drape M.D.   On: 03/11/2016 16:58    Disposition: 01-Home or Self Care  Discharge Instructions    Call MD / Call 911    Complete by:  As directed   If you experience chest pain or shortness of breath, CALL 911 and be transported to the hospital emergency room.  If you develope a fever above 101 F, pus (white drainage) or increased drainage or redness at the wound, or calf pain, call your surgeon's office.   Constipation Prevention    Complete by:  As directed   Drink plenty of fluids.  Prune juice may be helpful.  You may use a stool softener, such as Colace (over the counter) 100 mg twice a day.  Use MiraLax (over the counter) for constipation as needed.   Diet - low sodium heart healthy    Complete by:  As directed   Do not put a pillow under the knee. Place it under the heel.    Complete by:  As directed   Driving restrictions    Complete by:  As directed   No driving for 6 weeks   Increase activity slowly as tolerated    Complete by:  As directed   Lifting restrictions    Complete by:  As directed   No lifting for 6 weeks   TED hose    Complete by:  As directed   Use stockings (TED hose) for 2 weeks on both leg(s).  You may remove them at night for sleeping.      Follow-up Information    Aseem Sessums, Horald Pollen, MD. Schedule an appointment as soon as possible for a visit in 2 weeks.   Specialty:  Orthopedic Surgery Why:  For wound re-check Contact  information: [place] [place] [address] [phone]            Signed: Elie Goody 03/12/2016, 7:24 AM

## 2016-03-12 NOTE — Progress Notes (Signed)
Physical Therapy Treatment Patient Details Name: [name] MRN: [id number] DOB: [birth year] Today's Date: 03/12/2016    History of Present Illness Pt s/p R TKR with hx of bilat achillies surgery    PT Comments    Pt progressing well with mobility and eager for return home.  Follow Up Recommendations  Home health PT     Equipment Recommendations  None recommended by PT    Recommendations for Other Services OT consult     Precautions / Restrictions Precautions Precautions: Knee;Fall Restrictions Weight Bearing Restrictions: No Other Position/Activity Restrictions: WBAT    Mobility  Bed Mobility Overal bed mobility: Needs Assistance Bed Mobility: Supine to Sit;Sit to Supine     Supine to sit: Supervision Sit to supine: Supervision   General bed mobility comments: min cues for sequence and use of L LE to self assist  Transfers Overall transfer level: Needs assistance Equipment used: Rolling walker (2 wheeled) Transfers: Sit to/from Stand Sit to Stand: Supervision            Ambulation/Gait Ambulation/Gait assistance: Min guard;Supervision Ambulation Distance (Feet): 150 Feet Assistive device: Rolling walker (2 wheeled) Gait Pattern/deviations: Step-to pattern;Decreased step length - right;Shuffle;Trunk flexed Gait velocity: decr Gait velocity interpretation: Below normal speed for age/gender General Gait Details: cues for sequence, posture and position from RW.  Pt lifting RW to simulate use of SW at home   Stairs            Wheelchair Mobility    Modified Rankin (Stroke Patients Only)       Balance                                    Cognition Arousal/Alertness: Awake/alert Behavior During Therapy: WFL for tasks assessed/performed Overall Cognitive Status: Within Functional Limits for tasks assessed                      Exercises Total Joint Exercises Ankle Circles/Pumps: AROM;Both;15 reps;Supine Quad  Sets: AROM;Both;10 reps;Supine Heel Slides: AAROM;Right;15 reps;Supine Straight Leg Raises: AAROM;AROM;Right;15 reps;Supine    General Comments        Pertinent Vitals/Pain Pain Assessment: 0-10 Pain Score: 4  Pain Location: R knee Pain Descriptors / Indicators: Aching;Sore Pain Intervention(s): Limited activity within patient's tolerance;Monitored during session;Patient requesting pain meds-RN notified;Ice applied (Pt declined pain meds prior to session)    Home Living Family/patient expects to be discharged to:: Private residence Living Arrangements: Spouse/significant other Available Help at Discharge: Family Type of Home: House Home Access: [place]: Able to live on main level with bedroom/bathroom Home Equipment: Walker - standard;Bedside commode;Tub bench;Grab bars - toilet;Grab bars - tub/shower      Prior Function Level of Independence: Independent          PT Goals (current goals can now be found in the care plan section) Acute Rehab PT Goals Patient Stated Goal: Regain IND PT Goal Formulation: With patient Time For Goal Achievement: 03/14/16 Potential to Achieve Goals: Good Progress towards PT goals: Progressing toward goals    Frequency  7X/week    PT Plan Current plan remains appropriate    Co-evaluation             End of Session Equipment Utilized During Treatment: Gait belt Activity Tolerance: Patient tolerated treatment well Patient left: in bed;with call bell/phone within reach;with nursing/sitter in room     Time: [id number] PT Time  Calculation (min) (ACUTE ONLY): 24 min  Charges:  $Gait Training: 8-22 mins $Therapeutic Exercise: 8-22 mins                    G Codes:      [name] 2016-04-06, 4:35 PM

## 2016-03-17 DIAGNOSIS — E785 Hyperlipidemia, unspecified: Secondary | ICD-10-CM | POA: Diagnosis not present

## 2016-03-17 DIAGNOSIS — E039 Hypothyroidism, unspecified: Secondary | ICD-10-CM | POA: Diagnosis not present

## 2016-03-17 DIAGNOSIS — Z96651 Presence of right artificial knee joint: Secondary | ICD-10-CM | POA: Diagnosis not present

## 2016-03-17 DIAGNOSIS — Z7982 Long term (current) use of aspirin: Secondary | ICD-10-CM | POA: Diagnosis not present

## 2016-03-17 DIAGNOSIS — Z471 Aftercare following joint replacement surgery: Secondary | ICD-10-CM | POA: Diagnosis not present

## 2016-03-17 DIAGNOSIS — M1991 Primary osteoarthritis, unspecified site: Secondary | ICD-10-CM | POA: Diagnosis not present

## 2016-03-17 DIAGNOSIS — K219 Gastro-esophageal reflux disease without esophagitis: Secondary | ICD-10-CM | POA: Diagnosis not present

## 2016-03-17 DIAGNOSIS — I10 Essential (primary) hypertension: Secondary | ICD-10-CM | POA: Diagnosis not present

## 2016-03-18 DIAGNOSIS — M1991 Primary osteoarthritis, unspecified site: Secondary | ICD-10-CM | POA: Diagnosis not present

## 2016-03-18 DIAGNOSIS — Z96651 Presence of right artificial knee joint: Secondary | ICD-10-CM | POA: Diagnosis not present

## 2016-03-18 DIAGNOSIS — Z471 Aftercare following joint replacement surgery: Secondary | ICD-10-CM | POA: Diagnosis not present

## 2016-03-18 DIAGNOSIS — K219 Gastro-esophageal reflux disease without esophagitis: Secondary | ICD-10-CM | POA: Diagnosis not present

## 2016-03-18 DIAGNOSIS — E785 Hyperlipidemia, unspecified: Secondary | ICD-10-CM | POA: Diagnosis not present

## 2016-03-18 DIAGNOSIS — I10 Essential (primary) hypertension: Secondary | ICD-10-CM | POA: Diagnosis not present

## 2016-03-18 DIAGNOSIS — E039 Hypothyroidism, unspecified: Secondary | ICD-10-CM | POA: Diagnosis not present

## 2016-03-18 DIAGNOSIS — Z7982 Long term (current) use of aspirin: Secondary | ICD-10-CM | POA: Diagnosis not present

## 2016-03-19 DIAGNOSIS — Z471 Aftercare following joint replacement surgery: Secondary | ICD-10-CM | POA: Diagnosis not present

## 2016-03-19 DIAGNOSIS — M1991 Primary osteoarthritis, unspecified site: Secondary | ICD-10-CM | POA: Diagnosis not present

## 2016-03-19 DIAGNOSIS — K219 Gastro-esophageal reflux disease without esophagitis: Secondary | ICD-10-CM | POA: Diagnosis not present

## 2016-03-19 DIAGNOSIS — Z96651 Presence of right artificial knee joint: Secondary | ICD-10-CM | POA: Diagnosis not present

## 2016-03-19 DIAGNOSIS — Z7982 Long term (current) use of aspirin: Secondary | ICD-10-CM | POA: Diagnosis not present

## 2016-03-19 DIAGNOSIS — E785 Hyperlipidemia, unspecified: Secondary | ICD-10-CM | POA: Diagnosis not present

## 2016-03-19 DIAGNOSIS — E039 Hypothyroidism, unspecified: Secondary | ICD-10-CM | POA: Diagnosis not present

## 2016-03-19 DIAGNOSIS — I10 Essential (primary) hypertension: Secondary | ICD-10-CM | POA: Diagnosis not present

## 2016-03-20 DIAGNOSIS — Z96651 Presence of right artificial knee joint: Secondary | ICD-10-CM | POA: Diagnosis not present

## 2016-03-20 DIAGNOSIS — Z471 Aftercare following joint replacement surgery: Secondary | ICD-10-CM | POA: Diagnosis not present

## 2016-03-24 DIAGNOSIS — E785 Hyperlipidemia, unspecified: Secondary | ICD-10-CM | POA: Diagnosis not present

## 2016-03-24 DIAGNOSIS — E039 Hypothyroidism, unspecified: Secondary | ICD-10-CM | POA: Diagnosis not present

## 2016-03-24 DIAGNOSIS — I10 Essential (primary) hypertension: Secondary | ICD-10-CM | POA: Diagnosis not present

## 2016-03-24 DIAGNOSIS — Z471 Aftercare following joint replacement surgery: Secondary | ICD-10-CM | POA: Diagnosis not present

## 2016-03-24 DIAGNOSIS — K219 Gastro-esophageal reflux disease without esophagitis: Secondary | ICD-10-CM | POA: Diagnosis not present

## 2016-03-24 DIAGNOSIS — Z96651 Presence of right artificial knee joint: Secondary | ICD-10-CM | POA: Diagnosis not present

## 2016-03-24 DIAGNOSIS — Z7982 Long term (current) use of aspirin: Secondary | ICD-10-CM | POA: Diagnosis not present

## 2016-03-24 DIAGNOSIS — M1991 Primary osteoarthritis, unspecified site: Secondary | ICD-10-CM | POA: Diagnosis not present

## 2016-03-25 DIAGNOSIS — Z96651 Presence of right artificial knee joint: Secondary | ICD-10-CM | POA: Diagnosis not present

## 2016-03-29 DIAGNOSIS — Z7982 Long term (current) use of aspirin: Secondary | ICD-10-CM | POA: Diagnosis not present

## 2016-03-29 DIAGNOSIS — E039 Hypothyroidism, unspecified: Secondary | ICD-10-CM | POA: Diagnosis not present

## 2016-03-29 DIAGNOSIS — M1991 Primary osteoarthritis, unspecified site: Secondary | ICD-10-CM | POA: Diagnosis not present

## 2016-03-29 DIAGNOSIS — Z96651 Presence of right artificial knee joint: Secondary | ICD-10-CM | POA: Diagnosis not present

## 2016-03-29 DIAGNOSIS — Z471 Aftercare following joint replacement surgery: Secondary | ICD-10-CM | POA: Diagnosis not present

## 2016-03-29 DIAGNOSIS — K219 Gastro-esophageal reflux disease without esophagitis: Secondary | ICD-10-CM | POA: Diagnosis not present

## 2016-03-29 DIAGNOSIS — I10 Essential (primary) hypertension: Secondary | ICD-10-CM | POA: Diagnosis not present

## 2016-03-29 DIAGNOSIS — E785 Hyperlipidemia, unspecified: Secondary | ICD-10-CM | POA: Diagnosis not present

## 2016-03-30 DIAGNOSIS — I10 Essential (primary) hypertension: Secondary | ICD-10-CM | POA: Diagnosis not present

## 2016-03-30 DIAGNOSIS — E039 Hypothyroidism, unspecified: Secondary | ICD-10-CM | POA: Diagnosis not present

## 2016-03-30 DIAGNOSIS — K219 Gastro-esophageal reflux disease without esophagitis: Secondary | ICD-10-CM | POA: Diagnosis not present

## 2016-03-30 DIAGNOSIS — Z96651 Presence of right artificial knee joint: Secondary | ICD-10-CM | POA: Diagnosis not present

## 2016-03-30 DIAGNOSIS — E785 Hyperlipidemia, unspecified: Secondary | ICD-10-CM | POA: Diagnosis not present

## 2016-03-30 DIAGNOSIS — Z471 Aftercare following joint replacement surgery: Secondary | ICD-10-CM | POA: Diagnosis not present

## 2016-03-30 DIAGNOSIS — M1991 Primary osteoarthritis, unspecified site: Secondary | ICD-10-CM | POA: Diagnosis not present

## 2016-03-30 DIAGNOSIS — Z7982 Long term (current) use of aspirin: Secondary | ICD-10-CM | POA: Diagnosis not present

## 2016-03-31 DIAGNOSIS — Z96651 Presence of right artificial knee joint: Secondary | ICD-10-CM | POA: Diagnosis not present

## 2016-03-31 DIAGNOSIS — M6281 Muscle weakness (generalized): Secondary | ICD-10-CM | POA: Diagnosis not present

## 2016-03-31 DIAGNOSIS — M25671 Stiffness of right ankle, not elsewhere classified: Secondary | ICD-10-CM | POA: Diagnosis not present

## 2016-03-31 DIAGNOSIS — M25561 Pain in right knee: Secondary | ICD-10-CM | POA: Diagnosis not present

## 2016-03-31 DIAGNOSIS — M25661 Stiffness of right knee, not elsewhere classified: Secondary | ICD-10-CM | POA: Diagnosis not present

## 2016-03-31 DIAGNOSIS — R600 Localized edema: Secondary | ICD-10-CM | POA: Diagnosis not present

## 2016-03-31 DIAGNOSIS — M25672 Stiffness of left ankle, not elsewhere classified: Secondary | ICD-10-CM | POA: Diagnosis not present

## 2016-03-31 DIAGNOSIS — Z471 Aftercare following joint replacement surgery: Secondary | ICD-10-CM | POA: Diagnosis not present

## 2016-03-31 DIAGNOSIS — R2689 Other abnormalities of gait and mobility: Secondary | ICD-10-CM | POA: Diagnosis not present

## 2016-03-31 DIAGNOSIS — M21371 Foot drop, right foot: Secondary | ICD-10-CM | POA: Diagnosis not present

## 2016-04-02 DIAGNOSIS — Z96651 Presence of right artificial knee joint: Secondary | ICD-10-CM | POA: Diagnosis not present

## 2016-04-02 DIAGNOSIS — M25661 Stiffness of right knee, not elsewhere classified: Secondary | ICD-10-CM | POA: Diagnosis not present

## 2016-04-02 DIAGNOSIS — M25561 Pain in right knee: Secondary | ICD-10-CM | POA: Diagnosis not present

## 2016-04-02 DIAGNOSIS — R600 Localized edema: Secondary | ICD-10-CM | POA: Diagnosis not present

## 2016-04-02 DIAGNOSIS — M25672 Stiffness of left ankle, not elsewhere classified: Secondary | ICD-10-CM | POA: Diagnosis not present

## 2016-04-02 DIAGNOSIS — M6281 Muscle weakness (generalized): Secondary | ICD-10-CM | POA: Diagnosis not present

## 2016-04-02 DIAGNOSIS — Z471 Aftercare following joint replacement surgery: Secondary | ICD-10-CM | POA: Diagnosis not present

## 2016-04-02 DIAGNOSIS — M25671 Stiffness of right ankle, not elsewhere classified: Secondary | ICD-10-CM | POA: Diagnosis not present

## 2016-04-02 DIAGNOSIS — R2689 Other abnormalities of gait and mobility: Secondary | ICD-10-CM | POA: Diagnosis not present

## 2016-04-05 DIAGNOSIS — M25671 Stiffness of right ankle, not elsewhere classified: Secondary | ICD-10-CM | POA: Diagnosis not present

## 2016-04-05 DIAGNOSIS — R2689 Other abnormalities of gait and mobility: Secondary | ICD-10-CM | POA: Diagnosis not present

## 2016-04-05 DIAGNOSIS — M25561 Pain in right knee: Secondary | ICD-10-CM | POA: Diagnosis not present

## 2016-04-05 DIAGNOSIS — M25661 Stiffness of right knee, not elsewhere classified: Secondary | ICD-10-CM | POA: Diagnosis not present

## 2016-04-05 DIAGNOSIS — R600 Localized edema: Secondary | ICD-10-CM | POA: Diagnosis not present

## 2016-04-05 DIAGNOSIS — Z96651 Presence of right artificial knee joint: Secondary | ICD-10-CM | POA: Diagnosis not present

## 2016-04-05 DIAGNOSIS — M25672 Stiffness of left ankle, not elsewhere classified: Secondary | ICD-10-CM | POA: Diagnosis not present

## 2016-04-05 DIAGNOSIS — M6281 Muscle weakness (generalized): Secondary | ICD-10-CM | POA: Diagnosis not present

## 2016-04-05 DIAGNOSIS — Z471 Aftercare following joint replacement surgery: Secondary | ICD-10-CM | POA: Diagnosis not present

## 2016-04-07 DIAGNOSIS — M25661 Stiffness of right knee, not elsewhere classified: Secondary | ICD-10-CM | POA: Diagnosis not present

## 2016-04-07 DIAGNOSIS — R2689 Other abnormalities of gait and mobility: Secondary | ICD-10-CM | POA: Diagnosis not present

## 2016-04-07 DIAGNOSIS — M25672 Stiffness of left ankle, not elsewhere classified: Secondary | ICD-10-CM | POA: Diagnosis not present

## 2016-04-07 DIAGNOSIS — Z96651 Presence of right artificial knee joint: Secondary | ICD-10-CM | POA: Diagnosis not present

## 2016-04-07 DIAGNOSIS — M25671 Stiffness of right ankle, not elsewhere classified: Secondary | ICD-10-CM | POA: Diagnosis not present

## 2016-04-07 DIAGNOSIS — R600 Localized edema: Secondary | ICD-10-CM | POA: Diagnosis not present

## 2016-04-07 DIAGNOSIS — M6281 Muscle weakness (generalized): Secondary | ICD-10-CM | POA: Diagnosis not present

## 2016-04-07 DIAGNOSIS — M25561 Pain in right knee: Secondary | ICD-10-CM | POA: Diagnosis not present

## 2016-04-07 DIAGNOSIS — Z471 Aftercare following joint replacement surgery: Secondary | ICD-10-CM | POA: Diagnosis not present

## 2016-04-09 DIAGNOSIS — M25661 Stiffness of right knee, not elsewhere classified: Secondary | ICD-10-CM | POA: Diagnosis not present

## 2016-04-09 DIAGNOSIS — M25671 Stiffness of right ankle, not elsewhere classified: Secondary | ICD-10-CM | POA: Diagnosis not present

## 2016-04-09 DIAGNOSIS — Z471 Aftercare following joint replacement surgery: Secondary | ICD-10-CM | POA: Diagnosis not present

## 2016-04-09 DIAGNOSIS — R600 Localized edema: Secondary | ICD-10-CM | POA: Diagnosis not present

## 2016-04-09 DIAGNOSIS — M6281 Muscle weakness (generalized): Secondary | ICD-10-CM | POA: Diagnosis not present

## 2016-04-09 DIAGNOSIS — M25672 Stiffness of left ankle, not elsewhere classified: Secondary | ICD-10-CM | POA: Diagnosis not present

## 2016-04-09 DIAGNOSIS — M25561 Pain in right knee: Secondary | ICD-10-CM | POA: Diagnosis not present

## 2016-04-09 DIAGNOSIS — R2689 Other abnormalities of gait and mobility: Secondary | ICD-10-CM | POA: Diagnosis not present

## 2016-04-09 DIAGNOSIS — Z96651 Presence of right artificial knee joint: Secondary | ICD-10-CM | POA: Diagnosis not present

## 2016-04-12 DIAGNOSIS — R600 Localized edema: Secondary | ICD-10-CM | POA: Diagnosis not present

## 2016-04-12 DIAGNOSIS — Z96651 Presence of right artificial knee joint: Secondary | ICD-10-CM | POA: Diagnosis not present

## 2016-04-12 DIAGNOSIS — M25672 Stiffness of left ankle, not elsewhere classified: Secondary | ICD-10-CM | POA: Diagnosis not present

## 2016-04-12 DIAGNOSIS — M25561 Pain in right knee: Secondary | ICD-10-CM | POA: Diagnosis not present

## 2016-04-12 DIAGNOSIS — R2689 Other abnormalities of gait and mobility: Secondary | ICD-10-CM | POA: Diagnosis not present

## 2016-04-12 DIAGNOSIS — M25661 Stiffness of right knee, not elsewhere classified: Secondary | ICD-10-CM | POA: Diagnosis not present

## 2016-04-12 DIAGNOSIS — Z471 Aftercare following joint replacement surgery: Secondary | ICD-10-CM | POA: Diagnosis not present

## 2016-04-12 DIAGNOSIS — M6281 Muscle weakness (generalized): Secondary | ICD-10-CM | POA: Diagnosis not present

## 2016-04-12 DIAGNOSIS — M25671 Stiffness of right ankle, not elsewhere classified: Secondary | ICD-10-CM | POA: Diagnosis not present

## 2016-04-14 DIAGNOSIS — M6281 Muscle weakness (generalized): Secondary | ICD-10-CM | POA: Diagnosis not present

## 2016-04-14 DIAGNOSIS — M25672 Stiffness of left ankle, not elsewhere classified: Secondary | ICD-10-CM | POA: Diagnosis not present

## 2016-04-14 DIAGNOSIS — Z471 Aftercare following joint replacement surgery: Secondary | ICD-10-CM | POA: Diagnosis not present

## 2016-04-14 DIAGNOSIS — R2689 Other abnormalities of gait and mobility: Secondary | ICD-10-CM | POA: Diagnosis not present

## 2016-04-14 DIAGNOSIS — M25661 Stiffness of right knee, not elsewhere classified: Secondary | ICD-10-CM | POA: Diagnosis not present

## 2016-04-14 DIAGNOSIS — M25561 Pain in right knee: Secondary | ICD-10-CM | POA: Diagnosis not present

## 2016-04-14 DIAGNOSIS — R600 Localized edema: Secondary | ICD-10-CM | POA: Diagnosis not present

## 2016-04-14 DIAGNOSIS — M25671 Stiffness of right ankle, not elsewhere classified: Secondary | ICD-10-CM | POA: Diagnosis not present

## 2016-04-14 DIAGNOSIS — Z96651 Presence of right artificial knee joint: Secondary | ICD-10-CM | POA: Diagnosis not present

## 2016-04-16 DIAGNOSIS — M25672 Stiffness of left ankle, not elsewhere classified: Secondary | ICD-10-CM | POA: Diagnosis not present

## 2016-04-16 DIAGNOSIS — M25561 Pain in right knee: Secondary | ICD-10-CM | POA: Diagnosis not present

## 2016-04-16 DIAGNOSIS — M25661 Stiffness of right knee, not elsewhere classified: Secondary | ICD-10-CM | POA: Diagnosis not present

## 2016-04-16 DIAGNOSIS — Z96651 Presence of right artificial knee joint: Secondary | ICD-10-CM | POA: Diagnosis not present

## 2016-04-16 DIAGNOSIS — Z471 Aftercare following joint replacement surgery: Secondary | ICD-10-CM | POA: Diagnosis not present

## 2016-04-16 DIAGNOSIS — R2689 Other abnormalities of gait and mobility: Secondary | ICD-10-CM | POA: Diagnosis not present

## 2016-04-16 DIAGNOSIS — M6281 Muscle weakness (generalized): Secondary | ICD-10-CM | POA: Diagnosis not present

## 2016-04-16 DIAGNOSIS — R600 Localized edema: Secondary | ICD-10-CM | POA: Diagnosis not present

## 2016-04-16 DIAGNOSIS — M25671 Stiffness of right ankle, not elsewhere classified: Secondary | ICD-10-CM | POA: Diagnosis not present

## 2016-04-19 DIAGNOSIS — R2689 Other abnormalities of gait and mobility: Secondary | ICD-10-CM | POA: Diagnosis not present

## 2016-04-19 DIAGNOSIS — M25561 Pain in right knee: Secondary | ICD-10-CM | POA: Diagnosis not present

## 2016-04-19 DIAGNOSIS — M6281 Muscle weakness (generalized): Secondary | ICD-10-CM | POA: Diagnosis not present

## 2016-04-19 DIAGNOSIS — Z471 Aftercare following joint replacement surgery: Secondary | ICD-10-CM | POA: Diagnosis not present

## 2016-04-19 DIAGNOSIS — M25671 Stiffness of right ankle, not elsewhere classified: Secondary | ICD-10-CM | POA: Diagnosis not present

## 2016-04-19 DIAGNOSIS — M25661 Stiffness of right knee, not elsewhere classified: Secondary | ICD-10-CM | POA: Diagnosis not present

## 2016-04-19 DIAGNOSIS — Z96651 Presence of right artificial knee joint: Secondary | ICD-10-CM | POA: Diagnosis not present

## 2016-04-19 DIAGNOSIS — R600 Localized edema: Secondary | ICD-10-CM | POA: Diagnosis not present

## 2016-04-19 DIAGNOSIS — M25672 Stiffness of left ankle, not elsewhere classified: Secondary | ICD-10-CM | POA: Diagnosis not present

## 2016-04-21 DIAGNOSIS — Z471 Aftercare following joint replacement surgery: Secondary | ICD-10-CM | POA: Diagnosis not present

## 2016-04-21 DIAGNOSIS — M25672 Stiffness of left ankle, not elsewhere classified: Secondary | ICD-10-CM | POA: Diagnosis not present

## 2016-04-21 DIAGNOSIS — M6281 Muscle weakness (generalized): Secondary | ICD-10-CM | POA: Diagnosis not present

## 2016-04-21 DIAGNOSIS — R2689 Other abnormalities of gait and mobility: Secondary | ICD-10-CM | POA: Diagnosis not present

## 2016-04-21 DIAGNOSIS — Z96651 Presence of right artificial knee joint: Secondary | ICD-10-CM | POA: Diagnosis not present

## 2016-04-21 DIAGNOSIS — R600 Localized edema: Secondary | ICD-10-CM | POA: Diagnosis not present

## 2016-04-21 DIAGNOSIS — M25661 Stiffness of right knee, not elsewhere classified: Secondary | ICD-10-CM | POA: Diagnosis not present

## 2016-04-21 DIAGNOSIS — M25671 Stiffness of right ankle, not elsewhere classified: Secondary | ICD-10-CM | POA: Diagnosis not present

## 2016-04-21 DIAGNOSIS — M25561 Pain in right knee: Secondary | ICD-10-CM | POA: Diagnosis not present

## 2016-04-22 DIAGNOSIS — Z471 Aftercare following joint replacement surgery: Secondary | ICD-10-CM | POA: Diagnosis not present

## 2016-04-22 DIAGNOSIS — Z96651 Presence of right artificial knee joint: Secondary | ICD-10-CM | POA: Diagnosis not present

## 2016-04-23 DIAGNOSIS — M25561 Pain in right knee: Secondary | ICD-10-CM | POA: Diagnosis not present

## 2016-04-23 DIAGNOSIS — M6281 Muscle weakness (generalized): Secondary | ICD-10-CM | POA: Diagnosis not present

## 2016-04-23 DIAGNOSIS — M25671 Stiffness of right ankle, not elsewhere classified: Secondary | ICD-10-CM | POA: Diagnosis not present

## 2016-04-23 DIAGNOSIS — M25661 Stiffness of right knee, not elsewhere classified: Secondary | ICD-10-CM | POA: Diagnosis not present

## 2016-04-23 DIAGNOSIS — M25672 Stiffness of left ankle, not elsewhere classified: Secondary | ICD-10-CM | POA: Diagnosis not present

## 2016-04-23 DIAGNOSIS — R2689 Other abnormalities of gait and mobility: Secondary | ICD-10-CM | POA: Diagnosis not present

## 2016-04-23 DIAGNOSIS — Z96651 Presence of right artificial knee joint: Secondary | ICD-10-CM | POA: Diagnosis not present

## 2016-04-23 DIAGNOSIS — R600 Localized edema: Secondary | ICD-10-CM | POA: Diagnosis not present

## 2016-04-23 DIAGNOSIS — Z471 Aftercare following joint replacement surgery: Secondary | ICD-10-CM | POA: Diagnosis not present

## 2016-04-26 DIAGNOSIS — M25561 Pain in right knee: Secondary | ICD-10-CM | POA: Diagnosis not present

## 2016-04-26 DIAGNOSIS — M25661 Stiffness of right knee, not elsewhere classified: Secondary | ICD-10-CM | POA: Diagnosis not present

## 2016-04-26 DIAGNOSIS — R2689 Other abnormalities of gait and mobility: Secondary | ICD-10-CM | POA: Diagnosis not present

## 2016-04-26 DIAGNOSIS — R600 Localized edema: Secondary | ICD-10-CM | POA: Diagnosis not present

## 2016-04-26 DIAGNOSIS — Z96651 Presence of right artificial knee joint: Secondary | ICD-10-CM | POA: Diagnosis not present

## 2016-04-26 DIAGNOSIS — Z471 Aftercare following joint replacement surgery: Secondary | ICD-10-CM | POA: Diagnosis not present

## 2016-04-26 DIAGNOSIS — M6281 Muscle weakness (generalized): Secondary | ICD-10-CM | POA: Diagnosis not present

## 2016-04-26 DIAGNOSIS — M25671 Stiffness of right ankle, not elsewhere classified: Secondary | ICD-10-CM | POA: Diagnosis not present

## 2016-04-26 DIAGNOSIS — M25672 Stiffness of left ankle, not elsewhere classified: Secondary | ICD-10-CM | POA: Diagnosis not present

## 2016-04-28 DIAGNOSIS — M25671 Stiffness of right ankle, not elsewhere classified: Secondary | ICD-10-CM | POA: Diagnosis not present

## 2016-04-28 DIAGNOSIS — M25672 Stiffness of left ankle, not elsewhere classified: Secondary | ICD-10-CM | POA: Diagnosis not present

## 2016-04-28 DIAGNOSIS — M6281 Muscle weakness (generalized): Secondary | ICD-10-CM | POA: Diagnosis not present

## 2016-04-28 DIAGNOSIS — Z471 Aftercare following joint replacement surgery: Secondary | ICD-10-CM | POA: Diagnosis not present

## 2016-04-28 DIAGNOSIS — M25561 Pain in right knee: Secondary | ICD-10-CM | POA: Diagnosis not present

## 2016-04-28 DIAGNOSIS — R600 Localized edema: Secondary | ICD-10-CM | POA: Diagnosis not present

## 2016-04-28 DIAGNOSIS — R2689 Other abnormalities of gait and mobility: Secondary | ICD-10-CM | POA: Diagnosis not present

## 2016-04-28 DIAGNOSIS — M25661 Stiffness of right knee, not elsewhere classified: Secondary | ICD-10-CM | POA: Diagnosis not present

## 2016-04-28 DIAGNOSIS — Z96651 Presence of right artificial knee joint: Secondary | ICD-10-CM | POA: Diagnosis not present

## 2016-04-30 DIAGNOSIS — M25671 Stiffness of right ankle, not elsewhere classified: Secondary | ICD-10-CM | POA: Diagnosis not present

## 2016-04-30 DIAGNOSIS — Z471 Aftercare following joint replacement surgery: Secondary | ICD-10-CM | POA: Diagnosis not present

## 2016-04-30 DIAGNOSIS — M25561 Pain in right knee: Secondary | ICD-10-CM | POA: Diagnosis not present

## 2016-04-30 DIAGNOSIS — R2689 Other abnormalities of gait and mobility: Secondary | ICD-10-CM | POA: Diagnosis not present

## 2016-04-30 DIAGNOSIS — M25661 Stiffness of right knee, not elsewhere classified: Secondary | ICD-10-CM | POA: Diagnosis not present

## 2016-04-30 DIAGNOSIS — M25672 Stiffness of left ankle, not elsewhere classified: Secondary | ICD-10-CM | POA: Diagnosis not present

## 2016-04-30 DIAGNOSIS — Z96651 Presence of right artificial knee joint: Secondary | ICD-10-CM | POA: Diagnosis not present

## 2016-04-30 DIAGNOSIS — M6281 Muscle weakness (generalized): Secondary | ICD-10-CM | POA: Diagnosis not present

## 2016-04-30 DIAGNOSIS — R600 Localized edema: Secondary | ICD-10-CM | POA: Diagnosis not present

## 2016-05-03 DIAGNOSIS — M25671 Stiffness of right ankle, not elsewhere classified: Secondary | ICD-10-CM | POA: Diagnosis not present

## 2016-05-03 DIAGNOSIS — M25561 Pain in right knee: Secondary | ICD-10-CM | POA: Diagnosis not present

## 2016-05-03 DIAGNOSIS — Z96651 Presence of right artificial knee joint: Secondary | ICD-10-CM | POA: Diagnosis not present

## 2016-05-03 DIAGNOSIS — M6281 Muscle weakness (generalized): Secondary | ICD-10-CM | POA: Diagnosis not present

## 2016-05-03 DIAGNOSIS — M25672 Stiffness of left ankle, not elsewhere classified: Secondary | ICD-10-CM | POA: Diagnosis not present

## 2016-05-03 DIAGNOSIS — R600 Localized edema: Secondary | ICD-10-CM | POA: Diagnosis not present

## 2016-05-03 DIAGNOSIS — M25661 Stiffness of right knee, not elsewhere classified: Secondary | ICD-10-CM | POA: Diagnosis not present

## 2016-05-03 DIAGNOSIS — R2689 Other abnormalities of gait and mobility: Secondary | ICD-10-CM | POA: Diagnosis not present

## 2016-05-03 DIAGNOSIS — Z471 Aftercare following joint replacement surgery: Secondary | ICD-10-CM | POA: Diagnosis not present

## 2016-05-05 DIAGNOSIS — R2689 Other abnormalities of gait and mobility: Secondary | ICD-10-CM | POA: Diagnosis not present

## 2016-05-05 DIAGNOSIS — R600 Localized edema: Secondary | ICD-10-CM | POA: Diagnosis not present

## 2016-05-05 DIAGNOSIS — M25671 Stiffness of right ankle, not elsewhere classified: Secondary | ICD-10-CM | POA: Diagnosis not present

## 2016-05-05 DIAGNOSIS — M25661 Stiffness of right knee, not elsewhere classified: Secondary | ICD-10-CM | POA: Diagnosis not present

## 2016-05-05 DIAGNOSIS — Z96651 Presence of right artificial knee joint: Secondary | ICD-10-CM | POA: Diagnosis not present

## 2016-05-05 DIAGNOSIS — M6281 Muscle weakness (generalized): Secondary | ICD-10-CM | POA: Diagnosis not present

## 2016-05-05 DIAGNOSIS — M25561 Pain in right knee: Secondary | ICD-10-CM | POA: Diagnosis not present

## 2016-05-05 DIAGNOSIS — M25672 Stiffness of left ankle, not elsewhere classified: Secondary | ICD-10-CM | POA: Diagnosis not present

## 2016-05-05 DIAGNOSIS — Z471 Aftercare following joint replacement surgery: Secondary | ICD-10-CM | POA: Diagnosis not present

## 2016-05-07 DIAGNOSIS — R600 Localized edema: Secondary | ICD-10-CM | POA: Diagnosis not present

## 2016-05-07 DIAGNOSIS — M25672 Stiffness of left ankle, not elsewhere classified: Secondary | ICD-10-CM | POA: Diagnosis not present

## 2016-05-07 DIAGNOSIS — Z471 Aftercare following joint replacement surgery: Secondary | ICD-10-CM | POA: Diagnosis not present

## 2016-05-07 DIAGNOSIS — M6281 Muscle weakness (generalized): Secondary | ICD-10-CM | POA: Diagnosis not present

## 2016-05-07 DIAGNOSIS — R2689 Other abnormalities of gait and mobility: Secondary | ICD-10-CM | POA: Diagnosis not present

## 2016-05-07 DIAGNOSIS — M25561 Pain in right knee: Secondary | ICD-10-CM | POA: Diagnosis not present

## 2016-05-07 DIAGNOSIS — Z96651 Presence of right artificial knee joint: Secondary | ICD-10-CM | POA: Diagnosis not present

## 2016-05-07 DIAGNOSIS — M25671 Stiffness of right ankle, not elsewhere classified: Secondary | ICD-10-CM | POA: Diagnosis not present

## 2016-05-07 DIAGNOSIS — M25661 Stiffness of right knee, not elsewhere classified: Secondary | ICD-10-CM | POA: Diagnosis not present

## 2016-05-10 DIAGNOSIS — Z96651 Presence of right artificial knee joint: Secondary | ICD-10-CM | POA: Diagnosis not present

## 2016-05-10 DIAGNOSIS — M25661 Stiffness of right knee, not elsewhere classified: Secondary | ICD-10-CM | POA: Diagnosis not present

## 2016-05-10 DIAGNOSIS — Z471 Aftercare following joint replacement surgery: Secondary | ICD-10-CM | POA: Diagnosis not present

## 2016-05-10 DIAGNOSIS — M25561 Pain in right knee: Secondary | ICD-10-CM | POA: Diagnosis not present

## 2016-05-10 DIAGNOSIS — R600 Localized edema: Secondary | ICD-10-CM | POA: Diagnosis not present

## 2016-05-10 DIAGNOSIS — R2689 Other abnormalities of gait and mobility: Secondary | ICD-10-CM | POA: Diagnosis not present

## 2016-05-10 DIAGNOSIS — M6281 Muscle weakness (generalized): Secondary | ICD-10-CM | POA: Diagnosis not present

## 2016-05-10 DIAGNOSIS — M25671 Stiffness of right ankle, not elsewhere classified: Secondary | ICD-10-CM | POA: Diagnosis not present

## 2016-05-10 DIAGNOSIS — M25672 Stiffness of left ankle, not elsewhere classified: Secondary | ICD-10-CM | POA: Diagnosis not present

## 2016-05-12 DIAGNOSIS — R600 Localized edema: Secondary | ICD-10-CM | POA: Diagnosis not present

## 2016-05-12 DIAGNOSIS — R2689 Other abnormalities of gait and mobility: Secondary | ICD-10-CM | POA: Diagnosis not present

## 2016-05-12 DIAGNOSIS — M25561 Pain in right knee: Secondary | ICD-10-CM | POA: Diagnosis not present

## 2016-05-12 DIAGNOSIS — M25672 Stiffness of left ankle, not elsewhere classified: Secondary | ICD-10-CM | POA: Diagnosis not present

## 2016-05-12 DIAGNOSIS — M25671 Stiffness of right ankle, not elsewhere classified: Secondary | ICD-10-CM | POA: Diagnosis not present

## 2016-05-12 DIAGNOSIS — Z471 Aftercare following joint replacement surgery: Secondary | ICD-10-CM | POA: Diagnosis not present

## 2016-05-12 DIAGNOSIS — Z96651 Presence of right artificial knee joint: Secondary | ICD-10-CM | POA: Diagnosis not present

## 2016-05-12 DIAGNOSIS — M25661 Stiffness of right knee, not elsewhere classified: Secondary | ICD-10-CM | POA: Diagnosis not present

## 2016-05-12 DIAGNOSIS — M6281 Muscle weakness (generalized): Secondary | ICD-10-CM | POA: Diagnosis not present

## 2016-05-14 DIAGNOSIS — R600 Localized edema: Secondary | ICD-10-CM | POA: Diagnosis not present

## 2016-05-14 DIAGNOSIS — M25661 Stiffness of right knee, not elsewhere classified: Secondary | ICD-10-CM | POA: Diagnosis not present

## 2016-05-14 DIAGNOSIS — M25672 Stiffness of left ankle, not elsewhere classified: Secondary | ICD-10-CM | POA: Diagnosis not present

## 2016-05-14 DIAGNOSIS — Z471 Aftercare following joint replacement surgery: Secondary | ICD-10-CM | POA: Diagnosis not present

## 2016-05-14 DIAGNOSIS — M6281 Muscle weakness (generalized): Secondary | ICD-10-CM | POA: Diagnosis not present

## 2016-05-14 DIAGNOSIS — M25671 Stiffness of right ankle, not elsewhere classified: Secondary | ICD-10-CM | POA: Diagnosis not present

## 2016-05-14 DIAGNOSIS — R2689 Other abnormalities of gait and mobility: Secondary | ICD-10-CM | POA: Diagnosis not present

## 2016-05-14 DIAGNOSIS — M25561 Pain in right knee: Secondary | ICD-10-CM | POA: Diagnosis not present

## 2016-05-14 DIAGNOSIS — Z96651 Presence of right artificial knee joint: Secondary | ICD-10-CM | POA: Diagnosis not present

## 2016-05-17 DIAGNOSIS — Z471 Aftercare following joint replacement surgery: Secondary | ICD-10-CM | POA: Diagnosis not present

## 2016-05-17 DIAGNOSIS — R600 Localized edema: Secondary | ICD-10-CM | POA: Diagnosis not present

## 2016-05-17 DIAGNOSIS — M25672 Stiffness of left ankle, not elsewhere classified: Secondary | ICD-10-CM | POA: Diagnosis not present

## 2016-05-17 DIAGNOSIS — M25671 Stiffness of right ankle, not elsewhere classified: Secondary | ICD-10-CM | POA: Diagnosis not present

## 2016-05-17 DIAGNOSIS — Z96651 Presence of right artificial knee joint: Secondary | ICD-10-CM | POA: Diagnosis not present

## 2016-05-17 DIAGNOSIS — M6281 Muscle weakness (generalized): Secondary | ICD-10-CM | POA: Diagnosis not present

## 2016-05-17 DIAGNOSIS — M25561 Pain in right knee: Secondary | ICD-10-CM | POA: Diagnosis not present

## 2016-05-17 DIAGNOSIS — R2689 Other abnormalities of gait and mobility: Secondary | ICD-10-CM | POA: Diagnosis not present

## 2016-05-17 DIAGNOSIS — M25661 Stiffness of right knee, not elsewhere classified: Secondary | ICD-10-CM | POA: Diagnosis not present

## 2016-05-19 DIAGNOSIS — M6281 Muscle weakness (generalized): Secondary | ICD-10-CM | POA: Diagnosis not present

## 2016-05-19 DIAGNOSIS — M25661 Stiffness of right knee, not elsewhere classified: Secondary | ICD-10-CM | POA: Diagnosis not present

## 2016-05-19 DIAGNOSIS — R2689 Other abnormalities of gait and mobility: Secondary | ICD-10-CM | POA: Diagnosis not present

## 2016-05-19 DIAGNOSIS — R600 Localized edema: Secondary | ICD-10-CM | POA: Diagnosis not present

## 2016-05-19 DIAGNOSIS — M25671 Stiffness of right ankle, not elsewhere classified: Secondary | ICD-10-CM | POA: Diagnosis not present

## 2016-05-19 DIAGNOSIS — M25672 Stiffness of left ankle, not elsewhere classified: Secondary | ICD-10-CM | POA: Diagnosis not present

## 2016-05-19 DIAGNOSIS — M25561 Pain in right knee: Secondary | ICD-10-CM | POA: Diagnosis not present

## 2016-05-19 DIAGNOSIS — Z471 Aftercare following joint replacement surgery: Secondary | ICD-10-CM | POA: Diagnosis not present

## 2016-05-19 DIAGNOSIS — Z96651 Presence of right artificial knee joint: Secondary | ICD-10-CM | POA: Diagnosis not present

## 2016-05-20 DIAGNOSIS — Z23 Encounter for immunization: Secondary | ICD-10-CM | POA: Diagnosis not present

## 2016-05-21 DIAGNOSIS — M6281 Muscle weakness (generalized): Secondary | ICD-10-CM | POA: Diagnosis not present

## 2016-05-21 DIAGNOSIS — M25671 Stiffness of right ankle, not elsewhere classified: Secondary | ICD-10-CM | POA: Diagnosis not present

## 2016-05-21 DIAGNOSIS — M25661 Stiffness of right knee, not elsewhere classified: Secondary | ICD-10-CM | POA: Diagnosis not present

## 2016-05-21 DIAGNOSIS — Z96651 Presence of right artificial knee joint: Secondary | ICD-10-CM | POA: Diagnosis not present

## 2016-05-21 DIAGNOSIS — M25561 Pain in right knee: Secondary | ICD-10-CM | POA: Diagnosis not present

## 2016-05-21 DIAGNOSIS — M25672 Stiffness of left ankle, not elsewhere classified: Secondary | ICD-10-CM | POA: Diagnosis not present

## 2016-05-21 DIAGNOSIS — R600 Localized edema: Secondary | ICD-10-CM | POA: Diagnosis not present

## 2016-05-21 DIAGNOSIS — Z471 Aftercare following joint replacement surgery: Secondary | ICD-10-CM | POA: Diagnosis not present

## 2016-05-21 DIAGNOSIS — R2689 Other abnormalities of gait and mobility: Secondary | ICD-10-CM | POA: Diagnosis not present

## 2016-05-24 DIAGNOSIS — M25672 Stiffness of left ankle, not elsewhere classified: Secondary | ICD-10-CM | POA: Diagnosis not present

## 2016-05-24 DIAGNOSIS — M25671 Stiffness of right ankle, not elsewhere classified: Secondary | ICD-10-CM | POA: Diagnosis not present

## 2016-05-24 DIAGNOSIS — Z96651 Presence of right artificial knee joint: Secondary | ICD-10-CM | POA: Diagnosis not present

## 2016-05-24 DIAGNOSIS — R600 Localized edema: Secondary | ICD-10-CM | POA: Diagnosis not present

## 2016-05-24 DIAGNOSIS — Z471 Aftercare following joint replacement surgery: Secondary | ICD-10-CM | POA: Diagnosis not present

## 2016-05-24 DIAGNOSIS — R2689 Other abnormalities of gait and mobility: Secondary | ICD-10-CM | POA: Diagnosis not present

## 2016-05-24 DIAGNOSIS — M6281 Muscle weakness (generalized): Secondary | ICD-10-CM | POA: Diagnosis not present

## 2016-05-24 DIAGNOSIS — M25561 Pain in right knee: Secondary | ICD-10-CM | POA: Diagnosis not present

## 2016-05-24 DIAGNOSIS — M25661 Stiffness of right knee, not elsewhere classified: Secondary | ICD-10-CM | POA: Diagnosis not present

## 2016-05-26 DIAGNOSIS — M25671 Stiffness of right ankle, not elsewhere classified: Secondary | ICD-10-CM | POA: Diagnosis not present

## 2016-05-26 DIAGNOSIS — M25672 Stiffness of left ankle, not elsewhere classified: Secondary | ICD-10-CM | POA: Diagnosis not present

## 2016-05-26 DIAGNOSIS — M25661 Stiffness of right knee, not elsewhere classified: Secondary | ICD-10-CM | POA: Diagnosis not present

## 2016-05-26 DIAGNOSIS — Z471 Aftercare following joint replacement surgery: Secondary | ICD-10-CM | POA: Diagnosis not present

## 2016-05-26 DIAGNOSIS — R2689 Other abnormalities of gait and mobility: Secondary | ICD-10-CM | POA: Diagnosis not present

## 2016-05-26 DIAGNOSIS — M25561 Pain in right knee: Secondary | ICD-10-CM | POA: Diagnosis not present

## 2016-05-26 DIAGNOSIS — R600 Localized edema: Secondary | ICD-10-CM | POA: Diagnosis not present

## 2016-05-26 DIAGNOSIS — Z96651 Presence of right artificial knee joint: Secondary | ICD-10-CM | POA: Diagnosis not present

## 2016-05-26 DIAGNOSIS — M6281 Muscle weakness (generalized): Secondary | ICD-10-CM | POA: Diagnosis not present

## 2016-05-28 DIAGNOSIS — M25672 Stiffness of left ankle, not elsewhere classified: Secondary | ICD-10-CM | POA: Diagnosis not present

## 2016-05-28 DIAGNOSIS — M25671 Stiffness of right ankle, not elsewhere classified: Secondary | ICD-10-CM | POA: Diagnosis not present

## 2016-05-28 DIAGNOSIS — Z96651 Presence of right artificial knee joint: Secondary | ICD-10-CM | POA: Diagnosis not present

## 2016-05-28 DIAGNOSIS — M25661 Stiffness of right knee, not elsewhere classified: Secondary | ICD-10-CM | POA: Diagnosis not present

## 2016-05-28 DIAGNOSIS — R2689 Other abnormalities of gait and mobility: Secondary | ICD-10-CM | POA: Diagnosis not present

## 2016-05-28 DIAGNOSIS — M6281 Muscle weakness (generalized): Secondary | ICD-10-CM | POA: Diagnosis not present

## 2016-05-28 DIAGNOSIS — R600 Localized edema: Secondary | ICD-10-CM | POA: Diagnosis not present

## 2016-05-28 DIAGNOSIS — Z471 Aftercare following joint replacement surgery: Secondary | ICD-10-CM | POA: Diagnosis not present

## 2016-05-28 DIAGNOSIS — M25561 Pain in right knee: Secondary | ICD-10-CM | POA: Diagnosis not present

## 2016-05-31 DIAGNOSIS — R600 Localized edema: Secondary | ICD-10-CM | POA: Diagnosis not present

## 2016-05-31 DIAGNOSIS — M25561 Pain in right knee: Secondary | ICD-10-CM | POA: Diagnosis not present

## 2016-05-31 DIAGNOSIS — M25672 Stiffness of left ankle, not elsewhere classified: Secondary | ICD-10-CM | POA: Diagnosis not present

## 2016-05-31 DIAGNOSIS — R2689 Other abnormalities of gait and mobility: Secondary | ICD-10-CM | POA: Diagnosis not present

## 2016-05-31 DIAGNOSIS — Z96651 Presence of right artificial knee joint: Secondary | ICD-10-CM | POA: Diagnosis not present

## 2016-05-31 DIAGNOSIS — M25661 Stiffness of right knee, not elsewhere classified: Secondary | ICD-10-CM | POA: Diagnosis not present

## 2016-05-31 DIAGNOSIS — M25671 Stiffness of right ankle, not elsewhere classified: Secondary | ICD-10-CM | POA: Diagnosis not present

## 2016-05-31 DIAGNOSIS — Z471 Aftercare following joint replacement surgery: Secondary | ICD-10-CM | POA: Diagnosis not present

## 2016-05-31 DIAGNOSIS — M6281 Muscle weakness (generalized): Secondary | ICD-10-CM | POA: Diagnosis not present

## 2016-06-02 DIAGNOSIS — M25661 Stiffness of right knee, not elsewhere classified: Secondary | ICD-10-CM | POA: Diagnosis not present

## 2016-06-02 DIAGNOSIS — M6281 Muscle weakness (generalized): Secondary | ICD-10-CM | POA: Diagnosis not present

## 2016-06-02 DIAGNOSIS — M25671 Stiffness of right ankle, not elsewhere classified: Secondary | ICD-10-CM | POA: Diagnosis not present

## 2016-06-02 DIAGNOSIS — M25561 Pain in right knee: Secondary | ICD-10-CM | POA: Diagnosis not present

## 2016-06-02 DIAGNOSIS — Z471 Aftercare following joint replacement surgery: Secondary | ICD-10-CM | POA: Diagnosis not present

## 2016-06-02 DIAGNOSIS — R2689 Other abnormalities of gait and mobility: Secondary | ICD-10-CM | POA: Diagnosis not present

## 2016-06-02 DIAGNOSIS — M25672 Stiffness of left ankle, not elsewhere classified: Secondary | ICD-10-CM | POA: Diagnosis not present

## 2016-06-02 DIAGNOSIS — R600 Localized edema: Secondary | ICD-10-CM | POA: Diagnosis not present

## 2016-06-02 DIAGNOSIS — Z96651 Presence of right artificial knee joint: Secondary | ICD-10-CM | POA: Diagnosis not present

## 2016-06-04 DIAGNOSIS — M25661 Stiffness of right knee, not elsewhere classified: Secondary | ICD-10-CM | POA: Diagnosis not present

## 2016-06-04 DIAGNOSIS — R2689 Other abnormalities of gait and mobility: Secondary | ICD-10-CM | POA: Diagnosis not present

## 2016-06-04 DIAGNOSIS — M6281 Muscle weakness (generalized): Secondary | ICD-10-CM | POA: Diagnosis not present

## 2016-06-04 DIAGNOSIS — R600 Localized edema: Secondary | ICD-10-CM | POA: Diagnosis not present

## 2016-06-04 DIAGNOSIS — M25671 Stiffness of right ankle, not elsewhere classified: Secondary | ICD-10-CM | POA: Diagnosis not present

## 2016-06-04 DIAGNOSIS — Z96651 Presence of right artificial knee joint: Secondary | ICD-10-CM | POA: Diagnosis not present

## 2016-06-04 DIAGNOSIS — M25561 Pain in right knee: Secondary | ICD-10-CM | POA: Diagnosis not present

## 2016-06-04 DIAGNOSIS — Z471 Aftercare following joint replacement surgery: Secondary | ICD-10-CM | POA: Diagnosis not present

## 2016-06-04 DIAGNOSIS — M25672 Stiffness of left ankle, not elsewhere classified: Secondary | ICD-10-CM | POA: Diagnosis not present

## 2016-06-07 DIAGNOSIS — Z96651 Presence of right artificial knee joint: Secondary | ICD-10-CM | POA: Diagnosis not present

## 2016-06-07 DIAGNOSIS — R2689 Other abnormalities of gait and mobility: Secondary | ICD-10-CM | POA: Diagnosis not present

## 2016-06-07 DIAGNOSIS — M25672 Stiffness of left ankle, not elsewhere classified: Secondary | ICD-10-CM | POA: Diagnosis not present

## 2016-06-07 DIAGNOSIS — M6281 Muscle weakness (generalized): Secondary | ICD-10-CM | POA: Diagnosis not present

## 2016-06-07 DIAGNOSIS — R600 Localized edema: Secondary | ICD-10-CM | POA: Diagnosis not present

## 2016-06-07 DIAGNOSIS — M25661 Stiffness of right knee, not elsewhere classified: Secondary | ICD-10-CM | POA: Diagnosis not present

## 2016-06-07 DIAGNOSIS — M25561 Pain in right knee: Secondary | ICD-10-CM | POA: Diagnosis not present

## 2016-06-07 DIAGNOSIS — M25671 Stiffness of right ankle, not elsewhere classified: Secondary | ICD-10-CM | POA: Diagnosis not present

## 2016-06-07 DIAGNOSIS — Z471 Aftercare following joint replacement surgery: Secondary | ICD-10-CM | POA: Diagnosis not present

## 2016-06-09 DIAGNOSIS — Z471 Aftercare following joint replacement surgery: Secondary | ICD-10-CM | POA: Diagnosis not present

## 2016-06-09 DIAGNOSIS — M25561 Pain in right knee: Secondary | ICD-10-CM | POA: Diagnosis not present

## 2016-06-09 DIAGNOSIS — M25672 Stiffness of left ankle, not elsewhere classified: Secondary | ICD-10-CM | POA: Diagnosis not present

## 2016-06-09 DIAGNOSIS — R600 Localized edema: Secondary | ICD-10-CM | POA: Diagnosis not present

## 2016-06-09 DIAGNOSIS — M25671 Stiffness of right ankle, not elsewhere classified: Secondary | ICD-10-CM | POA: Diagnosis not present

## 2016-06-09 DIAGNOSIS — M6281 Muscle weakness (generalized): Secondary | ICD-10-CM | POA: Diagnosis not present

## 2016-06-09 DIAGNOSIS — R2689 Other abnormalities of gait and mobility: Secondary | ICD-10-CM | POA: Diagnosis not present

## 2016-06-09 DIAGNOSIS — Z96651 Presence of right artificial knee joint: Secondary | ICD-10-CM | POA: Diagnosis not present

## 2016-06-09 DIAGNOSIS — M25661 Stiffness of right knee, not elsewhere classified: Secondary | ICD-10-CM | POA: Diagnosis not present

## 2016-06-16 DIAGNOSIS — M25561 Pain in right knee: Secondary | ICD-10-CM | POA: Diagnosis not present

## 2016-06-16 DIAGNOSIS — Z471 Aftercare following joint replacement surgery: Secondary | ICD-10-CM | POA: Diagnosis not present

## 2016-06-16 DIAGNOSIS — R2689 Other abnormalities of gait and mobility: Secondary | ICD-10-CM | POA: Diagnosis not present

## 2016-06-16 DIAGNOSIS — M25661 Stiffness of right knee, not elsewhere classified: Secondary | ICD-10-CM | POA: Diagnosis not present

## 2016-06-16 DIAGNOSIS — M25671 Stiffness of right ankle, not elsewhere classified: Secondary | ICD-10-CM | POA: Diagnosis not present

## 2016-06-16 DIAGNOSIS — Z96651 Presence of right artificial knee joint: Secondary | ICD-10-CM | POA: Diagnosis not present

## 2016-06-16 DIAGNOSIS — R600 Localized edema: Secondary | ICD-10-CM | POA: Diagnosis not present

## 2016-06-16 DIAGNOSIS — M6281 Muscle weakness (generalized): Secondary | ICD-10-CM | POA: Diagnosis not present

## 2016-06-16 DIAGNOSIS — M25672 Stiffness of left ankle, not elsewhere classified: Secondary | ICD-10-CM | POA: Diagnosis not present

## 2016-06-18 DIAGNOSIS — R600 Localized edema: Secondary | ICD-10-CM | POA: Diagnosis not present

## 2016-06-18 DIAGNOSIS — M6281 Muscle weakness (generalized): Secondary | ICD-10-CM | POA: Diagnosis not present

## 2016-06-18 DIAGNOSIS — R2689 Other abnormalities of gait and mobility: Secondary | ICD-10-CM | POA: Diagnosis not present

## 2016-06-18 DIAGNOSIS — M25671 Stiffness of right ankle, not elsewhere classified: Secondary | ICD-10-CM | POA: Diagnosis not present

## 2016-06-18 DIAGNOSIS — M25561 Pain in right knee: Secondary | ICD-10-CM | POA: Diagnosis not present

## 2016-06-18 DIAGNOSIS — Z471 Aftercare following joint replacement surgery: Secondary | ICD-10-CM | POA: Diagnosis not present

## 2016-06-18 DIAGNOSIS — M25672 Stiffness of left ankle, not elsewhere classified: Secondary | ICD-10-CM | POA: Diagnosis not present

## 2016-06-18 DIAGNOSIS — M25661 Stiffness of right knee, not elsewhere classified: Secondary | ICD-10-CM | POA: Diagnosis not present

## 2016-06-18 DIAGNOSIS — Z96651 Presence of right artificial knee joint: Secondary | ICD-10-CM | POA: Diagnosis not present

## 2016-06-22 DIAGNOSIS — Z471 Aftercare following joint replacement surgery: Secondary | ICD-10-CM | POA: Diagnosis not present

## 2016-06-22 DIAGNOSIS — Z96651 Presence of right artificial knee joint: Secondary | ICD-10-CM | POA: Diagnosis not present

## 2016-06-22 DIAGNOSIS — M25672 Stiffness of left ankle, not elsewhere classified: Secondary | ICD-10-CM | POA: Diagnosis not present

## 2016-06-22 DIAGNOSIS — M25661 Stiffness of right knee, not elsewhere classified: Secondary | ICD-10-CM | POA: Diagnosis not present

## 2016-06-22 DIAGNOSIS — M6281 Muscle weakness (generalized): Secondary | ICD-10-CM | POA: Diagnosis not present

## 2016-06-22 DIAGNOSIS — M25561 Pain in right knee: Secondary | ICD-10-CM | POA: Diagnosis not present

## 2016-06-22 DIAGNOSIS — R2689 Other abnormalities of gait and mobility: Secondary | ICD-10-CM | POA: Diagnosis not present

## 2016-06-22 DIAGNOSIS — R600 Localized edema: Secondary | ICD-10-CM | POA: Diagnosis not present

## 2016-06-22 DIAGNOSIS — M25671 Stiffness of right ankle, not elsewhere classified: Secondary | ICD-10-CM | POA: Diagnosis not present

## 2016-06-24 DIAGNOSIS — Z471 Aftercare following joint replacement surgery: Secondary | ICD-10-CM | POA: Diagnosis not present

## 2016-06-24 DIAGNOSIS — M6281 Muscle weakness (generalized): Secondary | ICD-10-CM | POA: Diagnosis not present

## 2016-06-24 DIAGNOSIS — M25671 Stiffness of right ankle, not elsewhere classified: Secondary | ICD-10-CM | POA: Diagnosis not present

## 2016-06-24 DIAGNOSIS — M25561 Pain in right knee: Secondary | ICD-10-CM | POA: Diagnosis not present

## 2016-06-24 DIAGNOSIS — M25672 Stiffness of left ankle, not elsewhere classified: Secondary | ICD-10-CM | POA: Diagnosis not present

## 2016-06-24 DIAGNOSIS — R2689 Other abnormalities of gait and mobility: Secondary | ICD-10-CM | POA: Diagnosis not present

## 2016-06-24 DIAGNOSIS — Z96651 Presence of right artificial knee joint: Secondary | ICD-10-CM | POA: Diagnosis not present

## 2016-06-24 DIAGNOSIS — M25661 Stiffness of right knee, not elsewhere classified: Secondary | ICD-10-CM | POA: Diagnosis not present

## 2016-06-24 DIAGNOSIS — R600 Localized edema: Secondary | ICD-10-CM | POA: Diagnosis not present

## 2016-06-29 DIAGNOSIS — Z471 Aftercare following joint replacement surgery: Secondary | ICD-10-CM | POA: Diagnosis not present

## 2016-06-29 DIAGNOSIS — M25561 Pain in right knee: Secondary | ICD-10-CM | POA: Diagnosis not present

## 2016-06-29 DIAGNOSIS — M25671 Stiffness of right ankle, not elsewhere classified: Secondary | ICD-10-CM | POA: Diagnosis not present

## 2016-06-29 DIAGNOSIS — M25661 Stiffness of right knee, not elsewhere classified: Secondary | ICD-10-CM | POA: Diagnosis not present

## 2016-06-29 DIAGNOSIS — R2689 Other abnormalities of gait and mobility: Secondary | ICD-10-CM | POA: Diagnosis not present

## 2016-06-29 DIAGNOSIS — Z96651 Presence of right artificial knee joint: Secondary | ICD-10-CM | POA: Diagnosis not present

## 2016-06-29 DIAGNOSIS — M6281 Muscle weakness (generalized): Secondary | ICD-10-CM | POA: Diagnosis not present

## 2016-06-29 DIAGNOSIS — R600 Localized edema: Secondary | ICD-10-CM | POA: Diagnosis not present

## 2016-06-29 DIAGNOSIS — M25672 Stiffness of left ankle, not elsewhere classified: Secondary | ICD-10-CM | POA: Diagnosis not present

## 2016-07-01 DIAGNOSIS — M25672 Stiffness of left ankle, not elsewhere classified: Secondary | ICD-10-CM | POA: Diagnosis not present

## 2016-07-01 DIAGNOSIS — R600 Localized edema: Secondary | ICD-10-CM | POA: Diagnosis not present

## 2016-07-01 DIAGNOSIS — R2689 Other abnormalities of gait and mobility: Secondary | ICD-10-CM | POA: Diagnosis not present

## 2016-07-01 DIAGNOSIS — M25561 Pain in right knee: Secondary | ICD-10-CM | POA: Diagnosis not present

## 2016-07-01 DIAGNOSIS — Z471 Aftercare following joint replacement surgery: Secondary | ICD-10-CM | POA: Diagnosis not present

## 2016-07-01 DIAGNOSIS — M6281 Muscle weakness (generalized): Secondary | ICD-10-CM | POA: Diagnosis not present

## 2016-07-01 DIAGNOSIS — M25661 Stiffness of right knee, not elsewhere classified: Secondary | ICD-10-CM | POA: Diagnosis not present

## 2016-07-01 DIAGNOSIS — M25671 Stiffness of right ankle, not elsewhere classified: Secondary | ICD-10-CM | POA: Diagnosis not present

## 2016-07-01 DIAGNOSIS — Z96651 Presence of right artificial knee joint: Secondary | ICD-10-CM | POA: Diagnosis not present

## 2016-07-06 DIAGNOSIS — Z471 Aftercare following joint replacement surgery: Secondary | ICD-10-CM | POA: Diagnosis not present

## 2016-07-06 DIAGNOSIS — Z96651 Presence of right artificial knee joint: Secondary | ICD-10-CM | POA: Diagnosis not present

## 2016-07-06 DIAGNOSIS — M25561 Pain in right knee: Secondary | ICD-10-CM | POA: Diagnosis not present

## 2016-07-06 DIAGNOSIS — M25671 Stiffness of right ankle, not elsewhere classified: Secondary | ICD-10-CM | POA: Diagnosis not present

## 2016-07-06 DIAGNOSIS — M25661 Stiffness of right knee, not elsewhere classified: Secondary | ICD-10-CM | POA: Diagnosis not present

## 2016-07-06 DIAGNOSIS — R2689 Other abnormalities of gait and mobility: Secondary | ICD-10-CM | POA: Diagnosis not present

## 2016-07-06 DIAGNOSIS — R600 Localized edema: Secondary | ICD-10-CM | POA: Diagnosis not present

## 2016-07-06 DIAGNOSIS — M6281 Muscle weakness (generalized): Secondary | ICD-10-CM | POA: Diagnosis not present

## 2016-07-06 DIAGNOSIS — M25672 Stiffness of left ankle, not elsewhere classified: Secondary | ICD-10-CM | POA: Diagnosis not present

## 2016-07-09 DIAGNOSIS — R2689 Other abnormalities of gait and mobility: Secondary | ICD-10-CM | POA: Diagnosis not present

## 2016-07-09 DIAGNOSIS — Z96651 Presence of right artificial knee joint: Secondary | ICD-10-CM | POA: Diagnosis not present

## 2016-07-09 DIAGNOSIS — M25672 Stiffness of left ankle, not elsewhere classified: Secondary | ICD-10-CM | POA: Diagnosis not present

## 2016-07-09 DIAGNOSIS — R600 Localized edema: Secondary | ICD-10-CM | POA: Diagnosis not present

## 2016-07-09 DIAGNOSIS — M6281 Muscle weakness (generalized): Secondary | ICD-10-CM | POA: Diagnosis not present

## 2016-07-09 DIAGNOSIS — M25561 Pain in right knee: Secondary | ICD-10-CM | POA: Diagnosis not present

## 2016-07-09 DIAGNOSIS — M25661 Stiffness of right knee, not elsewhere classified: Secondary | ICD-10-CM | POA: Diagnosis not present

## 2016-07-09 DIAGNOSIS — Z471 Aftercare following joint replacement surgery: Secondary | ICD-10-CM | POA: Diagnosis not present

## 2016-07-09 DIAGNOSIS — M25671 Stiffness of right ankle, not elsewhere classified: Secondary | ICD-10-CM | POA: Diagnosis not present

## 2016-07-16 DIAGNOSIS — M6281 Muscle weakness (generalized): Secondary | ICD-10-CM | POA: Diagnosis not present

## 2016-07-16 DIAGNOSIS — Z96651 Presence of right artificial knee joint: Secondary | ICD-10-CM | POA: Diagnosis not present

## 2016-07-16 DIAGNOSIS — M25661 Stiffness of right knee, not elsewhere classified: Secondary | ICD-10-CM | POA: Diagnosis not present

## 2016-07-16 DIAGNOSIS — M25671 Stiffness of right ankle, not elsewhere classified: Secondary | ICD-10-CM | POA: Diagnosis not present

## 2016-07-16 DIAGNOSIS — Z471 Aftercare following joint replacement surgery: Secondary | ICD-10-CM | POA: Diagnosis not present

## 2016-07-16 DIAGNOSIS — R600 Localized edema: Secondary | ICD-10-CM | POA: Diagnosis not present

## 2016-07-16 DIAGNOSIS — M25672 Stiffness of left ankle, not elsewhere classified: Secondary | ICD-10-CM | POA: Diagnosis not present

## 2016-07-16 DIAGNOSIS — M25561 Pain in right knee: Secondary | ICD-10-CM | POA: Diagnosis not present

## 2016-07-16 DIAGNOSIS — R2689 Other abnormalities of gait and mobility: Secondary | ICD-10-CM | POA: Diagnosis not present

## 2016-07-21 DIAGNOSIS — Z471 Aftercare following joint replacement surgery: Secondary | ICD-10-CM | POA: Diagnosis not present

## 2016-07-21 DIAGNOSIS — R600 Localized edema: Secondary | ICD-10-CM | POA: Diagnosis not present

## 2016-07-21 DIAGNOSIS — R2689 Other abnormalities of gait and mobility: Secondary | ICD-10-CM | POA: Diagnosis not present

## 2016-07-21 DIAGNOSIS — M25671 Stiffness of right ankle, not elsewhere classified: Secondary | ICD-10-CM | POA: Diagnosis not present

## 2016-07-21 DIAGNOSIS — M25661 Stiffness of right knee, not elsewhere classified: Secondary | ICD-10-CM | POA: Diagnosis not present

## 2016-07-21 DIAGNOSIS — M6281 Muscle weakness (generalized): Secondary | ICD-10-CM | POA: Diagnosis not present

## 2016-07-21 DIAGNOSIS — M25561 Pain in right knee: Secondary | ICD-10-CM | POA: Diagnosis not present

## 2016-07-21 DIAGNOSIS — Z96651 Presence of right artificial knee joint: Secondary | ICD-10-CM | POA: Diagnosis not present

## 2016-07-21 DIAGNOSIS — M25672 Stiffness of left ankle, not elsewhere classified: Secondary | ICD-10-CM | POA: Diagnosis not present

## 2016-09-02 DIAGNOSIS — Z96651 Presence of right artificial knee joint: Secondary | ICD-10-CM | POA: Diagnosis not present

## 2016-09-02 DIAGNOSIS — Z471 Aftercare following joint replacement surgery: Secondary | ICD-10-CM | POA: Diagnosis not present

## 2016-10-07 DIAGNOSIS — K648 Other hemorrhoids: Secondary | ICD-10-CM | POA: Diagnosis not present

## 2016-10-25 DIAGNOSIS — Z79899 Other long term (current) drug therapy: Secondary | ICD-10-CM | POA: Diagnosis not present

## 2016-10-25 DIAGNOSIS — E785 Hyperlipidemia, unspecified: Secondary | ICD-10-CM | POA: Diagnosis not present

## 2016-10-26 DIAGNOSIS — M130 Polyarthritis, unspecified: Secondary | ICD-10-CM | POA: Diagnosis not present

## 2016-10-26 DIAGNOSIS — Z0000 Encounter for general adult medical examination without abnormal findings: Secondary | ICD-10-CM | POA: Diagnosis not present

## 2016-10-26 DIAGNOSIS — E039 Hypothyroidism, unspecified: Secondary | ICD-10-CM | POA: Diagnosis not present

## 2016-10-26 DIAGNOSIS — Z131 Encounter for screening for diabetes mellitus: Secondary | ICD-10-CM | POA: Diagnosis not present

## 2016-10-26 DIAGNOSIS — M859 Disorder of bone density and structure, unspecified: Secondary | ICD-10-CM | POA: Diagnosis not present

## 2016-11-05 DIAGNOSIS — I10 Essential (primary) hypertension: Secondary | ICD-10-CM | POA: Diagnosis not present

## 2016-11-05 DIAGNOSIS — Z79899 Other long term (current) drug therapy: Secondary | ICD-10-CM | POA: Diagnosis not present

## 2016-11-05 DIAGNOSIS — D122 Benign neoplasm of ascending colon: Secondary | ICD-10-CM | POA: Diagnosis not present

## 2016-11-05 DIAGNOSIS — K648 Other hemorrhoids: Secondary | ICD-10-CM | POA: Diagnosis not present

## 2016-11-05 DIAGNOSIS — Z86010 Personal history of colonic polyps: Secondary | ICD-10-CM | POA: Diagnosis not present

## 2016-11-05 DIAGNOSIS — Z7982 Long term (current) use of aspirin: Secondary | ICD-10-CM | POA: Diagnosis not present

## 2017-01-26 DIAGNOSIS — L0291 Cutaneous abscess, unspecified: Secondary | ICD-10-CM | POA: Diagnosis not present

## 2017-01-26 DIAGNOSIS — W57XXXA Bitten or stung by nonvenomous insect and other nonvenomous arthropods, initial encounter: Secondary | ICD-10-CM | POA: Diagnosis not present

## 2017-01-26 DIAGNOSIS — S80869A Insect bite (nonvenomous), unspecified lower leg, initial encounter: Secondary | ICD-10-CM | POA: Diagnosis not present

## 2017-02-01 DIAGNOSIS — M791 Myalgia: Secondary | ICD-10-CM | POA: Diagnosis not present

## 2017-02-01 DIAGNOSIS — M150 Primary generalized (osteo)arthritis: Secondary | ICD-10-CM | POA: Diagnosis not present

## 2017-03-01 DIAGNOSIS — H40002 Preglaucoma, unspecified, left eye: Secondary | ICD-10-CM | POA: Diagnosis not present

## 2017-03-01 DIAGNOSIS — H5212 Myopia, left eye: Secondary | ICD-10-CM | POA: Diagnosis not present

## 2017-03-01 DIAGNOSIS — H26492 Other secondary cataract, left eye: Secondary | ICD-10-CM | POA: Diagnosis not present

## 2017-03-01 DIAGNOSIS — H53452 Other localized visual field defect, left eye: Secondary | ICD-10-CM | POA: Diagnosis not present

## 2017-03-15 DIAGNOSIS — H26491 Other secondary cataract, right eye: Secondary | ICD-10-CM | POA: Diagnosis not present

## 2017-03-15 DIAGNOSIS — H401131 Primary open-angle glaucoma, bilateral, mild stage: Secondary | ICD-10-CM | POA: Diagnosis not present

## 2017-03-15 DIAGNOSIS — Z961 Presence of intraocular lens: Secondary | ICD-10-CM | POA: Diagnosis not present

## 2017-03-15 DIAGNOSIS — H26492 Other secondary cataract, left eye: Secondary | ICD-10-CM | POA: Diagnosis not present

## 2017-03-15 DIAGNOSIS — H02839 Dermatochalasis of unspecified eye, unspecified eyelid: Secondary | ICD-10-CM | POA: Diagnosis not present

## 2017-03-23 DIAGNOSIS — R232 Flushing: Secondary | ICD-10-CM | POA: Diagnosis not present

## 2017-03-23 DIAGNOSIS — H26492 Other secondary cataract, left eye: Secondary | ICD-10-CM | POA: Diagnosis not present

## 2017-04-04 DIAGNOSIS — M150 Primary generalized (osteo)arthritis: Secondary | ICD-10-CM | POA: Diagnosis not present

## 2017-04-04 DIAGNOSIS — M791 Myalgia: Secondary | ICD-10-CM | POA: Diagnosis not present

## 2017-04-25 DIAGNOSIS — Z7952 Long term (current) use of systemic steroids: Secondary | ICD-10-CM | POA: Diagnosis not present

## 2017-04-25 DIAGNOSIS — M791 Myalgia, unspecified site: Secondary | ICD-10-CM | POA: Diagnosis not present

## 2017-04-25 DIAGNOSIS — M150 Primary generalized (osteo)arthritis: Secondary | ICD-10-CM | POA: Diagnosis not present

## 2017-04-27 DIAGNOSIS — H40002 Preglaucoma, unspecified, left eye: Secondary | ICD-10-CM | POA: Diagnosis not present

## 2017-06-13 DIAGNOSIS — N951 Menopausal and female climacteric states: Secondary | ICD-10-CM | POA: Diagnosis not present

## 2017-06-13 DIAGNOSIS — E7800 Pure hypercholesterolemia, unspecified: Secondary | ICD-10-CM | POA: Diagnosis not present

## 2017-06-13 DIAGNOSIS — J309 Allergic rhinitis, unspecified: Secondary | ICD-10-CM | POA: Diagnosis not present

## 2017-06-13 DIAGNOSIS — E039 Hypothyroidism, unspecified: Secondary | ICD-10-CM | POA: Diagnosis not present

## 2017-06-13 DIAGNOSIS — K219 Gastro-esophageal reflux disease without esophagitis: Secondary | ICD-10-CM | POA: Diagnosis not present

## 2017-06-14 DIAGNOSIS — E785 Hyperlipidemia, unspecified: Secondary | ICD-10-CM | POA: Diagnosis not present

## 2017-06-14 DIAGNOSIS — R7309 Other abnormal glucose: Secondary | ICD-10-CM | POA: Diagnosis not present

## 2017-06-14 DIAGNOSIS — Z79899 Other long term (current) drug therapy: Secondary | ICD-10-CM | POA: Diagnosis not present

## 2017-06-14 DIAGNOSIS — E039 Hypothyroidism, unspecified: Secondary | ICD-10-CM | POA: Diagnosis not present

## 2017-07-26 DIAGNOSIS — Z7952 Long term (current) use of systemic steroids: Secondary | ICD-10-CM | POA: Diagnosis not present

## 2017-07-26 DIAGNOSIS — M150 Primary generalized (osteo)arthritis: Secondary | ICD-10-CM | POA: Diagnosis not present

## 2017-07-26 DIAGNOSIS — M353 Polymyalgia rheumatica: Secondary | ICD-10-CM | POA: Diagnosis not present

## 2017-08-16 DIAGNOSIS — J209 Acute bronchitis, unspecified: Secondary | ICD-10-CM | POA: Diagnosis not present

## 2017-09-20 DIAGNOSIS — M1712 Unilateral primary osteoarthritis, left knee: Secondary | ICD-10-CM | POA: Diagnosis not present

## 2017-09-20 DIAGNOSIS — M170 Bilateral primary osteoarthritis of knee: Secondary | ICD-10-CM | POA: Diagnosis not present

## 2017-09-20 DIAGNOSIS — M1711 Unilateral primary osteoarthritis, right knee: Secondary | ICD-10-CM | POA: Diagnosis not present

## 2017-09-20 HISTORY — DX: Unilateral primary osteoarthritis, left knee: M17.12

## 2017-09-23 DIAGNOSIS — R2689 Other abnormalities of gait and mobility: Secondary | ICD-10-CM | POA: Diagnosis not present

## 2017-09-23 DIAGNOSIS — M6281 Muscle weakness (generalized): Secondary | ICD-10-CM | POA: Diagnosis not present

## 2017-09-23 DIAGNOSIS — M25561 Pain in right knee: Secondary | ICD-10-CM | POA: Diagnosis not present

## 2017-09-23 DIAGNOSIS — M25662 Stiffness of left knee, not elsewhere classified: Secondary | ICD-10-CM | POA: Diagnosis not present

## 2017-09-23 DIAGNOSIS — M79651 Pain in right thigh: Secondary | ICD-10-CM | POA: Diagnosis not present

## 2017-09-23 DIAGNOSIS — M79652 Pain in left thigh: Secondary | ICD-10-CM | POA: Diagnosis not present

## 2017-09-23 DIAGNOSIS — R293 Abnormal posture: Secondary | ICD-10-CM | POA: Diagnosis not present

## 2017-09-23 DIAGNOSIS — M1711 Unilateral primary osteoarthritis, right knee: Secondary | ICD-10-CM | POA: Diagnosis not present

## 2017-09-23 DIAGNOSIS — M25661 Stiffness of right knee, not elsewhere classified: Secondary | ICD-10-CM | POA: Diagnosis not present

## 2017-09-23 DIAGNOSIS — M25562 Pain in left knee: Secondary | ICD-10-CM | POA: Diagnosis not present

## 2017-09-28 DIAGNOSIS — R293 Abnormal posture: Secondary | ICD-10-CM | POA: Diagnosis not present

## 2017-09-28 DIAGNOSIS — M25561 Pain in right knee: Secondary | ICD-10-CM | POA: Diagnosis not present

## 2017-09-28 DIAGNOSIS — M25662 Stiffness of left knee, not elsewhere classified: Secondary | ICD-10-CM | POA: Diagnosis not present

## 2017-09-28 DIAGNOSIS — M1711 Unilateral primary osteoarthritis, right knee: Secondary | ICD-10-CM | POA: Diagnosis not present

## 2017-09-28 DIAGNOSIS — M25562 Pain in left knee: Secondary | ICD-10-CM | POA: Diagnosis not present

## 2017-09-28 DIAGNOSIS — R2689 Other abnormalities of gait and mobility: Secondary | ICD-10-CM | POA: Diagnosis not present

## 2017-09-28 DIAGNOSIS — M79651 Pain in right thigh: Secondary | ICD-10-CM | POA: Diagnosis not present

## 2017-09-28 DIAGNOSIS — M79652 Pain in left thigh: Secondary | ICD-10-CM | POA: Diagnosis not present

## 2017-09-28 DIAGNOSIS — M25661 Stiffness of right knee, not elsewhere classified: Secondary | ICD-10-CM | POA: Diagnosis not present

## 2017-09-28 DIAGNOSIS — M6281 Muscle weakness (generalized): Secondary | ICD-10-CM | POA: Diagnosis not present

## 2017-09-29 DIAGNOSIS — M1711 Unilateral primary osteoarthritis, right knee: Secondary | ICD-10-CM | POA: Diagnosis not present

## 2017-09-29 DIAGNOSIS — M25562 Pain in left knee: Secondary | ICD-10-CM | POA: Diagnosis not present

## 2017-09-29 DIAGNOSIS — M25561 Pain in right knee: Secondary | ICD-10-CM | POA: Diagnosis not present

## 2017-09-29 DIAGNOSIS — M25662 Stiffness of left knee, not elsewhere classified: Secondary | ICD-10-CM | POA: Diagnosis not present

## 2017-09-29 DIAGNOSIS — M79651 Pain in right thigh: Secondary | ICD-10-CM | POA: Diagnosis not present

## 2017-09-29 DIAGNOSIS — M79652 Pain in left thigh: Secondary | ICD-10-CM | POA: Diagnosis not present

## 2017-09-29 DIAGNOSIS — R2689 Other abnormalities of gait and mobility: Secondary | ICD-10-CM | POA: Diagnosis not present

## 2017-09-29 DIAGNOSIS — M6281 Muscle weakness (generalized): Secondary | ICD-10-CM | POA: Diagnosis not present

## 2017-09-29 DIAGNOSIS — R293 Abnormal posture: Secondary | ICD-10-CM | POA: Diagnosis not present

## 2017-09-29 DIAGNOSIS — M25661 Stiffness of right knee, not elsewhere classified: Secondary | ICD-10-CM | POA: Diagnosis not present

## 2017-10-03 DIAGNOSIS — R2689 Other abnormalities of gait and mobility: Secondary | ICD-10-CM | POA: Diagnosis not present

## 2017-10-03 DIAGNOSIS — M25561 Pain in right knee: Secondary | ICD-10-CM | POA: Diagnosis not present

## 2017-10-03 DIAGNOSIS — M25562 Pain in left knee: Secondary | ICD-10-CM | POA: Diagnosis not present

## 2017-10-03 DIAGNOSIS — M25662 Stiffness of left knee, not elsewhere classified: Secondary | ICD-10-CM | POA: Diagnosis not present

## 2017-10-03 DIAGNOSIS — M79651 Pain in right thigh: Secondary | ICD-10-CM | POA: Diagnosis not present

## 2017-10-03 DIAGNOSIS — M6281 Muscle weakness (generalized): Secondary | ICD-10-CM | POA: Diagnosis not present

## 2017-10-03 DIAGNOSIS — M79652 Pain in left thigh: Secondary | ICD-10-CM | POA: Diagnosis not present

## 2017-10-03 DIAGNOSIS — M1711 Unilateral primary osteoarthritis, right knee: Secondary | ICD-10-CM | POA: Diagnosis not present

## 2017-10-03 DIAGNOSIS — M25661 Stiffness of right knee, not elsewhere classified: Secondary | ICD-10-CM | POA: Diagnosis not present

## 2017-10-03 DIAGNOSIS — R293 Abnormal posture: Secondary | ICD-10-CM | POA: Diagnosis not present

## 2017-10-04 DIAGNOSIS — M1712 Unilateral primary osteoarthritis, left knee: Secondary | ICD-10-CM | POA: Diagnosis not present

## 2017-10-06 DIAGNOSIS — M25661 Stiffness of right knee, not elsewhere classified: Secondary | ICD-10-CM | POA: Diagnosis not present

## 2017-10-06 DIAGNOSIS — M79651 Pain in right thigh: Secondary | ICD-10-CM | POA: Diagnosis not present

## 2017-10-06 DIAGNOSIS — M25561 Pain in right knee: Secondary | ICD-10-CM | POA: Diagnosis not present

## 2017-10-06 DIAGNOSIS — M6281 Muscle weakness (generalized): Secondary | ICD-10-CM | POA: Diagnosis not present

## 2017-10-06 DIAGNOSIS — M1711 Unilateral primary osteoarthritis, right knee: Secondary | ICD-10-CM | POA: Diagnosis not present

## 2017-10-06 DIAGNOSIS — M25562 Pain in left knee: Secondary | ICD-10-CM | POA: Diagnosis not present

## 2017-10-06 DIAGNOSIS — M25662 Stiffness of left knee, not elsewhere classified: Secondary | ICD-10-CM | POA: Diagnosis not present

## 2017-10-06 DIAGNOSIS — R2689 Other abnormalities of gait and mobility: Secondary | ICD-10-CM | POA: Diagnosis not present

## 2017-10-06 DIAGNOSIS — M79652 Pain in left thigh: Secondary | ICD-10-CM | POA: Diagnosis not present

## 2017-10-06 DIAGNOSIS — R293 Abnormal posture: Secondary | ICD-10-CM | POA: Diagnosis not present

## 2017-10-11 DIAGNOSIS — R293 Abnormal posture: Secondary | ICD-10-CM | POA: Diagnosis not present

## 2017-10-11 DIAGNOSIS — M79652 Pain in left thigh: Secondary | ICD-10-CM | POA: Diagnosis not present

## 2017-10-11 DIAGNOSIS — M79651 Pain in right thigh: Secondary | ICD-10-CM | POA: Diagnosis not present

## 2017-10-11 DIAGNOSIS — M1711 Unilateral primary osteoarthritis, right knee: Secondary | ICD-10-CM | POA: Diagnosis not present

## 2017-10-11 DIAGNOSIS — M25662 Stiffness of left knee, not elsewhere classified: Secondary | ICD-10-CM | POA: Diagnosis not present

## 2017-10-11 DIAGNOSIS — R2689 Other abnormalities of gait and mobility: Secondary | ICD-10-CM | POA: Diagnosis not present

## 2017-10-11 DIAGNOSIS — M25661 Stiffness of right knee, not elsewhere classified: Secondary | ICD-10-CM | POA: Diagnosis not present

## 2017-10-11 DIAGNOSIS — M6281 Muscle weakness (generalized): Secondary | ICD-10-CM | POA: Diagnosis not present

## 2017-10-11 DIAGNOSIS — M25561 Pain in right knee: Secondary | ICD-10-CM | POA: Diagnosis not present

## 2017-10-11 DIAGNOSIS — M25562 Pain in left knee: Secondary | ICD-10-CM | POA: Diagnosis not present

## 2017-10-14 DIAGNOSIS — R2689 Other abnormalities of gait and mobility: Secondary | ICD-10-CM | POA: Diagnosis not present

## 2017-10-14 DIAGNOSIS — M25561 Pain in right knee: Secondary | ICD-10-CM | POA: Diagnosis not present

## 2017-10-14 DIAGNOSIS — M25562 Pain in left knee: Secondary | ICD-10-CM | POA: Diagnosis not present

## 2017-10-14 DIAGNOSIS — M25662 Stiffness of left knee, not elsewhere classified: Secondary | ICD-10-CM | POA: Diagnosis not present

## 2017-10-14 DIAGNOSIS — M79651 Pain in right thigh: Secondary | ICD-10-CM | POA: Diagnosis not present

## 2017-10-14 DIAGNOSIS — M6281 Muscle weakness (generalized): Secondary | ICD-10-CM | POA: Diagnosis not present

## 2017-10-14 DIAGNOSIS — M25661 Stiffness of right knee, not elsewhere classified: Secondary | ICD-10-CM | POA: Diagnosis not present

## 2017-10-14 DIAGNOSIS — R293 Abnormal posture: Secondary | ICD-10-CM | POA: Diagnosis not present

## 2017-10-14 DIAGNOSIS — M79652 Pain in left thigh: Secondary | ICD-10-CM | POA: Diagnosis not present

## 2017-10-14 DIAGNOSIS — M1711 Unilateral primary osteoarthritis, right knee: Secondary | ICD-10-CM | POA: Diagnosis not present

## 2017-10-18 DIAGNOSIS — M1711 Unilateral primary osteoarthritis, right knee: Secondary | ICD-10-CM | POA: Diagnosis not present

## 2017-10-18 DIAGNOSIS — M25562 Pain in left knee: Secondary | ICD-10-CM | POA: Diagnosis not present

## 2017-10-18 DIAGNOSIS — M79651 Pain in right thigh: Secondary | ICD-10-CM | POA: Diagnosis not present

## 2017-10-18 DIAGNOSIS — M79652 Pain in left thigh: Secondary | ICD-10-CM | POA: Diagnosis not present

## 2017-10-18 DIAGNOSIS — M25662 Stiffness of left knee, not elsewhere classified: Secondary | ICD-10-CM | POA: Diagnosis not present

## 2017-10-18 DIAGNOSIS — M25561 Pain in right knee: Secondary | ICD-10-CM | POA: Diagnosis not present

## 2017-10-18 DIAGNOSIS — M25661 Stiffness of right knee, not elsewhere classified: Secondary | ICD-10-CM | POA: Diagnosis not present

## 2017-10-18 DIAGNOSIS — R293 Abnormal posture: Secondary | ICD-10-CM | POA: Diagnosis not present

## 2017-10-18 DIAGNOSIS — M6281 Muscle weakness (generalized): Secondary | ICD-10-CM | POA: Diagnosis not present

## 2017-10-18 DIAGNOSIS — R2689 Other abnormalities of gait and mobility: Secondary | ICD-10-CM | POA: Diagnosis not present

## 2017-10-20 DIAGNOSIS — M1711 Unilateral primary osteoarthritis, right knee: Secondary | ICD-10-CM | POA: Diagnosis not present

## 2017-10-20 DIAGNOSIS — M79652 Pain in left thigh: Secondary | ICD-10-CM | POA: Diagnosis not present

## 2017-10-20 DIAGNOSIS — M79651 Pain in right thigh: Secondary | ICD-10-CM | POA: Diagnosis not present

## 2017-10-20 DIAGNOSIS — R293 Abnormal posture: Secondary | ICD-10-CM | POA: Diagnosis not present

## 2017-10-20 DIAGNOSIS — M25561 Pain in right knee: Secondary | ICD-10-CM | POA: Diagnosis not present

## 2017-10-20 DIAGNOSIS — M6281 Muscle weakness (generalized): Secondary | ICD-10-CM | POA: Diagnosis not present

## 2017-10-20 DIAGNOSIS — M25662 Stiffness of left knee, not elsewhere classified: Secondary | ICD-10-CM | POA: Diagnosis not present

## 2017-10-20 DIAGNOSIS — R2689 Other abnormalities of gait and mobility: Secondary | ICD-10-CM | POA: Diagnosis not present

## 2017-10-20 DIAGNOSIS — M25562 Pain in left knee: Secondary | ICD-10-CM | POA: Diagnosis not present

## 2017-10-20 DIAGNOSIS — M25661 Stiffness of right knee, not elsewhere classified: Secondary | ICD-10-CM | POA: Diagnosis not present

## 2017-10-24 DIAGNOSIS — Z7952 Long term (current) use of systemic steroids: Secondary | ICD-10-CM | POA: Diagnosis not present

## 2017-10-24 DIAGNOSIS — M353 Polymyalgia rheumatica: Secondary | ICD-10-CM | POA: Diagnosis not present

## 2017-10-24 DIAGNOSIS — M150 Primary generalized (osteo)arthritis: Secondary | ICD-10-CM | POA: Diagnosis not present

## 2017-10-25 DIAGNOSIS — M79651 Pain in right thigh: Secondary | ICD-10-CM | POA: Diagnosis not present

## 2017-10-25 DIAGNOSIS — R293 Abnormal posture: Secondary | ICD-10-CM | POA: Diagnosis not present

## 2017-10-25 DIAGNOSIS — R2689 Other abnormalities of gait and mobility: Secondary | ICD-10-CM | POA: Diagnosis not present

## 2017-10-25 DIAGNOSIS — M25661 Stiffness of right knee, not elsewhere classified: Secondary | ICD-10-CM | POA: Diagnosis not present

## 2017-10-25 DIAGNOSIS — M6281 Muscle weakness (generalized): Secondary | ICD-10-CM | POA: Diagnosis not present

## 2017-10-25 DIAGNOSIS — M25562 Pain in left knee: Secondary | ICD-10-CM | POA: Diagnosis not present

## 2017-10-25 DIAGNOSIS — M79652 Pain in left thigh: Secondary | ICD-10-CM | POA: Diagnosis not present

## 2017-10-25 DIAGNOSIS — M1711 Unilateral primary osteoarthritis, right knee: Secondary | ICD-10-CM | POA: Diagnosis not present

## 2017-10-25 DIAGNOSIS — M25662 Stiffness of left knee, not elsewhere classified: Secondary | ICD-10-CM | POA: Diagnosis not present

## 2017-10-25 DIAGNOSIS — M25561 Pain in right knee: Secondary | ICD-10-CM | POA: Diagnosis not present

## 2017-10-26 DIAGNOSIS — H40002 Preglaucoma, unspecified, left eye: Secondary | ICD-10-CM | POA: Diagnosis not present

## 2017-10-27 DIAGNOSIS — M25661 Stiffness of right knee, not elsewhere classified: Secondary | ICD-10-CM | POA: Diagnosis not present

## 2017-10-27 DIAGNOSIS — M6281 Muscle weakness (generalized): Secondary | ICD-10-CM | POA: Diagnosis not present

## 2017-10-27 DIAGNOSIS — M25561 Pain in right knee: Secondary | ICD-10-CM | POA: Diagnosis not present

## 2017-10-27 DIAGNOSIS — M79651 Pain in right thigh: Secondary | ICD-10-CM | POA: Diagnosis not present

## 2017-10-27 DIAGNOSIS — R2689 Other abnormalities of gait and mobility: Secondary | ICD-10-CM | POA: Diagnosis not present

## 2017-10-27 DIAGNOSIS — M1711 Unilateral primary osteoarthritis, right knee: Secondary | ICD-10-CM | POA: Diagnosis not present

## 2017-10-27 DIAGNOSIS — M25562 Pain in left knee: Secondary | ICD-10-CM | POA: Diagnosis not present

## 2017-10-27 DIAGNOSIS — M79652 Pain in left thigh: Secondary | ICD-10-CM | POA: Diagnosis not present

## 2017-10-27 DIAGNOSIS — R293 Abnormal posture: Secondary | ICD-10-CM | POA: Diagnosis not present

## 2017-10-27 DIAGNOSIS — M25662 Stiffness of left knee, not elsewhere classified: Secondary | ICD-10-CM | POA: Diagnosis not present

## 2017-11-01 DIAGNOSIS — M6281 Muscle weakness (generalized): Secondary | ICD-10-CM | POA: Diagnosis not present

## 2017-11-01 DIAGNOSIS — R2689 Other abnormalities of gait and mobility: Secondary | ICD-10-CM | POA: Diagnosis not present

## 2017-11-01 DIAGNOSIS — M25562 Pain in left knee: Secondary | ICD-10-CM | POA: Diagnosis not present

## 2017-11-01 DIAGNOSIS — M79651 Pain in right thigh: Secondary | ICD-10-CM | POA: Diagnosis not present

## 2017-11-01 DIAGNOSIS — R293 Abnormal posture: Secondary | ICD-10-CM | POA: Diagnosis not present

## 2017-11-01 DIAGNOSIS — M25662 Stiffness of left knee, not elsewhere classified: Secondary | ICD-10-CM | POA: Diagnosis not present

## 2017-11-01 DIAGNOSIS — M1711 Unilateral primary osteoarthritis, right knee: Secondary | ICD-10-CM | POA: Diagnosis not present

## 2017-11-01 DIAGNOSIS — M25661 Stiffness of right knee, not elsewhere classified: Secondary | ICD-10-CM | POA: Diagnosis not present

## 2017-11-01 DIAGNOSIS — M25561 Pain in right knee: Secondary | ICD-10-CM | POA: Diagnosis not present

## 2017-11-01 DIAGNOSIS — M79652 Pain in left thigh: Secondary | ICD-10-CM | POA: Diagnosis not present

## 2017-11-03 DIAGNOSIS — M25561 Pain in right knee: Secondary | ICD-10-CM | POA: Diagnosis not present

## 2017-11-03 DIAGNOSIS — M6281 Muscle weakness (generalized): Secondary | ICD-10-CM | POA: Diagnosis not present

## 2017-11-03 DIAGNOSIS — M25562 Pain in left knee: Secondary | ICD-10-CM | POA: Diagnosis not present

## 2017-11-03 DIAGNOSIS — M25662 Stiffness of left knee, not elsewhere classified: Secondary | ICD-10-CM | POA: Diagnosis not present

## 2017-11-03 DIAGNOSIS — R2689 Other abnormalities of gait and mobility: Secondary | ICD-10-CM | POA: Diagnosis not present

## 2017-11-03 DIAGNOSIS — R293 Abnormal posture: Secondary | ICD-10-CM | POA: Diagnosis not present

## 2017-11-03 DIAGNOSIS — M1711 Unilateral primary osteoarthritis, right knee: Secondary | ICD-10-CM | POA: Diagnosis not present

## 2017-11-03 DIAGNOSIS — M79651 Pain in right thigh: Secondary | ICD-10-CM | POA: Diagnosis not present

## 2017-11-03 DIAGNOSIS — M25661 Stiffness of right knee, not elsewhere classified: Secondary | ICD-10-CM | POA: Diagnosis not present

## 2017-11-03 DIAGNOSIS — M79652 Pain in left thigh: Secondary | ICD-10-CM | POA: Diagnosis not present

## 2017-11-09 DIAGNOSIS — R293 Abnormal posture: Secondary | ICD-10-CM | POA: Diagnosis not present

## 2017-11-09 DIAGNOSIS — M79652 Pain in left thigh: Secondary | ICD-10-CM | POA: Diagnosis not present

## 2017-11-09 DIAGNOSIS — M25562 Pain in left knee: Secondary | ICD-10-CM | POA: Diagnosis not present

## 2017-11-09 DIAGNOSIS — M25661 Stiffness of right knee, not elsewhere classified: Secondary | ICD-10-CM | POA: Diagnosis not present

## 2017-11-09 DIAGNOSIS — M25662 Stiffness of left knee, not elsewhere classified: Secondary | ICD-10-CM | POA: Diagnosis not present

## 2017-11-09 DIAGNOSIS — M6281 Muscle weakness (generalized): Secondary | ICD-10-CM | POA: Diagnosis not present

## 2017-11-09 DIAGNOSIS — M79651 Pain in right thigh: Secondary | ICD-10-CM | POA: Diagnosis not present

## 2017-11-09 DIAGNOSIS — R2689 Other abnormalities of gait and mobility: Secondary | ICD-10-CM | POA: Diagnosis not present

## 2017-11-09 DIAGNOSIS — M1711 Unilateral primary osteoarthritis, right knee: Secondary | ICD-10-CM | POA: Diagnosis not present

## 2017-11-09 DIAGNOSIS — M25561 Pain in right knee: Secondary | ICD-10-CM | POA: Diagnosis not present

## 2017-11-11 DIAGNOSIS — M79651 Pain in right thigh: Secondary | ICD-10-CM | POA: Diagnosis not present

## 2017-11-11 DIAGNOSIS — M6281 Muscle weakness (generalized): Secondary | ICD-10-CM | POA: Diagnosis not present

## 2017-11-11 DIAGNOSIS — M1711 Unilateral primary osteoarthritis, right knee: Secondary | ICD-10-CM | POA: Diagnosis not present

## 2017-11-11 DIAGNOSIS — M79652 Pain in left thigh: Secondary | ICD-10-CM | POA: Diagnosis not present

## 2017-11-11 DIAGNOSIS — R293 Abnormal posture: Secondary | ICD-10-CM | POA: Diagnosis not present

## 2017-11-11 DIAGNOSIS — M25561 Pain in right knee: Secondary | ICD-10-CM | POA: Diagnosis not present

## 2017-11-11 DIAGNOSIS — M25661 Stiffness of right knee, not elsewhere classified: Secondary | ICD-10-CM | POA: Diagnosis not present

## 2017-11-11 DIAGNOSIS — M25662 Stiffness of left knee, not elsewhere classified: Secondary | ICD-10-CM | POA: Diagnosis not present

## 2017-11-11 DIAGNOSIS — R2689 Other abnormalities of gait and mobility: Secondary | ICD-10-CM | POA: Diagnosis not present

## 2017-11-11 DIAGNOSIS — M25562 Pain in left knee: Secondary | ICD-10-CM | POA: Diagnosis not present

## 2017-11-16 DIAGNOSIS — M25561 Pain in right knee: Secondary | ICD-10-CM | POA: Diagnosis not present

## 2017-11-16 DIAGNOSIS — M6281 Muscle weakness (generalized): Secondary | ICD-10-CM | POA: Diagnosis not present

## 2017-11-16 DIAGNOSIS — R2689 Other abnormalities of gait and mobility: Secondary | ICD-10-CM | POA: Diagnosis not present

## 2017-11-16 DIAGNOSIS — M1711 Unilateral primary osteoarthritis, right knee: Secondary | ICD-10-CM | POA: Diagnosis not present

## 2017-11-16 DIAGNOSIS — M79651 Pain in right thigh: Secondary | ICD-10-CM | POA: Diagnosis not present

## 2017-11-16 DIAGNOSIS — M25562 Pain in left knee: Secondary | ICD-10-CM | POA: Diagnosis not present

## 2017-11-16 DIAGNOSIS — M79652 Pain in left thigh: Secondary | ICD-10-CM | POA: Diagnosis not present

## 2017-11-16 DIAGNOSIS — M25661 Stiffness of right knee, not elsewhere classified: Secondary | ICD-10-CM | POA: Diagnosis not present

## 2017-11-16 DIAGNOSIS — M25662 Stiffness of left knee, not elsewhere classified: Secondary | ICD-10-CM | POA: Diagnosis not present

## 2017-11-16 DIAGNOSIS — R293 Abnormal posture: Secondary | ICD-10-CM | POA: Diagnosis not present

## 2017-11-18 DIAGNOSIS — M79652 Pain in left thigh: Secondary | ICD-10-CM | POA: Diagnosis not present

## 2017-11-18 DIAGNOSIS — M6281 Muscle weakness (generalized): Secondary | ICD-10-CM | POA: Diagnosis not present

## 2017-11-18 DIAGNOSIS — M25561 Pain in right knee: Secondary | ICD-10-CM | POA: Diagnosis not present

## 2017-11-18 DIAGNOSIS — M25562 Pain in left knee: Secondary | ICD-10-CM | POA: Diagnosis not present

## 2017-11-18 DIAGNOSIS — R2689 Other abnormalities of gait and mobility: Secondary | ICD-10-CM | POA: Diagnosis not present

## 2017-11-18 DIAGNOSIS — M25661 Stiffness of right knee, not elsewhere classified: Secondary | ICD-10-CM | POA: Diagnosis not present

## 2017-11-18 DIAGNOSIS — R293 Abnormal posture: Secondary | ICD-10-CM | POA: Diagnosis not present

## 2017-11-18 DIAGNOSIS — M79651 Pain in right thigh: Secondary | ICD-10-CM | POA: Diagnosis not present

## 2017-11-18 DIAGNOSIS — M1711 Unilateral primary osteoarthritis, right knee: Secondary | ICD-10-CM | POA: Diagnosis not present

## 2017-11-18 DIAGNOSIS — M25662 Stiffness of left knee, not elsewhere classified: Secondary | ICD-10-CM | POA: Diagnosis not present

## 2017-12-10 DIAGNOSIS — N3091 Cystitis, unspecified with hematuria: Secondary | ICD-10-CM | POA: Diagnosis not present

## 2017-12-13 DIAGNOSIS — N3090 Cystitis, unspecified without hematuria: Secondary | ICD-10-CM | POA: Diagnosis not present

## 2017-12-13 DIAGNOSIS — N3001 Acute cystitis with hematuria: Secondary | ICD-10-CM | POA: Diagnosis not present

## 2018-02-17 DIAGNOSIS — N3001 Acute cystitis with hematuria: Secondary | ICD-10-CM | POA: Diagnosis not present

## 2018-02-17 DIAGNOSIS — K219 Gastro-esophageal reflux disease without esophagitis: Secondary | ICD-10-CM | POA: Diagnosis not present

## 2018-03-02 DIAGNOSIS — H5212 Myopia, left eye: Secondary | ICD-10-CM | POA: Diagnosis not present

## 2018-03-02 DIAGNOSIS — H40002 Preglaucoma, unspecified, left eye: Secondary | ICD-10-CM | POA: Diagnosis not present

## 2018-03-02 DIAGNOSIS — H53452 Other localized visual field defect, left eye: Secondary | ICD-10-CM | POA: Diagnosis not present

## 2018-03-13 DIAGNOSIS — F5102 Adjustment insomnia: Secondary | ICD-10-CM | POA: Diagnosis not present

## 2018-03-13 DIAGNOSIS — N3000 Acute cystitis without hematuria: Secondary | ICD-10-CM | POA: Diagnosis not present

## 2018-03-13 DIAGNOSIS — M130 Polyarthritis, unspecified: Secondary | ICD-10-CM | POA: Diagnosis not present

## 2018-04-03 DIAGNOSIS — N3000 Acute cystitis without hematuria: Secondary | ICD-10-CM | POA: Diagnosis not present

## 2018-04-24 DIAGNOSIS — M353 Polymyalgia rheumatica: Secondary | ICD-10-CM | POA: Diagnosis not present

## 2018-04-24 DIAGNOSIS — Z7952 Long term (current) use of systemic steroids: Secondary | ICD-10-CM | POA: Diagnosis not present

## 2018-04-24 DIAGNOSIS — M150 Primary generalized (osteo)arthritis: Secondary | ICD-10-CM | POA: Diagnosis not present

## 2018-05-03 DIAGNOSIS — K219 Gastro-esophageal reflux disease without esophagitis: Secondary | ICD-10-CM | POA: Diagnosis not present

## 2018-05-03 DIAGNOSIS — Z0000 Encounter for general adult medical examination without abnormal findings: Secondary | ICD-10-CM | POA: Diagnosis not present

## 2018-05-03 DIAGNOSIS — D510 Vitamin B12 deficiency anemia due to intrinsic factor deficiency: Secondary | ICD-10-CM | POA: Diagnosis not present

## 2018-05-03 DIAGNOSIS — E785 Hyperlipidemia, unspecified: Secondary | ICD-10-CM | POA: Diagnosis not present

## 2018-05-03 DIAGNOSIS — I119 Hypertensive heart disease without heart failure: Secondary | ICD-10-CM | POA: Diagnosis not present

## 2018-05-03 DIAGNOSIS — Z79899 Other long term (current) drug therapy: Secondary | ICD-10-CM | POA: Diagnosis not present

## 2018-05-03 DIAGNOSIS — Z23 Encounter for immunization: Secondary | ICD-10-CM | POA: Diagnosis not present

## 2018-05-03 DIAGNOSIS — E039 Hypothyroidism, unspecified: Secondary | ICD-10-CM | POA: Diagnosis not present

## 2018-11-02 DIAGNOSIS — E785 Hyperlipidemia, unspecified: Secondary | ICD-10-CM | POA: Diagnosis not present

## 2018-11-02 DIAGNOSIS — I119 Hypertensive heart disease without heart failure: Secondary | ICD-10-CM | POA: Diagnosis not present

## 2018-11-02 DIAGNOSIS — E039 Hypothyroidism, unspecified: Secondary | ICD-10-CM | POA: Diagnosis not present

## 2018-11-02 DIAGNOSIS — E7800 Pure hypercholesterolemia, unspecified: Secondary | ICD-10-CM | POA: Diagnosis not present

## 2018-11-02 DIAGNOSIS — Z789 Other specified health status: Secondary | ICD-10-CM | POA: Diagnosis not present

## 2018-11-02 DIAGNOSIS — Z79899 Other long term (current) drug therapy: Secondary | ICD-10-CM | POA: Diagnosis not present

## 2019-04-05 DIAGNOSIS — H6121 Impacted cerumen, right ear: Secondary | ICD-10-CM | POA: Diagnosis not present

## 2019-04-05 DIAGNOSIS — R300 Dysuria: Secondary | ICD-10-CM | POA: Diagnosis not present

## 2019-04-05 DIAGNOSIS — N760 Acute vaginitis: Secondary | ICD-10-CM | POA: Diagnosis not present

## 2019-04-06 DIAGNOSIS — R300 Dysuria: Secondary | ICD-10-CM | POA: Diagnosis not present

## 2019-04-25 DIAGNOSIS — M353 Polymyalgia rheumatica: Secondary | ICD-10-CM | POA: Diagnosis not present

## 2019-04-25 DIAGNOSIS — M150 Primary generalized (osteo)arthritis: Secondary | ICD-10-CM | POA: Diagnosis not present

## 2019-04-25 DIAGNOSIS — Z7952 Long term (current) use of systemic steroids: Secondary | ICD-10-CM | POA: Diagnosis not present

## 2019-05-07 DIAGNOSIS — Z23 Encounter for immunization: Secondary | ICD-10-CM | POA: Diagnosis not present

## 2019-05-07 DIAGNOSIS — Z131 Encounter for screening for diabetes mellitus: Secondary | ICD-10-CM | POA: Diagnosis not present

## 2019-05-07 DIAGNOSIS — Z13820 Encounter for screening for osteoporosis: Secondary | ICD-10-CM | POA: Diagnosis not present

## 2019-05-07 DIAGNOSIS — E785 Hyperlipidemia, unspecified: Secondary | ICD-10-CM | POA: Diagnosis not present

## 2019-05-07 DIAGNOSIS — Z0000 Encounter for general adult medical examination without abnormal findings: Secondary | ICD-10-CM | POA: Diagnosis not present

## 2019-05-07 DIAGNOSIS — Z780 Asymptomatic menopausal state: Secondary | ICD-10-CM | POA: Diagnosis not present

## 2019-05-07 DIAGNOSIS — R9431 Abnormal electrocardiogram [ECG] [EKG]: Secondary | ICD-10-CM | POA: Diagnosis not present

## 2019-05-07 DIAGNOSIS — J3489 Other specified disorders of nose and nasal sinuses: Secondary | ICD-10-CM | POA: Diagnosis not present

## 2019-05-07 DIAGNOSIS — Z79899 Other long term (current) drug therapy: Secondary | ICD-10-CM | POA: Diagnosis not present

## 2019-05-11 ENCOUNTER — Other Ambulatory Visit: Payer: Self-pay

## 2019-05-11 ENCOUNTER — Ambulatory Visit: Payer: Medicare Other | Admitting: Cardiology

## 2019-05-11 ENCOUNTER — Encounter: Payer: Self-pay | Admitting: Cardiology

## 2019-05-11 ENCOUNTER — Ambulatory Visit (INDEPENDENT_AMBULATORY_CARE_PROVIDER_SITE_OTHER): Payer: Medicare Other | Admitting: Cardiology

## 2019-05-11 VITALS — BP 110/64 | HR 65 | Ht 67.0 in | Wt 179.0 lb

## 2019-05-11 DIAGNOSIS — R112 Nausea with vomiting, unspecified: Secondary | ICD-10-CM | POA: Insufficient documentation

## 2019-05-11 DIAGNOSIS — R9431 Abnormal electrocardiogram [ECG] [EKG]: Secondary | ICD-10-CM

## 2019-05-11 DIAGNOSIS — R079 Chest pain, unspecified: Secondary | ICD-10-CM | POA: Diagnosis not present

## 2019-05-11 DIAGNOSIS — E039 Hypothyroidism, unspecified: Secondary | ICD-10-CM | POA: Insufficient documentation

## 2019-05-11 DIAGNOSIS — M1990 Unspecified osteoarthritis, unspecified site: Secondary | ICD-10-CM | POA: Insufficient documentation

## 2019-05-11 DIAGNOSIS — Z973 Presence of spectacles and contact lenses: Secondary | ICD-10-CM | POA: Insufficient documentation

## 2019-05-11 DIAGNOSIS — I10 Essential (primary) hypertension: Secondary | ICD-10-CM | POA: Insufficient documentation

## 2019-05-11 DIAGNOSIS — Z01812 Encounter for preprocedural laboratory examination: Secondary | ICD-10-CM

## 2019-05-11 DIAGNOSIS — E785 Hyperlipidemia, unspecified: Secondary | ICD-10-CM | POA: Insufficient documentation

## 2019-05-11 MED ORDER — METOPROLOL TARTRATE 50 MG PO TABS: ORAL_TABLET | ORAL | 0 refills | Status: DC

## 2019-05-11 MED ORDER — NITROGLYCERIN 0.4 MG SL SUBL: 0.4000 mg | SUBLINGUAL_TABLET | SUBLINGUAL | 3 refills | Status: DC | PRN

## 2019-05-11 NOTE — Progress Notes (Signed)
Cardiology Office Note:    Date:  05/11/2019   ID:  [name], DOB [birth year], MRN [id number]  PCP:  Cyndy Freeze, MD  Cardiologist:  No primary care provider on file.  Electrophysiologist:  None   Referring MD: [name], MD   Chief Complaint  Patient presents with  . Abnormal ECG   History of Present Illness:    [name] is a 78 y.o. female with a hx of hypertension, hyperlipidemia, hypothyroid with significant family history of coronary disease by her PCP for chest pain. The patient describes the pain as a left sided chest pain. Its a burning sensation that when occurs radiates to her jaw and elbow. Nothing makes it better or worse.  The pain usually last for few minutes. She quantifies as a 3/10. This pain is new and has never occurred in the past.  She tell me that she does have significant family history with her brother and sister both getting stents in their early 68s.   No chest pain during our encounter.   Past Medical History:  Diagnosis Date  . Arthritis   . Hyperlipemia   . Hypertension   . Hypothyroidism   . Osteoarthritis of left knee 09/20/2017  . PONV (postoperative nausea and vomiting)    "years ago"  . Primary osteoarthritis of right knee 03/11/2016  . Wears glasses     Past Surgical History:  Procedure Laterality Date  . ABDOMINAL HYSTERECTOMY  1978  . ACHILLES TENDON SURGERY Right 06/27/2014   Procedure: RIGHT ACHILLES DEBRIDEMENT AND REPAIR WITH TRANSFER OF FLEXOR HALLUCIS LONGUS TENDON TO THE CALCANEUS  ;  Surgeon: [name], MD;  Location: [place];  Service: Orthopedics;  Laterality: Right;  . ACHILLES TENDON SURGERY Left 11/28/2014   Procedure: LEFT ACHILLES DEBRIDEMENT AND REPAIR/GASTROC RECESSION;  Surgeon: [name], MD;  Location: [place];  Service: Orthopedics;  Laterality: Left;  . COLONOSCOPY    . EYE SURGERY  2014,2015   cataracts-both  . GASTROC RECESSION EXTREMITY Left  11/28/2014   Procedure: GASTROC RECESSION EXTREMITY;  Surgeon: [name], MD;  Location: [place];  Service: Orthopedics;  Laterality: Left;  . KNEE ARTHROPLASTY Right 03/11/2016   Procedure: RIGHT TOTAL KNEE ARTHROPLASTY WITH NAVIGATION;  Surgeon: Rod Can, MD;  Location: [facility];  Service: Orthopedics;  Laterality: Right;  . TONSILLECTOMY      Current Medications: Current Meds  Medication Sig  . ascorbic acid (VITAMIN C) 500 MG tablet Take 500 mg by mouth daily.  [name] aspirin 81 MG chewable tablet Chew 1 tablet (81 mg total) by mouth 2 (two) times daily.  . calcium carbonate (OS-CAL) 600 MG TABS tablet Take 600 mg by mouth 2 (two) times daily with a meal.  . diclofenac (VOLTAREN) 75 MG EC tablet Take 75 mg by mouth 2 (two) times daily.  [name] esomeprazole (NEXIUM) 20 MG capsule Take 20 mg by mouth daily at 12 noon.  . ezetimibe (ZETIA) 10 MG tablet 10 mg daily.  [name] levocetirizine (XYZAL) 5 MG tablet Take 5 mg by mouth every evening.  [name] levothyroxine (SYNTHROID, LEVOTHROID) 100 MCG tablet Take 100 mcg by mouth daily before breakfast.  . lisinopril-hydrochlorothiazide (ZESTORETIC) 10-12.5 MG tablet lisinopril 10 mg-hydrochlorothiazide 12.5 mg tablet  . MegaRed Omega-3 Krill Oil 500 MG CAPS Take by mouth.  . [DISCONTINUED] lisinopril (PRINIVIL,ZESTRIL) 10 MG tablet Take 10 mg by mouth daily.     Allergies:   Patient has no known allergies.  Social History   Socioeconomic History  . Marital status: Married    Spouse name: Not on file  . Number of children: Not on file  . Years of education: Not on file  . Highest education level: Not on file  Occupational History  . Not on file  Social Needs  . Financial resource strain: Not on file  . Food insecurity    Worry: Not on file    Inability: Not on file  . Transportation needs    Medical: Not on file    Non-medical: Not on file  Tobacco Use  . Smoking status: Never Smoker  . Smokeless tobacco: Never Used   Substance and Sexual Activity  . Alcohol use: No  . Drug use: No  . Sexual activity: Not on file  Lifestyle  . Physical activity    Days per week: Not on file    Minutes per session: Not on file  . Stress: Not on file  Relationships  . Social Herbalist on phone: Not on file    Gets together: Not on file    Attends religious service: Not on file    Active member of club or organization: Not on file    Attends meetings of clubs or organizations: Not on file    Relationship status: Not on file  Other Topics Concern  . Not on file  Social History Narrative  . Not on file     Family History: The patient's family history is not on file.  ROS:   Review of Systems  Constitution: Negative for decreased appetite, fever and weight gain.  HENT: Negative for congestion, ear discharge, hoarse voice and sore throat.   Eyes: Negative for discharge, redness, vision loss in right eye and visual halos.  Cardiovascular: Negative for chest pain, dyspnea on exertion, leg swelling, orthopnea and palpitations.  Respiratory: Negative for cough, hemoptysis, shortness of breath and snoring.   Endocrine: Negative for heat intolerance and polyphagia.  Hematologic/Lymphatic: Negative for bleeding problem. Does not bruise/bleed easily.  Skin: Negative for flushing, nail changes, rash and suspicious lesions.  Musculoskeletal: Negative for arthritis, joint pain, muscle cramps, myalgias, neck pain and stiffness.  Gastrointestinal: Negative for abdominal pain, bowel incontinence, diarrhea and excessive appetite.  Genitourinary: Negative for decreased libido, genital sores and incomplete emptying.  Neurological: Negative for brief paralysis, focal weakness, headaches and loss of balance.  Psychiatric/Behavioral: Negative for altered mental status, depression and suicidal ideas.  Allergic/Immunologic: Negative for HIV exposure and persistent infections.    EKGs/Labs/Other Studies Reviewed:     The following studies were reviewed today:   EKG:  The ekg ordered today demonstrates sinus rhythm with arrhythmia, HR 62 bom. No other eck for comparison.  Recent Labs: No results found for requested labs within last 8760 hours.  Recent Lipid Panel No results found for: CHOL, TRIG, HDL, CHOLHDL, VLDL, LDLCALC, LDLDIRECT  Physical Exam:    VS:  BP 110/64 (BP Location: Right Arm, Patient Position: Sitting, Cuff Size: Normal)   Pulse 65   Ht 5\' 7"  (1.702 m)   Wt 179 lb (81.2 kg)   SpO2 98%   BMI 28.04 kg/m     Wt Readings from Last 3 Encounters:  05/11/19 179 lb (81.2 kg)  03/11/16 162 lb (73.5 kg)  11/28/14 168 lb (76.2 kg)     GEN: Well nourished, well developed in no acute distress HEENT: Normal NECK: No JVD; No carotid bruits LYMPHATICS: No lymphadenopathy CARDIAC: S1S2 noted,RRR, no murmurs,  rubs, gallops RESPIRATORY:  Clear to auscultation without rales, wheezing or rhonchi  ABDOMEN: Soft, non-tender, non-distended, +bowel sounds, no guarding. EXTREMITIES: No edema, No cyanosis, no clubbing MUSCULOSKELETAL:  No edema; No deformity  SKIN: Warm and dry NEUROLOGIC:  Alert and oriented x 3, non-focal PSYCHIATRIC:  Normal affect, good insight  ASSESSMENT:    1. Chest pain, unspecified type   2. Pre-procedure lab exam   3. Nonspecific abnormal electrocardiogram (ECG) (EKG)   4. Hypertension, unspecified type    PLAN:    1. I have discussed with patient that given her symptoms, medical hx and family history, its best she undergo a stress test CTA coronaries will be appropriate. She does not ave any contrast allergy. Sublingual nitroglycerin prescription was sent, its protocol and 911 protocol explained and the patient vocalized understanding questions were answered to the patient's satisfaction.  2. A TTE will be ordered for RV/LV function, and any wall motion abnormalities.  3. She had recent lipid panel at her pcp office and due to elevation she was started on  Zetia 10mg  daily. She is unable to tolerate statins.  The patient is in agreement with the above plan. The patient left the office in stable condition.  The patient will follow up in 3 months or sooner if needed.   Medication Adjustments/Labs and Tests Ordered: Current medicines are reviewed at length with the patient today.  Concerns regarding medicines are outlined above.  Orders Placed This Encounter  Procedures  . CT CORONARY FRACTIONAL FLOW RESERVE DATA PREP  . CT CORONARY FRACTIONAL FLOW RESERVE FLUID ANALYSIS  . CT CORONARY MORPH W/CTA COR W/SCORE W/CA W/CM &/OR WO/CM  . Basic Metabolic Panel (BMET)  . EKG 12-Lead  . ECHOCARDIOGRAM COMPLETE   Meds ordered this encounter  Medications  . nitroGLYCERIN (NITROSTAT) 0.4 MG SL tablet    Sig: Place 1 tablet (0.4 mg total) under the tongue every 5 (five) minutes as needed for chest pain.    Dispense:  30 tablet    Refill:  3  . DISCONTD: metoprolol tartrate (LOPRESSOR) 50 MG tablet    Sig: Take 2 tabs(100 mg) 2 hours prior to CT if heart rate greater than 55    Dispense:  2 tablet    Refill:  0  . metoprolol tartrate (LOPRESSOR) 50 MG tablet    Sig: Take 1 tabs(50 mg) 2 hours prior to CT if heart rate greater than 55    Dispense:  1 tablet    Refill:  0    Patient Instructions  Medication Instructions:  Your physician has recommended you make the following change in your medication:   Nitroglycerin 0.4 mg sublingual (under your tongue) as needed for chest pain. If experiencing chest pain, stop what you are doing and sit down. Take 1 nitroglycerin and wait 5 minutes. If chest pain continues, take another nitroglycerin and wait 5 minutes. If chest pain does not subside, take 1 more nitroglycerin and dial 911. You make take a total of 3 nitroglycerin in a 15 minute time frame.   *If you need a refill on your cardiac medications before your next appointment, please call your pharmacy*  Lab Work: Your physician recommends that you  return for lab work in:  3-7 days prior to CT: BMP  If you have labs (blood work) drawn today and your tests are completely normal, you will receive your results only by: [name] MyChart Message (if you have MyChart) OR . A paper copy in the mail If you  have any lab test that is abnormal or we need to change your treatment, we will call you to review the results.  Testing/Procedures: Your physician has requested that you have an echocardiogram. Echocardiography is a painless test that uses sound waves to create images of your heart. It provides your doctor with information about the size and shape of your heart and how well your heart's chambers and valves are working. This procedure takes approximately one hour. There are no restrictions for this procedure.  Your physician has requested that you have cardiac CT. Cardiac computed tomography (CT) is a painless test that uses an x-ray machine to take clear, detailed pictures of your heart. For further information please visit HugeFiesta.tn. Please follow instruction sheet as given.  Your cardiac CT will be scheduled at one of the below locations:   [facility] [address] [place], [place] [address] [phone]  If scheduled at [facility], please arrive at the [facility] main entrance of [facility] 30-45 minutes prior to test start time. Proceed to the [facility] Radiology Department (first floor) to check-in and test prep.    Please follow these instructions carefully (unless otherwise directed):  On the Night Before the Test: . Be sure to Drink plenty of water. . Do not consume any caffeinated/decaffeinated beverages or chocolate 12 hours prior to your test. . Do not take any antihistamines 12 hours prior to your test.  On the Day of the Test: . Drink plenty of water. Do not drink any water within one hour of the test. . Do not eat any food 4 hours prior to the test. . You may take your regular  medications prior to the test.  . Take metoprolol (Lopressor) two hours prior to test. . FEMALES- please wear underwire-free bra if available   *For Clinical Staff only. Please instruct patient the following:*        -Drink plenty of water       -Hold Furosemide/hydrochlorothiazide morning of the test       -Take metoprolol (Lopressor) 2 hours prior to test (if applicable).                  -If HR is less than 55 BPM- No Beta Blocker                                 -If HR is greater than 55 BPM and patient is greater than 12 yrs old Lopressor 50 mg x1.            After the Test: . Drink plenty of water. . After receiving IV contrast, you may experience a mild flushed feeling. This is normal. . On occasion, you may experience a mild rash up to 24 hours after the test. This is not dangerous. If this occurs, you can take Benadryl 25 mg and increase your fluid intake. . If you experience trouble breathing, this can be serious. If it is severe call 911 IMMEDIATELY. If it is mild, please call our office.  Once we have confirmed authorization from your insurance company, we will call you to set up a date and time for your test.   For non-scheduling related questions, please contact the cardiac imaging nurse navigator should you have any questions/concerns: Marchia Bond, RN Navigator Cardiac Imaging [facility] [phone] Office   Follow-Up: At [facility], you and your health  needs are our priority.  As part of our continuing mission to provide you with exceptional heart care, we have created designated Provider Care Teams.  These Care Teams include your primary Cardiologist (physician) and Advanced Practice Providers (APPs -  Physician Assistants and Nurse Practitioners) who all work together to provide you with the care you need, when you need it.  Your next appointment:   3 months  The format for your next appointment:   In Person  Provider:    [name], DO  Other Instructions      Adopting a Healthy Lifestyle.  Know what a healthy weight is for you (roughly BMI <25) and aim to maintain this   Aim for 7+ servings of fruits and vegetables daily   65-80+ fluid ounces of water or unsweet tea for healthy kidneys   Limit to max 1 drink of alcohol per day; avoid smoking/tobacco   Limit animal fats in diet for cholesterol and heart health - choose grass fed whenever available   Avoid highly processed foods, and foods high in saturated/trans fats   Aim for low stress - take time to unwind and care for your mental health   Aim for 150 min of moderate intensity exercise weekly for heart health, and weights twice weekly for bone health   Aim for 7-9 hours of sleep daily   When it comes to diets, agreement about the perfect plan isnt easy to find, even among the experts. Experts at the [employer] developed an idea known as the Healthy Eating Plate. Just imagine a plate divided into logical, healthy portions.   The emphasis is on diet quality:   Load up on vegetables and fruits - one-half of your plate: Aim for color and variety, and remember that potatoes dont count.   Go for whole grains - one-quarter of your plate: Whole wheat, barley, wheat berries, quinoa, oats, brown rice, and foods made with them. If you want pasta, go with whole wheat pasta.   Protein power - one-quarter of your plate: Fish, chicken, beans, and nuts are all healthy, versatile protein sources. Limit red meat.   The diet, however, does go beyond the plate, offering a few other suggestions.   Use healthy plant oils, such as olive, canola, soy, corn, sunflower and peanut. Check the labels, and avoid partially hydrogenated oil, which have unhealthy trans fats.   If youre thirsty, drink water. Coffee and tea are good in moderation, but skip sugary drinks and limit milk and dairy products to one or two daily servings.   The type  of carbohydrate in the diet is more important than the amount. Some sources of carbohydrates, such as vegetables, fruits, whole grains, and beans-are healthier than others.   Finally, stay active  Signed, [name], DO  05/11/2019 10:36 PM    [facility]

## 2019-05-11 NOTE — Patient Instructions (Signed)
Medication Instructions:  Your physician has recommended you make the following change in your medication:   Nitroglycerin 0.4 mg sublingual (under your tongue) as needed for chest pain. If experiencing chest pain, stop what you are doing and sit down. Take 1 nitroglycerin and wait 5 minutes. If chest pain continues, take another nitroglycerin and wait 5 minutes. If chest pain does not subside, take 1 more nitroglycerin and dial 911. You make take a total of 3 nitroglycerin in a 15 minute time frame.   *If you need a refill on your cardiac medications before your next appointment, please call your pharmacy*  Lab Work: Your physician recommends that you return for lab work in:  3-7 days prior to CT: BMP  If you have labs (blood work) drawn today and your tests are completely normal, you will receive your results only by: [name] MyChart Message (if you have MyChart) OR . A paper copy in the mail If you have any lab test that is abnormal or we need to change your treatment, we will call you to review the results.  Testing/Procedures: Your physician has requested that you have an echocardiogram. Echocardiography is a painless test that uses sound waves to create images of your heart. It provides your doctor with information about the size and shape of your heart and how well your heart's chambers and valves are working. This procedure takes approximately one hour. There are no restrictions for this procedure.  Your physician has requested that you have cardiac CT. Cardiac computed tomography (CT) is a painless test that uses an x-ray machine to take clear, detailed pictures of your heart. For further information please visit HugeFiesta.tn. Please follow instruction sheet as given.  Your cardiac CT will be scheduled at one of the below locations:   [facility] [address] [place], [place] [address] [phone]  If scheduled at [facility], please arrive at the  [facility] main entrance of [facility] 30-45 minutes prior to test start time. Proceed to the [facility] Radiology Department (first floor) to check-in and test prep.    Please follow these instructions carefully (unless otherwise directed):  On the Night Before the Test: . Be sure to Drink plenty of water. . Do not consume any caffeinated/decaffeinated beverages or chocolate 12 hours prior to your test. . Do not take any antihistamines 12 hours prior to your test.  On the Day of the Test: . Drink plenty of water. Do not drink any water within one hour of the test. . Do not eat any food 4 hours prior to the test. . You may take your regular medications prior to the test.  . Take metoprolol (Lopressor) two hours prior to test. . FEMALES- please wear underwire-free bra if available   *For Clinical Staff only. Please instruct patient the following:*        -Drink plenty of water       -Hold Furosemide/hydrochlorothiazide morning of the test       -Take metoprolol (Lopressor) 2 hours prior to test (if applicable).                  -If HR is less than 55 BPM- No Beta Blocker                                 -If HR is greater than 55 BPM and patient is greater than 75 yrs  old Lopressor 50 mg x1.            After the Test: . Drink plenty of water. . After receiving IV contrast, you may experience a mild flushed feeling. This is normal. . On occasion, you may experience a mild rash up to 24 hours after the test. This is not dangerous. If this occurs, you can take Benadryl 25 mg and increase your fluid intake. . If you experience trouble breathing, this can be serious. If it is severe call 911 IMMEDIATELY. If it is mild, please call our office.  Once we have confirmed authorization from your insurance company, we will call you to set up a date and time for your test.   For non-scheduling related questions, please contact the cardiac imaging nurse navigator should you have any  questions/concerns: Marchia Bond, RN Navigator Cardiac Imaging [facility] [phone] Office   Follow-Up: At [facility], you and your health needs are our priority.  As part of our continuing mission to provide you with exceptional heart care, we have created designated Provider Care Teams.  These Care Teams include your primary Cardiologist (physician) and Advanced Practice Providers (APPs -  Physician Assistants and Nurse Practitioners) who all work together to provide you with the care you need, when you need it.  Your next appointment:   3 months  The format for your next appointment:   In Person  Provider:   [name], DO  Other Instructions

## 2019-05-17 DIAGNOSIS — Z01812 Encounter for preprocedural laboratory examination: Secondary | ICD-10-CM | POA: Diagnosis not present

## 2019-05-18 LAB — BASIC METABOLIC PANEL
BUN/Creatinine Ratio: 30 — ABNORMAL HIGH (ref 12–28)
BUN: 26 mg/dL (ref 8–27)
CO2: 26 mmol/L (ref 20–29)
Calcium: 9.6 mg/dL (ref 8.7–10.3)
Chloride: 102 mmol/L (ref 96–106)
Creatinine, Ser: 0.86 mg/dL (ref 0.57–1.00)
GFR calc Af Amer: 74 mL/min/{1.73_m2} (ref >59)
GFR calc non Af Amer: 64 mL/min/{1.73_m2} (ref >59)
Glucose: 83 mg/dL (ref 65–99)
Potassium: 4.2 mmol/L (ref 3.5–5.2)
Sodium: 141 mmol/L (ref 134–144)

## 2019-06-20 ENCOUNTER — Telehealth (HOSPITAL_COMMUNITY): Payer: Self-pay | Admitting: Emergency Medicine

## 2019-06-20 ENCOUNTER — Ambulatory Visit (INDEPENDENT_AMBULATORY_CARE_PROVIDER_SITE_OTHER): Payer: Medicare Other

## 2019-06-20 ENCOUNTER — Other Ambulatory Visit: Payer: Self-pay

## 2019-06-20 DIAGNOSIS — R079 Chest pain, unspecified: Secondary | ICD-10-CM | POA: Diagnosis not present

## 2019-06-20 DIAGNOSIS — R9431 Abnormal electrocardiogram [ECG] [EKG]: Secondary | ICD-10-CM | POA: Diagnosis not present

## 2019-06-20 DIAGNOSIS — I10 Essential (primary) hypertension: Secondary | ICD-10-CM

## 2019-06-20 NOTE — Progress Notes (Signed)
Complete echocardiogram has been performed.  Jimmy Hisham Provence RDCS, RVT 

## 2019-06-20 NOTE — Telephone Encounter (Signed)
Reaching out to patient to offer assistance regarding upcoming cardiac imaging study; pt verbalizes understanding of appt date/time, parking situation and where to check in, pre-test NPO status and medications ordered, and verified current allergies; name and call back number provided for further questions should they arise Pearlean Sabina RN Navigator Cardiac Imaging [facility] [phone] office [phone] cell 

## 2019-06-21 ENCOUNTER — Ambulatory Visit (HOSPITAL_COMMUNITY)
Admission: RE | Admit: 2019-06-21 | Discharge: 2019-06-21 | Disposition: A | Discharge status: Home / Self Care | Payer: Medicare Other | Source: Ambulatory Visit | Attending: Cardiology | Admitting: Cardiology

## 2019-06-21 DIAGNOSIS — R079 Chest pain, unspecified: Secondary | ICD-10-CM | POA: Diagnosis not present

## 2019-06-21 DIAGNOSIS — E785 Hyperlipidemia, unspecified: Secondary | ICD-10-CM | POA: Diagnosis not present

## 2019-06-21 DIAGNOSIS — I10 Essential (primary) hypertension: Secondary | ICD-10-CM | POA: Insufficient documentation

## 2019-06-21 DIAGNOSIS — I2510 Atherosclerotic heart disease of native coronary artery without angina pectoris: Secondary | ICD-10-CM | POA: Diagnosis not present

## 2019-06-21 MED ORDER — NITROGLYCERIN 0.4 MG SL SUBL
0.8000 mg | SUBLINGUAL_TABLET | Freq: Once | SUBLINGUAL | Status: AC
  Administered 2019-06-21: 09:00:00 0.8 mg via SUBLINGUAL

## 2019-06-21 MED ORDER — IOHEXOL 350 MG/ML SOLN
80.0000 mL | Freq: Once | INTRAVENOUS | Status: AC | PRN
  Administered 2019-06-21: 09:00:00 80 mL via INTRAVENOUS

## 2019-06-21 MED ORDER — NITROGLYCERIN 0.4 MG SL SUBL
SUBLINGUAL_TABLET | SUBLINGUAL | Status: AC
  Filled 2019-06-21: qty 2

## 2019-06-22 ENCOUNTER — Encounter: Payer: Self-pay | Admitting: *Deleted

## 2019-06-24 DIAGNOSIS — I2510 Atherosclerotic heart disease of native coronary artery without angina pectoris: Secondary | ICD-10-CM | POA: Diagnosis not present

## 2019-07-17 ENCOUNTER — Ambulatory Visit (INDEPENDENT_AMBULATORY_CARE_PROVIDER_SITE_OTHER): Payer: Medicare Other | Admitting: Cardiology

## 2019-07-17 ENCOUNTER — Other Ambulatory Visit: Payer: Self-pay

## 2019-07-17 ENCOUNTER — Encounter: Payer: Self-pay | Admitting: Cardiology

## 2019-07-17 VITALS — BP 110/68 | HR 88 | Ht 67.0 in | Wt 183.0 lb

## 2019-07-17 DIAGNOSIS — I2510 Atherosclerotic heart disease of native coronary artery without angina pectoris: Secondary | ICD-10-CM | POA: Diagnosis not present

## 2019-07-17 DIAGNOSIS — I10 Essential (primary) hypertension: Secondary | ICD-10-CM

## 2019-07-17 DIAGNOSIS — E782 Mixed hyperlipidemia: Secondary | ICD-10-CM | POA: Diagnosis not present

## 2019-07-17 MED ORDER — PRALUENT 75 MG/ML ~~LOC~~ SOAJ: 75.0000 mg | SUBCUTANEOUS | 1 refills | Status: DC

## 2019-07-17 MED ORDER — CLOPIDOGREL BISULFATE 75 MG PO TABS: 75.0000 mg | ORAL_TABLET | Freq: Every day | ORAL | 3 refills | Status: DC

## 2019-07-17 NOTE — Patient Instructions (Signed)
Medication Instructions:  Your physician has recommended you make the following change in your medication:   START: Plavix(clopidogrel) 75 mg Take 1 tab daily STAR: Praluent 75 mg Inject 75 mg (1 pen) into the skin once every 14 days   *If you need a refill on your cardiac medications before your next appointment, please call your pharmacy*  Lab Work: BEFORE NEXT VISIT: Lipid,Liprprotein A  If you have labs (blood work) drawn today and your tests are completely normal, you will receive your results only by: [name] MyChart Message (if you have MyChart) OR . A paper copy in the mail If you have any lab test that is abnormal or we need to change your treatment, we will call you to review the results.  Testing/Procedures: None  Follow-Up: At [facility], you and your health needs are our priority.  As part of our continuing mission to provide you with exceptional heart care, we have created designated Provider Care Teams.  These Care Teams include your primary Cardiologist (physician) and Advanced Practice Providers (APPs -  Physician Assistants and Nurse Practitioners) who all work together to provide you with the care you need, when you need it.  Your next appointment:   3 month(s)  The format for your next appointment:   In Person  Provider:   [name], DO  Other Instructions Clopidogrel tablets What is this medicine? CLOPIDOGREL (kloh PID oh grel) helps to prevent blood clots. This medicine is used to prevent heart attack, stroke, or other vascular events in people who are at high risk. This medicine may be used for other purposes; ask your health care provider or pharmacist if you have questions. COMMON BRAND NAME(S): Plavix What should I tell my health care provider before I take this medicine? They need to know if you have any of the following conditions:  bleeding disorders  bleeding in the brain  having surgery  history of stomach bleeding  an unusual or  allergic reaction to clopidogrel, other medicines, foods, dyes, or preservatives  pregnant or trying to get pregnant  breast-feeding How should I use this medicine? Take this medicine by mouth with a glass of water. Follow the directions on the prescription label. You may take this medicine with or without food. If it upsets your stomach, take it with food. Take your medicine at regular intervals. Do not take it more often than directed. Do not stop taking except on your doctor's advice. A special MedGuide will be given to you by the pharmacist with each prescription and refill. Be sure to read this information carefully each time. Talk to your pediatrician regarding the use of this medicine in children. Special care may be needed. Overdosage: If you think you have taken too much of this medicine contact a poison control center or emergency room at once. NOTE: This medicine is only for you. Do not share this medicine with others. What if I miss a dose? If you miss a dose, take it as soon as you can. If it is almost time for your next dose, take only that dose. Do not take double or extra doses. What may interact with this medicine? Do not take this medicine with the following medications:  dasabuvir; ombitasvir; paritaprevir; ritonavir  defibrotide  selexipag This medicine may also interact with the following medications:  certain medicines that treat or prevent blood clots like warfarin  narcotic medicines for pain  NSAIDs, medicines for pain and inflammation, like ibuprofen or naproxen  repaglinide  SNRIs,  medicines for depression, like desvenlafaxine, duloxetine, levomilnacipran, venlafaxine  SSRIs, medicines for depression, like citalopram, escitalopram, fluoxetine, fluvoxamine, paroxetine, sertraline  stomach acid blockers like cimetidine, esomeprazole, omeprazole This list may not describe all possible interactions. Give your health care provider a list of all the medicines,  herbs, non-prescription drugs, or dietary supplements you use. Also tell them if you smoke, drink alcohol, or use illegal drugs. Some items may interact with your medicine. What should I watch for while using this medicine? Visit your doctor or health care professional for regular check-ups. Do not stop taking your medicine unless your doctor tells you to. Notify your doctor or health care professional and seek emergency treatment if you develop breathing problems; changes in vision; chest pain; severe, sudden headache; pain, swelling, warmth in the leg; trouble speaking; sudden numbness or weakness of the face, arm or leg. These can be signs that your condition has gotten worse. If you are going to have surgery or dental work, tell your doctor or health care professional that you are taking this medicine. Certain genetic factors may reduce the effect of this medicine. Your doctor may use genetic tests to determine treatment. Only take aspirin if you are instructed to. Low doses of aspirin are used with this medicine to treat some conditions. Taking aspirin with this medicine can increase your risk of bleeding so you must be careful. Talk to your doctor or pharmacist if you have questions. What side effects may I notice from receiving this medicine? Side effects that you should report to your doctor or health care professional as soon as possible:  allergic reactions like skin rash, itching or hives, swelling of the face, lips, or tongue  signs and symptoms of bleeding such as bloody or black, tarry stools; red or dark-brown urine; spitting up blood or brown material that looks like coffee grounds; red spots on the skin; unusual bruising or bleeding from the eye, gums, or nose  signs and symptoms of a blood clot such as breathing problems; changes in vision; chest pain; severe, sudden headache; pain, swelling, warmth in the leg; trouble speaking; sudden numbness or weakness of the face, arm or  leg  signs and symptoms of low blood sugar such as feeling anxious; confusion; dizziness; increased hunger; unusually weak or tired; increased sweating; shakiness; cold, clammy skin; irritable; headache; blurred vision; fast heartbeat; loss of consciousness Side effects that usually do not require medical attention (report to your doctor or health care professional if they continue or are bothersome):  constipation  diarrhea  headache  upset stomach This list may not describe all possible side effects. Call your doctor for medical advice about side effects. You may report side effects to FDA at 1-800-FDA-1088. Where should I keep my medicine? Keep out of the reach of children. Store at room temperature of 59 to 86 degrees F (15 to 30 degrees C). Throw away any unused medicine after the expiration date. NOTE: This sheet is a summary. It may not cover all possible information. If you have questions about this medicine, talk to your doctor, pharmacist, or health care provider.  2020 [employer] Standard (2017-12-05 15:03:38) Alirocumab injection What is this medicine? ALIROCUMAB (al i ROC ue mab) is known as a PCSK9 inhibitor. It is used to lower the level of cholesterol in the blood. It may be used alone or in combination with other cholesterol-lowering drugs. This drug may also be used to reduce the risk of heart attack, stroke, and certain types of chest pain (  unstable angina) that may need hospitalization. This medicine may be used for other purposes; ask your health care provider or pharmacist if you have questions. COMMON BRAND NAME(S): Praluent What should I tell my health care provider before I take this medicine? They need to know if you have any of these conditions:  any unusual or allergic reaction to alirocumab, other medicines, foods, dyes, or preservatives  pregnant or trying to get pregnant  breast-feeding How should I use this medicine? This medicine is for injection  under the skin. You will be taught how to prepare and give this medicine. Use exactly as directed. Take your medicine at regular intervals. Do not take your medicine more often than directed. It is important that you put your used needles and syringes in a special sharps container. Do not put them in a trash can. If you do not have a sharps container, call your pharmacist or healthcare provider to get one. Talk to your pediatrician regarding the use of this medicine in children. Special care may be needed. Overdosage: If you think you have taken too much of this medicine contact a poison control center or emergency room at once. NOTE: This medicine is only for you. Do not share this medicine with others. What if I miss a dose? If you are on an every 2 week schedule and miss a dose, take it as soon as you can. If your next dose is to be taken in less than 7 days, then do not take the missed dose. Take the next dose at your regular time. Do not take double or extra doses. If you are on a monthly schedule and miss a dose, take it as soon as you can. If the dose given is within 7 days of the missed dose, continue with your regular monthly schedule. If the missed dose is administered after 7 days of the original date, administer the dose and start a new monthly schedule based on this date. What may interact with this medicine? Interactions are not expected. This list may not describe all possible interactions. Give your health care provider a list of all the medicines, herbs, non-prescription drugs, or dietary supplements you use. Also tell them if you smoke, drink alcohol, or use illegal drugs. Some items may interact with your medicine. What should I watch for while using this medicine? You may need blood work done while you are taking this medicine. What side effects may I notice from receiving this medicine? Side effects that you should report to your doctor or health care professional as soon as  possible:  allergic reactions like skin rash, itching or hives, swelling of the face, lips, or tongue  signs and symptoms of infection like fever or chills; cough; sore throat; pain or trouble passing urine  signs and symptoms of liver injury like dark yellow or brown urine; general ill feeling or flu-like symptoms; light-colored stools; loss of appetite; nausea; right upper belly pain; unusually weak or tired; yellowing of the eyes or skin Side effects that usually do not require medical attention (report to your doctor or health care professional if they continue or are bothersome):  diarrhea  muscle cramps  muscle pain  pain, redness, or irritation at site where injected This list may not describe all possible side effects. Call your doctor for medical advice about side effects. You may report side effects to FDA at 1-800-FDA-1088. Where should I keep my medicine? Keep out of the reach of children. You will be instructed  on how to store this medicine. Throw away any unused medicine after the expiration date on the label. NOTE: This sheet is a summary. It may not cover all possible information. If you have questions about this medicine, talk to your doctor, pharmacist, or health care provider.  2020 [employer] Standard (2017-11-17 22:02:52)

## 2019-07-17 NOTE — Progress Notes (Addendum)
Cardiology Office Note:    Date:  07/18/2019   ID:  [name], DOB March 22, 1941, MRN [id number]  PCP:  [name], MD  Cardiologist:  [name], DO  Electrophysiologist:  None   Referring MD: [name], MD   Follow-up visit for coronary artery disease  History of Present Illness:    [name] is a 78 y.o. female with a hx of hypertension, hyperlipidemia, significant family history of coronary disease who presented initially on May 11, 2019 to be evaluated for chest pain.  At that time I recommended patient undergo CTA coronaries as well as an echocardiogram.  She did get her CTA coronaries which show evidence of coronary artery disease  She is here today for follow-up visit.  She denies any chest pain at this time  Past Medical History:  Diagnosis Date  . Arthritis   . Hyperlipemia   . Hypertension   . Hypothyroidism   . Osteoarthritis of left knee 09/20/2017  . PONV (postoperative nausea and vomiting)    "years ago"  . Primary osteoarthritis of right knee 03/11/2016  . Wears glasses     Past Surgical History:  Procedure Laterality Date  . ABDOMINAL HYSTERECTOMY  1978  . ACHILLES TENDON SURGERY Right 06/27/2014   Procedure: RIGHT ACHILLES DEBRIDEMENT AND REPAIR WITH TRANSFER OF FLEXOR HALLUCIS LONGUS TENDON TO THE CALCANEUS  ;  Surgeon: [name], MD;  Location: [facility];  Service: Orthopedics;  Laterality: Right;  . ACHILLES TENDON SURGERY Left 11/28/2014   Procedure: LEFT ACHILLES DEBRIDEMENT AND REPAIR/GASTROC RECESSION;  Surgeon: [name], MD;  Location: [facility];  Service: Orthopedics;  Laterality: Left;  . COLONOSCOPY    . EYE SURGERY  2014,2015   cataracts-both  . GASTROC RECESSION EXTREMITY Left 11/28/2014   Procedure: GASTROC RECESSION EXTREMITY;  Surgeon: [name], MD;  Location: [facility];  Service: Orthopedics;  Laterality: Left;  . KNEE ARTHROPLASTY Right 03/11/2016   Procedure: RIGHT TOTAL KNEE ARTHROPLASTY WITH NAVIGATION;  Surgeon: Rod Can, MD;  Location: [facility];  Service: Orthopedics;  Laterality: Right;  . TONSILLECTOMY      Current Medications: Current Meds  Medication Sig  . ascorbic acid (VITAMIN C) 500 MG tablet Take 500 mg by mouth daily.  [name] aspirin 81 MG chewable tablet Chew 1 tablet (81 mg total) by mouth 2 (two) times daily.  . calcium carbonate (OS-CAL) 600 MG TABS tablet Take 600 mg by mouth 2 (two) times daily with a meal.  . diclofenac (VOLTAREN) 75 MG EC tablet Take 75 mg by mouth 2 (two) times daily.  [name] esomeprazole (NEXIUM) 20 MG capsule Take 20 mg by mouth daily at 12 noon.  . ezetimibe (ZETIA) 10 MG tablet 10 mg daily.  [name] levocetirizine (XYZAL) 5 MG tablet Take 5 mg by mouth every evening.  [name] levothyroxine (SYNTHROID, LEVOTHROID) 100 MCG tablet Take 100 mcg by mouth daily before breakfast.  . lisinopril-hydrochlorothiazide (ZESTORETIC) 10-12.5 MG tablet lisinopril 10 mg-hydrochlorothiazide 12.5 mg tablet  . MegaRed Omega-3 Krill Oil 500 MG CAPS Take by mouth.  . nitroGLYCERIN (NITROSTAT) 0.4 MG SL tablet Place 1 tablet (0.4 mg total) under the tongue every 5 (five) minutes as needed for chest pain.     Allergies:   Patient has no known allergies.   Social History   Socioeconomic History  . Marital status: Married    Spouse name: Not on file  . Number of children: Not on file  . Years of education:  Not on file  . Highest education level: Not on file  Occupational History  . Not on file  Tobacco Use  . Smoking status: Never Smoker  . Smokeless tobacco: Never Used  Substance and Sexual Activity  . Alcohol use: No  . Drug use: No  . Sexual activity: Not on file  Other Topics Concern  . Not on file  Social History Narrative  . Not on file   Social Determinants of Health   Financial Resource Strain:   . Difficulty of Paying Living Expenses: Not on file  Food Insecurity:   . Worried About Charity fundraiser  in the Last Year: Not on file  . Ran Out of Food in the Last Year: Not on file  Transportation Needs:   . Lack of Transportation (Medical): Not on file  . Lack of Transportation (Non-Medical): Not on file  Physical Activity:   . Days of Exercise per Week: Not on file  . Minutes of Exercise per Session: Not on file  Stress:   . Feeling of Stress : Not on file  Social Connections:   . Frequency of Communication with Friends and Family: Not on file  . Frequency of Social Gatherings with Friends and Family: Not on file  . Attends Religious Services: Not on file  . Active Member of Clubs or Organizations: Not on file  . Attends Archivist Meetings: Not on file  . Marital Status: Not on file     Family History: The patient's family history is not on file.  ROS:   Review of Systems  Constitution: Negative for decreased appetite, fever and weight gain.  HENT: Negative for congestion, ear discharge, hoarse voice and sore throat.   Eyes: Negative for discharge, redness, vision loss in right eye and visual halos.  Cardiovascular: Negative for chest pain, dyspnea on exertion, leg swelling, orthopnea and palpitations.  Respiratory: Negative for cough, hemoptysis, shortness of breath and snoring.   Endocrine: Negative for heat intolerance and polyphagia.  Hematologic/Lymphatic: Negative for bleeding problem. Does not bruise/bleed easily.  Skin: Negative for flushing, nail changes, rash and suspicious lesions.  Musculoskeletal: Negative for arthritis, joint pain, muscle cramps, myalgias, neck pain and stiffness.  Gastrointestinal: Negative for abdominal pain, bowel incontinence, diarrhea and excessive appetite.  Genitourinary: Negative for decreased libido, genital sores and incomplete emptying.  Neurological: Negative for brief paralysis, focal weakness, headaches and loss of balance.  Psychiatric/Behavioral: Negative for altered mental status, depression and suicidal ideas.    Allergic/Immunologic: Negative for HIV exposure and persistent infections.    EKGs/Labs/Other Studies Reviewed:    The following studies were reviewed today:   EKG: None today  Coronary CTA IMPRESSION: 1. Coronary calcium score of 189. This was 28 percentile for age and sex matched control.  2. Normal Coronary origin with co-dominance.  3. Moderate obstructive CAD noted with lesions in the proximal and mid LAD (calcified plaque 50-69%). CADRADS-3. Study will be sent for FFR.  Recent Labs: 05/17/2019: BUN 26; Creatinine, Ser 0.86; Potassium 4.2; Sodium 141  Recent Lipid Panel No results found for: CHOL, TRIG, HDL, CHOLHDL, VLDL, LDLCALC, LDLDIRECT  Physical Exam:    VS:  BP 110/68   Pulse 88   Ht 5\' 7"  (1.702 m)   Wt 183 lb (83 kg)   SpO2 97%   BMI 28.66 kg/m     Wt Readings from Last 3 Encounters:  07/17/19 183 lb (83 kg)  05/11/19 179 lb (81.2 kg)  03/11/16 162 lb (73.5  kg)     GEN: Well nourished, well developed in no acute distress HEENT: Normal NECK: No JVD; No carotid bruits LYMPHATICS: No lymphadenopathy CARDIAC: S1S2 noted,RRR, no murmurs, rubs, gallops RESPIRATORY:  Clear to auscultation without rales, wheezing or rhonchi  ABDOMEN: Soft, non-tender, non-distended, +bowel sounds, no guarding. EXTREMITIES: No edema, No cyanosis, no clubbing MUSCULOSKELETAL:  No edema; No deformity  SKIN: Warm and dry NEUROLOGIC:  Alert and oriented x 3, non-focal PSYCHIATRIC:  Normal affect, good insight  ASSESSMENT:    1. Mixed hyperlipidemia   2. Coronary artery disease involving native coronary artery of native heart without angina pectoris   3. Essential hypertension    PLAN:    She recently underwent a cardiac CTA which showed moderate coronary artery disease.  I previously started her on aspirin 81 mg daily, she is on Zetia 10 mg along with a omega-3 krill oil.  She has declined statin therapy as she tells me that she does have substantial interaction to  this medication.  I do believe that she would be a good candidate for PCSK9 inhibitors therefore at this time going to start patient on Praluent 75 mg every 2 weeks.  Educated patient about this medication, all of her questions were answered.  Repeat lipid profile and lipoprotein a in 3 months.  The patient is in agreement with the above plan. The patient left the office in stable condition.  The patient will follow up in 3 months or sooner if needed.   Medication Adjustments/Labs and Tests Ordered: Current medicines are reviewed at length with the patient today.  Concerns regarding medicines are outlined above.  Orders Placed This Encounter  Procedures  . Lipid Profile  . Lipoprotein A (LPA)   Meds ordered this encounter  Medications  . clopidogrel (PLAVIX) 75 MG tablet    Sig: Take 1 tablet (75 mg total) by mouth daily.    Dispense:  90 tablet    Refill:  3  . DISCONTD: Alirocumab (PRALUENT) 75 MG/ML SOAJ    Sig: Inject 75 mg into the skin every 14 (fourteen) days.    Dispense:  6 pen    Refill:  1  . Evolocumab (REPATHA SURECLICK) [id number] MG/ML SOAJ    Sig: Inject 140 mg into the skin every 14 (fourteen) days.    Dispense:  2 pen    Refill:  5    Patient Instructions  Medication Instructions:  Your physician has recommended you make the following change in your medication:   START: Plavix(clopidogrel) 75 mg Take 1 tab daily STAR: Praluent 75 mg Inject 75 mg (1 pen) into the skin once every 14 days   *If you need a refill on your cardiac medications before your next appointment, please call your pharmacy*  Lab Work: BEFORE NEXT VISIT: Lipid,Liprprotein A  If you have labs (blood work) drawn today and your tests are completely normal, you will receive your results only by: [name] MyChart Message (if you have MyChart) OR . A paper copy in the mail If you have any lab test that is abnormal or we need to change your treatment, we will call you to review the  results.  Testing/Procedures: None  Follow-Up: At [facility], you and your health needs are our priority.  As part of our continuing mission to provide you with exceptional heart care, we have created designated Provider Care Teams.  These Care Teams include your primary Cardiologist (physician) and Advanced Practice Providers (APPs -  Physician Assistants and Nurse Practitioners)  who all work together to provide you with the care you need, when you need it.  Your next appointment:   3 month(s)  The format for your next appointment:   In Person  Provider:   [name], DO  Other Instructions Clopidogrel tablets What is this medicine? CLOPIDOGREL (kloh PID oh grel) helps to prevent blood clots. This medicine is used to prevent heart attack, stroke, or other vascular events in people who are at high risk. This medicine may be used for other purposes; ask your health care provider or pharmacist if you have questions. COMMON BRAND NAME(S): Plavix What should I tell my health care provider before I take this medicine? They need to know if you have any of the following conditions:  bleeding disorders  bleeding in the brain  having surgery  history of stomach bleeding  an unusual or allergic reaction to clopidogrel, other medicines, foods, dyes, or preservatives  pregnant or trying to get pregnant  breast-feeding How should I use this medicine? Take this medicine by mouth with a glass of water. Follow the directions on the prescription label. You may take this medicine with or without food. If it upsets your stomach, take it with food. Take your medicine at regular intervals. Do not take it more often than directed. Do not stop taking except on your doctor's advice. A special MedGuide will be given to you by the pharmacist with each prescription and refill. Be sure to read this information carefully each time. Talk to your pediatrician regarding the use of this medicine in  children. Special care may be needed. Overdosage: If you think you have taken too much of this medicine contact a poison control center or emergency room at once. NOTE: This medicine is only for you. Do not share this medicine with others. What if I miss a dose? If you miss a dose, take it as soon as you can. If it is almost time for your next dose, take only that dose. Do not take double or extra doses. What may interact with this medicine? Do not take this medicine with the following medications:  dasabuvir; ombitasvir; paritaprevir; ritonavir  defibrotide  selexipag This medicine may also interact with the following medications:  certain medicines that treat or prevent blood clots like warfarin  narcotic medicines for pain  NSAIDs, medicines for pain and inflammation, like ibuprofen or naproxen  repaglinide  SNRIs, medicines for depression, like desvenlafaxine, duloxetine, levomilnacipran, venlafaxine  SSRIs, medicines for depression, like citalopram, escitalopram, fluoxetine, fluvoxamine, paroxetine, sertraline  stomach acid blockers like cimetidine, esomeprazole, omeprazole This list may not describe all possible interactions. Give your health care provider a list of all the medicines, herbs, non-prescription drugs, or dietary supplements you use. Also tell them if you smoke, drink alcohol, or use illegal drugs. Some items may interact with your medicine. What should I watch for while using this medicine? Visit your doctor or health care professional for regular check-ups. Do not stop taking your medicine unless your doctor tells you to. Notify your doctor or health care professional and seek emergency treatment if you develop breathing problems; changes in vision; chest pain; severe, sudden headache; pain, swelling, warmth in the leg; trouble speaking; sudden numbness or weakness of the face, arm or leg. These can be signs that your condition has gotten worse. If you are going  to have surgery or dental work, tell your doctor or health care professional that you are taking this medicine. Certain genetic factors may reduce the  effect of this medicine. Your doctor may use genetic tests to determine treatment. Only take aspirin if you are instructed to. Low doses of aspirin are used with this medicine to treat some conditions. Taking aspirin with this medicine can increase your risk of bleeding so you must be careful. Talk to your doctor or pharmacist if you have questions. What side effects may I notice from receiving this medicine? Side effects that you should report to your doctor or health care professional as soon as possible:  allergic reactions like skin rash, itching or hives, swelling of the face, lips, or tongue  signs and symptoms of bleeding such as bloody or black, tarry stools; red or dark-brown urine; spitting up blood or brown material that looks like coffee grounds; red spots on the skin; unusual bruising or bleeding from the eye, gums, or nose  signs and symptoms of a blood clot such as breathing problems; changes in vision; chest pain; severe, sudden headache; pain, swelling, warmth in the leg; trouble speaking; sudden numbness or weakness of the face, arm or leg  signs and symptoms of low blood sugar such as feeling anxious; confusion; dizziness; increased hunger; unusually weak or tired; increased sweating; shakiness; cold, clammy skin; irritable; headache; blurred vision; fast heartbeat; loss of consciousness Side effects that usually do not require medical attention (report to your doctor or health care professional if they continue or are bothersome):  constipation  diarrhea  headache  upset stomach This list may not describe all possible side effects. Call your doctor for medical advice about side effects. You may report side effects to FDA at 1-800-FDA-1088. Where should I keep my medicine? Keep out of the reach of children. Store at room  temperature of 59 to 86 degrees F (15 to 30 degrees C). Throw away any unused medicine after the expiration date. NOTE: This sheet is a summary. It may not cover all possible information. If you have questions about this medicine, talk to your doctor, pharmacist, or health care provider.  2020 Elsevier/Gold Standard (2017-12-05 15:03:38) Alirocumab injection What is this medicine? ALIROCUMAB (al i ROC ue mab) is known as a PCSK9 inhibitor. It is used to lower the level of cholesterol in the blood. It may be used alone or in combination with other cholesterol-lowering drugs. This drug may also be used to reduce the risk of heart attack, stroke, and certain types of chest pain (unstable angina) that may need hospitalization. This medicine may be used for other purposes; ask your health care provider or pharmacist if you have questions. COMMON BRAND NAME(S): Praluent What should I tell my health care provider before I take this medicine? They need to know if you have any of these conditions:  any unusual or allergic reaction to alirocumab, other medicines, foods, dyes, or preservatives  pregnant or trying to get pregnant  breast-feeding How should I use this medicine? This medicine is for injection under the skin. You will be taught how to prepare and give this medicine. Use exactly as directed. Take your medicine at regular intervals. Do not take your medicine more often than directed. It is important that you put your used needles and syringes in a special sharps container. Do not put them in a trash can. If you do not have a sharps container, call your pharmacist or healthcare provider to get one. Talk to your pediatrician regarding the use of this medicine in children. Special care may be needed. Overdosage: If you think you have taken too much  of this medicine contact a poison control center or emergency room at once. NOTE: This medicine is only for you. Do not share this medicine with  others. What if I miss a dose? If you are on an every 2 week schedule and miss a dose, take it as soon as you can. If your next dose is to be taken in less than 7 days, then do not take the missed dose. Take the next dose at your regular time. Do not take double or extra doses. If you are on a monthly schedule and miss a dose, take it as soon as you can. If the dose given is within 7 days of the missed dose, continue with your regular monthly schedule. If the missed dose is administered after 7 days of the original date, administer the dose and start a new monthly schedule based on this date. What may interact with this medicine? Interactions are not expected. This list may not describe all possible interactions. Give your health care provider a list of all the medicines, herbs, non-prescription drugs, or dietary supplements you use. Also tell them if you smoke, drink alcohol, or use illegal drugs. Some items may interact with your medicine. What should I watch for while using this medicine? You may need blood work done while you are taking this medicine. What side effects may I notice from receiving this medicine? Side effects that you should report to your doctor or health care professional as soon as possible:  allergic reactions like skin rash, itching or hives, swelling of the face, lips, or tongue  signs and symptoms of infection like fever or chills; cough; sore throat; pain or trouble passing urine  signs and symptoms of liver injury like dark yellow or brown urine; general ill feeling or flu-like symptoms; light-colored stools; loss of appetite; nausea; right upper belly pain; unusually weak or tired; yellowing of the eyes or skin Side effects that usually do not require medical attention (report to your doctor or health care professional if they continue or are bothersome):  diarrhea  muscle cramps  muscle pain  pain, redness, or irritation at site where injected This list may not  describe all possible side effects. Call your doctor for medical advice about side effects. You may report side effects to FDA at 1-800-FDA-1088. Where should I keep my medicine? Keep out of the reach of children. You will be instructed on how to store this medicine. Throw away any unused medicine after the expiration date on the label. NOTE: This sheet is a summary. It may not cover all possible information. If you have questions about this medicine, talk to your doctor, pharmacist, or health care provider.  2020 Elsevier/Gold Standard (2017-11-17 22:02:52)      Adopting a Healthy Lifestyle.  Know what a healthy weight is for you (roughly BMI <25) and aim to maintain this   Aim for 7+ servings of fruits and vegetables daily   65-80+ fluid ounces of water or unsweet tea for healthy kidneys   Limit to max 1 drink of alcohol per day; avoid smoking/tobacco   Limit animal fats in diet for cholesterol and heart health - choose grass fed whenever available   Avoid highly processed foods, and foods high in saturated/trans fats   Aim for low stress - take time to unwind and care for your mental health   Aim for 150 min of moderate intensity exercise weekly for heart health, and weights twice weekly for bone health   Aim  for 7-9 hours of sleep daily   When it comes to diets, agreement about the perfect plan isnt easy to find, even among the experts. Experts at the [employer] developed an idea known as the Healthy Eating Plate. Just imagine a plate divided into logical, healthy portions.   The emphasis is on diet quality:   Load up on vegetables and fruits - one-half of your plate: Aim for color and variety, and remember that potatoes dont count.   Go for whole grains - one-quarter of your plate: Whole wheat, barley, wheat berries, quinoa, oats, brown rice, and foods made with them. If you want pasta, go with whole wheat pasta.   Protein power - one-quarter of  your plate: Fish, chicken, beans, and nuts are all healthy, versatile protein sources. Limit red meat.   The diet, however, does go beyond the plate, offering a few other suggestions.   Use healthy plant oils, such as olive, canola, soy, corn, sunflower and peanut. Check the labels, and avoid partially hydrogenated oil, which have unhealthy trans fats.   If youre thirsty, drink water. Coffee and tea are good in moderation, but skip sugary drinks and limit milk and dairy products to one or two daily servings.   The type of carbohydrate in the diet is more important than the amount. Some sources of carbohydrates, such as vegetables, fruits, whole grains, and beans-are healthier than others.   Finally, stay active  Signed, [name], DO  07/18/2019 6:38 PM    [facility]

## 2019-07-18 MED ORDER — REPATHA SURECLICK 140 MG/ML ~~LOC~~ SOAJ: 140.0000 mg | SUBCUTANEOUS | 5 refills | Status: DC

## 2019-07-18 NOTE — Addendum Note (Signed)
Addended by: Particia Nearing B on: 07/18/2019 08:19 AM   Modules accepted: Orders

## 2019-07-19 DIAGNOSIS — H401111 Primary open-angle glaucoma, right eye, mild stage: Secondary | ICD-10-CM | POA: Diagnosis not present

## 2019-07-19 DIAGNOSIS — H401123 Primary open-angle glaucoma, left eye, severe stage: Secondary | ICD-10-CM | POA: Diagnosis not present

## 2019-07-24 DIAGNOSIS — L299 Pruritus, unspecified: Secondary | ICD-10-CM | POA: Diagnosis not present

## 2019-07-24 DIAGNOSIS — D485 Neoplasm of uncertain behavior of skin: Secondary | ICD-10-CM | POA: Diagnosis not present

## 2019-07-24 DIAGNOSIS — L570 Actinic keratosis: Secondary | ICD-10-CM | POA: Diagnosis not present

## 2019-07-24 DIAGNOSIS — L300 Nummular dermatitis: Secondary | ICD-10-CM | POA: Diagnosis not present

## 2019-07-31 DIAGNOSIS — R0981 Nasal congestion: Secondary | ICD-10-CM | POA: Diagnosis not present

## 2019-08-07 DIAGNOSIS — L219 Seborrheic dermatitis, unspecified: Secondary | ICD-10-CM | POA: Diagnosis not present

## 2019-08-07 DIAGNOSIS — L728 Other follicular cysts of the skin and subcutaneous tissue: Secondary | ICD-10-CM | POA: Diagnosis not present

## 2019-09-17 DIAGNOSIS — N3000 Acute cystitis without hematuria: Secondary | ICD-10-CM | POA: Diagnosis not present

## 2019-09-17 DIAGNOSIS — N951 Menopausal and female climacteric states: Secondary | ICD-10-CM | POA: Diagnosis not present

## 2019-09-18 DIAGNOSIS — L299 Pruritus, unspecified: Secondary | ICD-10-CM | POA: Diagnosis not present

## 2019-09-18 DIAGNOSIS — L219 Seborrheic dermatitis, unspecified: Secondary | ICD-10-CM | POA: Diagnosis not present

## 2019-09-18 DIAGNOSIS — L300 Nummular dermatitis: Secondary | ICD-10-CM | POA: Diagnosis not present

## 2019-10-22 DIAGNOSIS — N3000 Acute cystitis without hematuria: Secondary | ICD-10-CM | POA: Diagnosis not present

## 2019-10-22 DIAGNOSIS — Z79899 Other long term (current) drug therapy: Secondary | ICD-10-CM | POA: Diagnosis not present

## 2019-10-22 DIAGNOSIS — E039 Hypothyroidism, unspecified: Secondary | ICD-10-CM | POA: Diagnosis not present

## 2019-10-22 DIAGNOSIS — I2510 Atherosclerotic heart disease of native coronary artery without angina pectoris: Secondary | ICD-10-CM | POA: Diagnosis not present

## 2019-10-22 DIAGNOSIS — E7800 Pure hypercholesterolemia, unspecified: Secondary | ICD-10-CM | POA: Diagnosis not present

## 2019-10-24 ENCOUNTER — Ambulatory Visit: Payer: Medicare Other | Admitting: Cardiology

## 2019-10-24 ENCOUNTER — Encounter: Payer: Self-pay | Admitting: Cardiology

## 2019-10-24 ENCOUNTER — Other Ambulatory Visit: Payer: Self-pay

## 2019-10-24 VITALS — BP 104/54 | HR 71 | Ht 67.0 in | Wt 181.0 lb

## 2019-10-24 DIAGNOSIS — E782 Mixed hyperlipidemia: Secondary | ICD-10-CM | POA: Diagnosis not present

## 2019-10-24 DIAGNOSIS — I2510 Atherosclerotic heart disease of native coronary artery without angina pectoris: Secondary | ICD-10-CM

## 2019-10-24 DIAGNOSIS — I10 Essential (primary) hypertension: Secondary | ICD-10-CM | POA: Diagnosis not present

## 2019-10-24 NOTE — Progress Notes (Signed)
Cardiology Office Note:    Date:  10/24/2019   ID:  [name], DOB 1941-07-08, MRN [id number]  PCP:  Stacey Grammes, MD  Cardiologist:  Stacey Salines, DO  Electrophysiologist:  None   Referring MD: Stacey Grammes, MD   Chief Complaint  Patient presents with  . Follow-up   " I have a lot going on at home, my husband is now in hospice."  History of Present Illness:    [name] is a 79 y.o. female with a hx of moderate coronary artery disease, hypertension, hyperlipidemia presents for follow-up visit.  No complaints today.  But is inquiring if she Kirby get off Repatha due to cost.  Past Medical History:  Diagnosis Date  . Arthritis   . Hyperlipemia   . Hypertension   . Hypothyroidism   . Osteoarthritis of left knee 09/20/2017  . PONV (postoperative nausea and vomiting)    "years ago"  . Primary osteoarthritis of right knee 03/11/2016  . Wears glasses     Past Surgical History:  Procedure Laterality Date  . ABDOMINAL HYSTERECTOMY  1978  . ACHILLES TENDON SURGERY Right 06/27/2014   Procedure: RIGHT ACHILLES DEBRIDEMENT AND REPAIR WITH TRANSFER OF FLEXOR HALLUCIS LONGUS TENDON TO THE CALCANEUS  ;  Surgeon: Stacey Simmer, MD;  Location: [facility];  Service: Orthopedics;  Laterality: Right;  . ACHILLES TENDON SURGERY Left 11/28/2014   Procedure: LEFT ACHILLES DEBRIDEMENT AND REPAIR/GASTROC RECESSION;  Surgeon: Stacey Simmer, MD;  Location: [facility];  Service: Orthopedics;  Laterality: Left;  . COLONOSCOPY    . EYE SURGERY  2014,2015   cataracts-both  . GASTROC RECESSION EXTREMITY Left 11/28/2014   Procedure: GASTROC RECESSION EXTREMITY;  Surgeon: Stacey Simmer, MD;  Location: [facility];  Service: Orthopedics;  Laterality: Left;  . KNEE ARTHROPLASTY Right 03/11/2016   Procedure: RIGHT TOTAL KNEE ARTHROPLASTY WITH NAVIGATION;  Surgeon: Stacey Can, MD;  Location: [facility];  Service: Orthopedics;  Laterality: Right;  .  TONSILLECTOMY      Current Medications: Current Meds  Medication Sig  . ascorbic acid (VITAMIN C) 500 MG tablet Take 500 mg by mouth daily.  [name] aspirin 81 MG chewable tablet Chew 1 tablet (81 mg total) by mouth 2 (two) times daily.  . calcium carbonate (OS-CAL) 600 MG TABS tablet Take 600 mg by mouth 2 (two) times daily with a meal.  . Cholecalciferol (VITAMIN D) 125 MCG (5000 UT) CAPS Take by mouth daily.  . clobetasol cream (TEMOVATE) 0.05 %   . Coenzyme Q10 (CO Q 10) 100 MG CAPS Take 100 mg by mouth daily.  . diclofenac (VOLTAREN) 75 MG EC tablet Take 75 mg by mouth 2 (two) times daily as needed.   [name] esomeprazole (NEXIUM) 20 MG capsule Take 20 mg by mouth daily at 12 noon.  . Evolocumab (REPATHA SURECLICK) [id number] MG/ML SOAJ Inject 140 mg into the skin every 14 (fourteen) days.  [name] ezetimibe (ZETIA) 10 MG tablet 10 mg daily.  [name] ketoconazole (NIZORAL) 2 % shampoo Apply 1 application topically 2 (two) times a week.  . latanoprost (XALATAN) 0.005 % ophthalmic solution   . levothyroxine (SYNTHROID, LEVOTHROID) 100 MCG tablet Take 100 mcg by mouth daily before breakfast.  . lisinopril-hydrochlorothiazide (ZESTORETIC) 10-12.5 MG tablet lisinopril 10 mg-hydrochlorothiazide 12.5 mg tablet  . MegaRed Omega-3 Krill Oil 500 MG CAPS Take by mouth.  . nitroGLYCERIN (NITROSTAT) 0.4 MG SL tablet Place 1 tablet (0.4 mg total) under the tongue every 5 (five) minutes  as needed for chest pain.  . [DISCONTINUED] clopidogrel (PLAVIX) 75 MG tablet Take 1 tablet (75 mg total) by mouth daily.     Allergies:   Patient has no known allergies.   Social History   Socioeconomic History  . Marital status: Married    Spouse name: Not on file  . Number of children: Not on file  . Years of education: Not on file  . Highest education level: Not on file  Occupational History  . Not on file  Tobacco Use  . Smoking status: Never Smoker  . Smokeless tobacco: Never Used  Substance and Sexual Activity  . Alcohol use:  No  . Drug use: No  . Sexual activity: Not on file  Other Topics Concern  . Not on file  Social History Narrative  . Not on file   Social Determinants of Health   Financial Resource Strain:   . Difficulty of Paying Living Expenses:   Food Insecurity:   . Worried About Charity fundraiser in the Last Year:   . Arboriculturist in the Last Year:   Transportation Needs:   . Film/video editor (Medical):   [name] Lack of Transportation (Non-Medical):   Physical Activity:   . Days of Exercise per Week:   . Minutes of Exercise per Session:   Stress:   . Feeling of Stress :   Social Connections:   . Frequency of Communication with Friends and Family:   . Frequency of Social Gatherings with Friends and Family:   . Attends Religious Services:   . Active Member of Clubs or Organizations:   . Attends Archivist Meetings:   [name] Marital Status:      Family History: The patient's family history is not on file.  ROS:   Review of Systems  Constitution: Negative for decreased appetite, fever and weight gain.  HENT: Negative for congestion, ear discharge, hoarse voice and sore throat.   Eyes: Negative for discharge, redness, vision loss in right eye and visual halos.  Cardiovascular: Negative for chest pain, dyspnea on exertion, leg swelling, orthopnea and palpitations.  Respiratory: Negative for cough, hemoptysis, shortness of breath and snoring.   Endocrine: Negative for heat intolerance and polyphagia.  Hematologic/Lymphatic: Negative for bleeding problem. Does not bruise/bleed easily.  Skin: Negative for flushing, nail changes, rash and suspicious lesions.  Musculoskeletal: Negative for arthritis, joint pain, muscle cramps, myalgias, neck pain and stiffness.  Gastrointestinal: Negative for abdominal pain, bowel incontinence, diarrhea and excessive appetite.  Genitourinary: Negative for decreased libido, genital sores and incomplete emptying.  Neurological: Negative for brief  paralysis, focal weakness, headaches and loss of balance.  Psychiatric/Behavioral: Negative for altered mental status, depression and suicidal ideas.  Allergic/Immunologic: Negative for HIV exposure and persistent infections.    EKGs/Labs/Other Studies Reviewed:    The following studies were reviewed today:   EKG:  None today  TTE IMPRESSIONS 12/22020 1. Left ventricular ejection fraction, by visual estimation, is 60 to  65%. The left ventricle has normal function. Left ventricular septal wall  thickness was mildly increased. Mildly increased left ventricular  posterior wall thickness. There is mildly  increased left ventricular hypertrophy.  2. Elevated left ventricular end-diastolic pressure.  3. Left ventricular diastolic parameters are consistent with Grade I  diastolic dysfunction (impaired relaxation).  4. The left ventricle has no regional wall motion abnormalities.  5. Global right ventricle has normal systolic function.The right  ventricular size is normal. No increase in right ventricular wall  thickness.  6. Left atrial size was normal.  7. Right atrial size was normal.  8. The mitral valve is normal in structure. No evidence of mitral valve  regurgitation. No evidence of mitral stenosis.  9. The tricuspid valve is normal in structure. Tricuspid valve  regurgitation is trivial.  10. The aortic valve is tricuspid. Aortic valve regurgitation is mild.  Mild aortic valve sclerosis without stenosis.  11. The pulmonic valve was normal in structure. Pulmonic valve  regurgitation is not visualized.  12. Normal pulmonary artery systolic pressure.  13. The inferior vena cava is normal in size with greater than 50%  respiratory variability, suggesting right atrial pressure of 3 mmHg.    CCTAAorta: Normal size. Calcification noted at the aortic root. There is a focal calcification at adjacent the ostial of the RCA. No dissection.  Aortic Valve:  Trileaflet.  No  calcifications.  Coronary Arteries:  Normal coronary origin.  RCA is a small caliber co-dominant vessel, gives rise to PDA and PLVB. There is minimal (<25%) luminal irregularities in the proximal portion of the RCA.  Left main is a large artery that gives rise to LAD and LCX arteries.  LAD is a large vessel with three Diagonal branches. There is a moderate (50-69%) lesion (calcified plaque) in the proximal LAD. There is a second lesion (calcified plaque) in the mid LAD, this lesion appears to be moderate (50-69%).  LCX is a co-dominant artery that gives rise to one large OM1 branch. There is no plaque.  Other findings:  Normal pulmonary vein drainage into the left atrium with no evidence of stenosis.  Normal let atrial appendage without a thrombus.  Normal size of the pulmonary artery.  IMPRESSION: 1. Coronary calcium score of 189. This was 94 percentile for age and sex matched control.  2. Normal Coronary origin with co-dominance.  3. Moderate obstructive CAD noted with lesions in the proximal and mid LAD (calcified plaque 50-69%). CADRADS-3. Study will be sent forFFR.  FFRCT Left Main:0.98  LAD: Proximal: 0.92, Mid: 0.90 Distal 0.90  LCX: Proximal: 0.97, Mid 0.94, Distal: 0.89  RCA: 0.97  IMPRESSION: 1. CT FFR analysis didn't show any significant stenosis. Therefore aggressive medical therapy is recommended.  Recent Labs: 05/17/2019: BUN 26; Creatinine, Ser 0.86; Potassium 4.2; Sodium 141  Recent Lipid Panel No results found for: CHOL, TRIG, HDL, CHOLHDL, VLDL, LDLCALC, LDLDIRECT  Physical Exam:    VS:  BP (!) 104/54 (BP Location: Left Arm, Patient Position: Sitting, Cuff Size: Normal)   Pulse 71   Ht 5\' 7"  (1.702 m)   Wt 181 lb (82.1 kg)   SpO2 99%   BMI 28.35 kg/m     Wt Readings from Last 3 Encounters:  10/24/19 181 lb (82.1 kg)  07/17/19 183 lb (83 kg)  05/11/19 179 lb (81.2 kg)     GEN: Well nourished, well developed in no  acute distress HEENT: Normal NECK: No JVD; No carotid bruits LYMPHATICS: No lymphadenopathy CARDIAC: S1S2 noted,RRR, no murmurs, rubs, gallops RESPIRATORY:  Clear to auscultation without rales, wheezing or rhonchi  ABDOMEN: Soft, non-tender, non-distended, +bowel sounds, no guarding. EXTREMITIES: No edema, No cyanosis, no clubbing MUSCULOSKELETAL:  No deformity  SKIN: Warm and dry NEUROLOGIC:  Alert and oriented x 3, non-focal PSYCHIATRIC:  Normal affect, good insight  ASSESSMENT:    1. Coronary artery disease involving native coronary artery of native heart without angina pectoris   2. Essential hypertension   3. Mixed hyperlipidemia    PLAN:  1.  Coronary artery disease-we will continue patient on aspirin 81 mg daily, plan going to stop Plavix at this time.  He is currently on Repatha along with Zetia.  She wishes to be taken off [place] and wants to try statins again.  This is due to cost.  She recently had follow-up on Monday with her PCP is results are not available yet, we decided to wait until the blood work is out and we will discuss further if she should remain on [place].  She is agreeable for this at this time.  2.  Hypertension-blood pressure at the goal in the office today.  3.  Hyperlipidemia-continue Repatha and Zetia for now.  The patient expresses interest in stopping Repatha due to cost.  We have discussed waiting for the blood work and re-evaluate this.  The patient is in agreement with the above plan. The patient left the office in stable condition.  The patient will follow up in 6 months or sooner if needed.   Medication Adjustments/Labs and Tests Ordered: Current medicines are reviewed at length with the patient today.  Concerns regarding medicines are outlined above.  No orders of the defined types were placed in this encounter.  No orders of the defined types were placed in this encounter.   Patient Instructions  Medication Instructions:  Your  physician has recommended you make the following change in your medication:  1. STOP Plavix  *If you need a refill on your cardiac medications before your next appointment, please call your pharmacy*   Lab Work: None ordered    Testing/Procedures: None ordered   Follow-Up: At [facility], you and your health needs are our priority.  As part of our continuing mission to provide you with exceptional heart care, we have created designated Provider Care Teams.  These Care Teams include your primary Cardiologist (physician) and Advanced Practice Providers (APPs -  Physician Assistants and Nurse Practitioners) who all work together to provide you with the care you need, when you need it.  We recommend signing up for the patient portal called "MyChart".  Sign up information is provided on this After Visit Summary.  MyChart is used to connect with patients for Virtual Visits (Telemedicine).  Patients are able to view lab/test results, encounter notes, upcoming appointments, etc.  Non-urgent messages Kirby be sent to your provider as well.   To learn more about what you can do with MyChart, go to [email or web address].    Your next appointment:   6 month(s)  The format for your next appointment:   In Person  Provider:   [name], DO   Other Instructions      Adopting a Healthy Lifestyle.  Know what a healthy weight is for you (roughly BMI <25) and aim to maintain this   Aim for 7+ servings of fruits and vegetables daily   65-80+ fluid ounces of water or unsweet tea for healthy kidneys   Limit to max 1 drink of alcohol per day; avoid smoking/tobacco   Limit animal fats in diet for cholesterol and heart health - choose grass fed whenever available   Avoid highly processed foods, and foods high in saturated/trans fats   Aim for low stress - take time to unwind and care for your mental health   Aim for 150 min of moderate intensity exercise weekly for heart health, and  weights twice weekly for bone health   Aim for 7-9 hours of sleep daily   When it comes to diets, agreement  about the perfect plan isnt easy to find, even among the experts. Experts at the [employer] developed an idea known as the Healthy Eating Plate. Just imagine a plate divided into logical, healthy portions.   The emphasis is on diet quality:   Load up on vegetables and fruits - one-half of your plate: Aim for color and variety, and remember that potatoes dont count.   Go for whole grains - one-quarter of your plate: Whole wheat, barley, wheat berries, quinoa, oats, brown rice, and foods made with them. If you want pasta, go with whole wheat pasta.   Protein power - one-quarter of your plate: Fish, chicken, beans, and nuts are all healthy, versatile protein sources. Limit red meat.   The diet, however, does go beyond the plate, offering a few other suggestions.   Use healthy plant oils, such as olive, canola, soy, corn, sunflower and peanut. Check the labels, and avoid partially hydrogenated oil, which have unhealthy trans fats.   If youre thirsty, drink water. Coffee and tea are good in moderation, but skip sugary drinks and limit milk and dairy products to one or two daily servings.   The type of carbohydrate in the diet is more important than the amount. Some sources of carbohydrates, such as vegetables, fruits, whole grains, and beans-are healthier than others.   Finally, stay active  Signed, Stacey Salines, DO  10/24/2019 11:56 AM    [place]

## 2019-10-24 NOTE — Patient Instructions (Signed)
Medication Instructions:  Your physician has recommended you make the following change in your medication:  1. STOP Plavix  *If you need a refill on your cardiac medications before your next appointment, please call your pharmacy*   Lab Work: None ordered    Testing/Procedures: None ordered   Follow-Up: At [facility], you and your health needs are our priority.  As part of our continuing mission to provide you with exceptional heart care, we have created designated Provider Care Teams.  These Care Teams include your primary Cardiologist (physician) and Advanced Practice Providers (APPs -  Physician Assistants and Nurse Practitioners) who all work together to provide you with the care you need, when you need it.  We recommend signing up for the patient portal called "MyChart".  Sign up information is provided on this After Visit Summary.  MyChart is used to connect with patients for Virtual Visits (Telemedicine).  Patients are able to view lab/test results, encounter notes, upcoming appointments, etc.  Non-urgent messages can be sent to your provider as well.   To learn more about what you can do with MyChart, go to [email or web address].    Your next appointment:   6 month(s)  The format for your next appointment:   In Person  Provider:   [name], DO   Other Instructions

## 2020-01-07 DIAGNOSIS — N3001 Acute cystitis with hematuria: Secondary | ICD-10-CM | POA: Diagnosis not present

## 2020-01-07 DIAGNOSIS — N3090 Cystitis, unspecified without hematuria: Secondary | ICD-10-CM | POA: Diagnosis not present

## 2020-01-08 ENCOUNTER — Telehealth: Payer: Self-pay

## 2020-01-08 NOTE — Telephone Encounter (Signed)
Prior authorization has been obtained through cover my meds for patients Repatha sure click.  Pharmacy was informed of the approval of the prior authorization.

## 2020-04-25 ENCOUNTER — Ambulatory Visit (INDEPENDENT_AMBULATORY_CARE_PROVIDER_SITE_OTHER): Payer: Medicare Other | Admitting: Cardiology

## 2020-04-25 ENCOUNTER — Encounter: Payer: Self-pay | Admitting: Cardiology

## 2020-04-25 ENCOUNTER — Other Ambulatory Visit: Payer: Self-pay

## 2020-04-25 VITALS — BP 110/62 | HR 85 | Ht 67.0 in | Wt 178.6 lb

## 2020-04-25 DIAGNOSIS — I10 Essential (primary) hypertension: Secondary | ICD-10-CM

## 2020-04-25 DIAGNOSIS — Z23 Encounter for immunization: Secondary | ICD-10-CM | POA: Diagnosis not present

## 2020-04-25 DIAGNOSIS — I2510 Atherosclerotic heart disease of native coronary artery without angina pectoris: Secondary | ICD-10-CM

## 2020-04-25 DIAGNOSIS — E782 Mixed hyperlipidemia: Secondary | ICD-10-CM | POA: Diagnosis not present

## 2020-04-25 MED ORDER — RANOLAZINE ER 500 MG PO TB12: 500.0000 mg | ORAL_TABLET | Freq: Two times a day (BID) | ORAL | 3 refills | Status: DC

## 2020-04-25 NOTE — Patient Instructions (Signed)
Medication Instructions:  Your physician has recommended you make the following change in your medication:  START: Ranexa 500 mg take one tablet by mouth twice daily.  *If you need a refill on your cardiac medications before your next appointment, please call your pharmacy*   Lab Work: None If you have labs (blood work) drawn today and your tests are completely normal, you will receive your results only by: . MyChart Message (if you have MyChart) OR . A paper copy in the mail If you have any lab test that is abnormal or we need to change your treatment, we will call you to review the results.   Testing/Procedures: None   Follow-Up: At [facility], you and your health needs are our priority.  As part of our continuing mission to provide you with exceptional heart care, we have created designated Provider Care Teams.  These Care Teams include your primary Cardiologist (physician) and Advanced Practice Providers (APPs -  Physician Assistants and Nurse Practitioners) who all work together to provide you with the care you need, when you need it.  We recommend signing up for the patient portal called "MyChart".  Sign up information is provided on this After Visit Summary.  MyChart is used to connect with patients for Virtual Visits (Telemedicine).  Patients are able to view lab/test results, encounter notes, upcoming appointments, etc.  Non-urgent messages can be sent to your provider as well.   To learn more about what you can do with MyChart, go to [email or web address].    Your next appointment:   3 month(s)  The format for your next appointment:   In Person  Provider:   Kardie Tobb, DO   Other Instructions   

## 2020-04-25 NOTE — Progress Notes (Signed)
Cardiology Office Note:    Date:  04/25/2020   ID:  [name], DOB [birth year], MRN [id number]  PCP:  [name], MD  Cardiologist:  [name], DO  Electrophysiologist:  None   Referring MD: [name], MD   " I am doing well"  History of Present Illness:    [name] is a 79 y.o. female with a hx of moderate coronary artery disease, hypertension, hyperlipidemia presents for follow-up visit.  I saw the patient April 20, at that time she appeared to be doing well from a cardiovascular standpoint.  I stopped the Plavix.  Since I last saw the patient she has lost her husband back in April.  She tells me also recently she started doing more exercising.  She feels as if she is experiencing shortness of breath on exertion.  She denies any chest pain.  Past Medical History:  Diagnosis Date  . Arthritis   . Hyperlipemia   . Hypertension   . Hypothyroidism   . Osteoarthritis of left knee 09/20/2017  . PONV (postoperative nausea and vomiting)    "years ago"  . Primary osteoarthritis of right knee 03/11/2016  . Wears glasses     Past Surgical History:  Procedure Laterality Date  . ABDOMINAL HYSTERECTOMY  1978  . ACHILLES TENDON SURGERY Right 06/27/2014   Procedure: RIGHT ACHILLES DEBRIDEMENT AND REPAIR WITH TRANSFER OF FLEXOR HALLUCIS LONGUS TENDON TO THE CALCANEUS  ;  Surgeon: [name], MD;  Location: [facility];  Service: Orthopedics;  Laterality: Right;  . ACHILLES TENDON SURGERY Left 11/28/2014   Procedure: LEFT ACHILLES DEBRIDEMENT AND REPAIR/GASTROC RECESSION;  Surgeon: [name], MD;  Location: [facility];  Service: Orthopedics;  Laterality: Left;  . COLONOSCOPY    . EYE SURGERY  2014,2015   cataracts-both  . GASTROC RECESSION EXTREMITY Left 11/28/2014   Procedure: GASTROC RECESSION EXTREMITY;  Surgeon: [name], MD;  Location: [facility];  Service: Orthopedics;  Laterality: Left;  . KNEE  ARTHROPLASTY Right 03/11/2016   Procedure: RIGHT TOTAL KNEE ARTHROPLASTY WITH NAVIGATION;  Surgeon: Rod Can, MD;  Location: [facility];  Service: Orthopedics;  Laterality: Right;  . TONSILLECTOMY      Current Medications: Current Meds  Medication Sig  . aspirin 81 MG chewable tablet Chew 1 tablet (81 mg total) by mouth 2 (two) times daily.  . calcium carbonate (OS-CAL) 600 MG TABS tablet Take 600 mg by mouth 2 (two) times daily with a meal.  . Cholecalciferol (VITAMIN D) 125 MCG (5000 UT) CAPS Take by mouth daily.  . clobetasol cream (TEMOVATE) 0.05 %   . diclofenac (VOLTAREN) 75 MG EC tablet Take 75 mg by mouth 2 (two) times daily as needed.   [name] esomeprazole (NEXIUM) 20 MG capsule Take 20 mg by mouth daily at 12 noon.  . Evolocumab (REPATHA SURECLICK) 937 MG/ML SOAJ Inject 140 mg into the skin every 14 (fourteen) days.  [name] ezetimibe (ZETIA) 10 MG tablet 10 mg daily.  [name] ketoconazole (NIZORAL) 2 % shampoo Apply 1 application topically 2 (two) times a week.  . levocetirizine (XYZAL) 5 MG tablet Take 5 mg by mouth every evening.  [name] levothyroxine (SYNTHROID, LEVOTHROID) 100 MCG tablet Take 100 mcg by mouth daily before breakfast.  . lisinopril-hydrochlorothiazide (ZESTORETIC) 10-12.5 MG tablet lisinopril 10 mg-hydrochlorothiazide 12.5 mg tablet  . MegaRed Omega-3 Krill Oil 500 MG CAPS Take by mouth.     Allergies:   Patient has no known allergies.   Social  History   Socioeconomic History  . Marital status: Married    Spouse name: Not on file  . Number of children: Not on file  . Years of education: Not on file  . Highest education level: Not on file  Occupational History  . Not on file  Tobacco Use  . Smoking status: Never Smoker  . Smokeless tobacco: Never Used  Substance and Sexual Activity  . Alcohol use: No  . Drug use: No  . Sexual activity: Not on file  Other Topics Concern  . Not on file  Social History Narrative  . Not on file   Social Determinants of Health    Financial Resource Strain:   . Difficulty of Paying Living Expenses: Not on file  Food Insecurity:   . Worried About Charity fundraiser in the Last Year: Not on file  . Ran Out of Food in the Last Year: Not on file  Transportation Needs:   . Lack of Transportation (Medical): Not on file  . Lack of Transportation (Non-Medical): Not on file  Physical Activity:   . Days of Exercise per Week: Not on file  . Minutes of Exercise per Session: Not on file  Stress:   . Feeling of Stress : Not on file  Social Connections:   . Frequency of Communication with Friends and Family: Not on file  . Frequency of Social Gatherings with Friends and Family: Not on file  . Attends Religious Services: Not on file  . Active Member of Clubs or Organizations: Not on file  . Attends Archivist Meetings: Not on file  . Marital Status: Not on file     Family History: The patient's family history is not on file.  ROS:   Review of Systems  Constitution: Negative for decreased appetite, fever and weight gain.  HENT: Negative for congestion, ear discharge, hoarse voice and sore throat.   Eyes: Negative for discharge, redness, vision loss in right eye and visual halos.  Cardiovascular: Negative for chest pain, dyspnea on exertion, leg swelling, orthopnea and palpitations.  Respiratory: Negative for cough, hemoptysis, shortness of breath and snoring.   Endocrine: Negative for heat intolerance and polyphagia.  Hematologic/Lymphatic: Negative for bleeding problem. Does not bruise/bleed easily.  Skin: Negative for flushing, nail changes, rash and suspicious lesions.  Musculoskeletal: Negative for arthritis, joint pain, muscle cramps, myalgias, neck pain and stiffness.  Gastrointestinal: Negative for abdominal pain, bowel incontinence, diarrhea and excessive appetite.  Genitourinary: Negative for decreased libido, genital sores and incomplete emptying.  Neurological: Negative for brief paralysis, focal  weakness, headaches and loss of balance.  Psychiatric/Behavioral: Negative for altered mental status, depression and suicidal ideas.  Allergic/Immunologic: Negative for HIV exposure and persistent infections.    EKGs/Labs/Other Studies Reviewed:    The following studies were reviewed today:   EKG:  The ekg ordered today demonstrates sinus rhythm, heart rate 85 bpm with nonspecific ST changes no significant change compared to prior.  TTE IMPRESSIONS 12/22020 1. Left ventricular ejection fraction, by visual estimation, is 60 to 65%. The left ventricle has normal function. Left ventricular septal wall thickness was mildly increased. Mildly increased left ventricular posterior wall thickness. There is mildly increased left ventricular hypertrophy.  2. Elevated left ventricular end-diastolic pressure.  3. Left ventricular diastolic parameters are consistent with Grade I diastolic dysfunction (impaired relaxation).  4. The left ventricle has no regional wall motion abnormalities.  5. Global right ventricle has normal systolic function.The right ventricular size is normal. No increase  in right ventricular wall thickness.  6. Left atrial size was normal.  7. Right atrial size was normal.  8. The mitral valve is normal in structure. No evidence of mitral valve regurgitation. No evidence of mitral stenosis.  9. The tricuspid valve is normal in structure. Tricuspid valve regurgitation is trivial.  10. The aortic valve is tricuspid. Aortic valve regurgitation is mild. Mild aortic valve sclerosis without stenosis.  11. The pulmonic valve was normal in structure. Pulmonic valve regurgitation is not visualized.  12. Normal pulmonary artery systolic pressure.  13. The inferior vena cava is normal in size with greater than 50% respiratory variability, suggesting right atrial pressure of 3 mmHg.    CCTAAorta: Normal size. Calcification noted at the aortic root. There is a focal calcification at  adjacent the ostial of the RCA. No dissection.  Aortic Valve: Trileaflet. No calcifications.  Coronary Arteries: Normal coronary origin.  RCA is a small caliber co-dominant vessel, gives rise to PDA and PLVB. There is minimal (<25%) luminal irregularities in the proximal portion of the RCA.  Left main is a large artery that gives rise to LAD and LCX arteries.  LAD is a large vessel with three Diagonal branches. There is a moderate (50-69%) lesion (calcified plaque) in the proximal LAD. There is a second lesion (calcified plaque) in the mid LAD, this lesion appears to be moderate (50-69%).  LCX is a co-dominant artery that gives rise to one large OM1 branch. There is no plaque.  Other findings:  Normal pulmonary vein drainage into the left atrium with no evidence of stenosis.  Normal let atrial appendage without a thrombus.  Normal size of the pulmonary artery.  IMPRESSION: 1. Coronary calcium score of 189. This was 44 percentile for age and sex matched control.  2. Normal Coronary origin with co-dominance.  3. Moderate obstructive CAD noted with lesions in the proximal and mid LAD (calcified plaque 50-69%). CADRADS-3. Study will be sent forFFR.  FFRCT Left Main:0.98  LAD: Proximal: 0.92, Mid: 0.90 Distal 0.90  LCX: Proximal: 0.97, Mid 0.94, Distal: 0.89  RCA: 0.97  IMPRESSION: 1. CT FFR analysis didn't show any significant stenosis. Therefore aggressive medical therapy is recommended  Recent Labs: 05/17/2019: BUN 26; Creatinine, Ser 0.86; Potassium 4.2; Sodium 141  Recent Lipid Panel No results found for: CHOL, TRIG, HDL, CHOLHDL, VLDL, LDLCALC, LDLDIRECT  Physical Exam:    VS:  BP 110/62 (BP Location: Left Arm, Patient Position: Sitting, Cuff Size: Normal)   Pulse 85   Ht 5\' 7"  (1.702 m)   Wt 178 lb 9.6 oz (81 kg)   SpO2 94%   BMI 27.97 kg/m     Wt Readings from Last 3 Encounters:  04/25/20 178 lb 9.6 oz (81 kg)  10/24/19 181 lb  (82.1 kg)  07/17/19 183 lb (83 kg)     GEN: Well nourished, well developed in no acute distress HEENT: Normal NECK: No JVD; No carotid bruits LYMPHATICS: No lymphadenopathy CARDIAC: S1S2 noted,RRR, no murmurs, rubs, gallops RESPIRATORY:  Clear to auscultation without rales, wheezing or rhonchi  ABDOMEN: Soft, non-tender, non-distended, +bowel sounds, no guarding. EXTREMITIES: No edema, No cyanosis, no clubbing MUSCULOSKELETAL:  No deformity  SKIN: Warm and dry NEUROLOGIC:  Alert and oriented x 3, non-focal PSYCHIATRIC:  Normal affect, good insight  ASSESSMENT:    1. Primary hypertension   2. Coronary artery disease involving native coronary artery of native heart without angina pectoris   3. Mixed hyperlipidemia   4. Need for immunization against influenza  PLAN:    I am worried that her shortness of breath could be anginal equivalent therefore I will like to optimize her antianginals with adding Ranexa 500 mg twice a day to her regimen. Her blood pressure is acceptable in the office today no changes will be made to the antihypertensive medication regimen.  Continue aspirin, Repatha and zetia.   The patient is in agreement with the above plan. The patient left the office in stable condition.  The patient will follow up in  3 months.   Medication Adjustments/Labs and Tests Ordered: Current medicines are reviewed at length with the patient today.  Concerns regarding medicines are outlined above.  Orders Placed This Encounter  Procedures  . Flu Vaccine QUAD High Dose(Fluad)   Meds ordered this encounter  Medications  . ranolazine (RANEXA) 500 MG 12 hr tablet    Sig: Take 1 tablet (500 mg total) by mouth 2 (two) times daily.    Dispense:  60 tablet    Refill:  3    Patient Instructions  Medication Instructions:  Your physician has recommended you make the following change in your medication:  START: Ranexa 500 mg take one tablet by mouth twice daily.  *If you need a  refill on your cardiac medications before your next appointment, please call your pharmacy*   Lab Work: None If you have labs (blood work) drawn today and your tests are completely normal, you will receive your results only by: [name] MyChart Message (if you have MyChart) OR . A paper copy in the mail If you have any lab test that is abnormal or we need to change your treatment, we will call you to review the results.   Testing/Procedures: None   Follow-Up: At [facility], you and your health needs are our priority.  As part of our continuing mission to provide you with exceptional heart care, we have created designated Provider Care Teams.  These Care Teams include your primary Cardiologist (physician) and Advanced Practice Providers (APPs -  Physician Assistants and Nurse Practitioners) who all work together to provide you with the care you need, when you need it.  We recommend signing up for the patient portal called "MyChart".  Sign up information is provided on this After Visit Summary.  MyChart is used to connect with patients for Virtual Visits (Telemedicine).  Patients are able to view lab/test results, encounter notes, upcoming appointments, etc.  Non-urgent messages can be sent to your provider as well.   To learn more about what you can do with MyChart, go to [email or web address].    Your next appointment:   3 month(s)  The format for your next appointment:   In Person  Provider:   [name], DO   Other Instructions      Adopting a Healthy Lifestyle.  Know what a healthy weight is for you (roughly BMI <25) and aim to maintain this   Aim for 7+ servings of fruits and vegetables daily   65-80+ fluid ounces of water or unsweet tea for healthy kidneys   Limit to max 1 drink of alcohol per day; avoid smoking/tobacco   Limit animal fats in diet for cholesterol and heart health - choose grass fed whenever available   Avoid highly processed foods, and foods high  in saturated/trans fats   Aim for low stress - take time to unwind and care for your mental health   Aim for 150 min of moderate intensity exercise weekly for heart health, and weights twice weekly  for bone health   Aim for 7-9 hours of sleep daily   When it comes to diets, agreement about the perfect plan isnt easy to find, even among the experts. Experts at the [employer] developed an idea known as the Healthy Eating Plate. Just imagine a plate divided into logical, healthy portions.   The emphasis is on diet quality:   Load up on vegetables and fruits - one-half of your plate: Aim for color and variety, and remember that potatoes dont count.   Go for whole grains - one-quarter of your plate: Whole wheat, barley, wheat berries, quinoa, oats, brown rice, and foods made with them. If you want pasta, go with whole wheat pasta.   Protein power - one-quarter of your plate: Fish, chicken, beans, and nuts are all healthy, versatile protein sources. Limit red meat.   The diet, however, does go beyond the plate, offering a few other suggestions.   Use healthy plant oils, such as olive, canola, soy, corn, sunflower and peanut. Check the labels, and avoid partially hydrogenated oil, which have unhealthy trans fats.   If youre thirsty, drink water. Coffee and tea are good in moderation, but skip sugary drinks and limit milk and dairy products to one or two daily servings.   The type of carbohydrate in the diet is more important than the amount. Some sources of carbohydrates, such as vegetables, fruits, whole grains, and beans-are healthier than others.   Finally, stay active  Signed, [name], DO  04/25/2020 1:39 PM    [facility]

## 2020-04-30 ENCOUNTER — Other Ambulatory Visit: Payer: Self-pay | Admitting: Cardiology

## 2020-07-07 DIAGNOSIS — Z0000 Encounter for general adult medical examination without abnormal findings: Secondary | ICD-10-CM | POA: Diagnosis not present

## 2020-07-07 DIAGNOSIS — E039 Hypothyroidism, unspecified: Secondary | ICD-10-CM | POA: Diagnosis not present

## 2020-07-07 DIAGNOSIS — Z79899 Other long term (current) drug therapy: Secondary | ICD-10-CM | POA: Diagnosis not present

## 2020-07-07 DIAGNOSIS — E7800 Pure hypercholesterolemia, unspecified: Secondary | ICD-10-CM | POA: Diagnosis not present

## 2020-07-07 DIAGNOSIS — Z131 Encounter for screening for diabetes mellitus: Secondary | ICD-10-CM | POA: Diagnosis not present

## 2020-07-09 ENCOUNTER — Telehealth: Payer: Self-pay

## 2020-07-09 NOTE — Telephone Encounter (Signed)
PA approved on CMM for Repatha until 07-18-21.  Key: [id number]

## 2020-07-22 ENCOUNTER — Other Ambulatory Visit: Payer: Self-pay

## 2020-07-24 ENCOUNTER — Encounter: Payer: Self-pay | Admitting: Cardiology

## 2020-07-24 ENCOUNTER — Ambulatory Visit: Payer: Medicare Other | Admitting: Cardiology

## 2020-07-24 ENCOUNTER — Other Ambulatory Visit: Payer: Self-pay

## 2020-07-24 VITALS — BP 124/70 | Temp 78.0°F | Ht 67.0 in | Wt 176.2 lb

## 2020-07-24 DIAGNOSIS — I10 Essential (primary) hypertension: Secondary | ICD-10-CM | POA: Diagnosis not present

## 2020-07-24 DIAGNOSIS — E782 Mixed hyperlipidemia: Secondary | ICD-10-CM | POA: Diagnosis not present

## 2020-07-24 DIAGNOSIS — I2510 Atherosclerotic heart disease of native coronary artery without angina pectoris: Secondary | ICD-10-CM

## 2020-07-24 NOTE — Patient Instructions (Signed)

## 2020-07-24 NOTE — Progress Notes (Signed)
Cardiology Office Note:    Date:  07/24/2020   ID:  [name], DOB Jul 06, 1941, MRN [id number]  PCP:  [name], MD  Cardiologist:  [name], DO  Electrophysiologist:  None   Referring MD: [name], MD    History of Present Illness:    [name] is a 80 y.o. female with a hx of moderate coronary artery disease,hypertension, hyperlipidemia presents for follow-up visit.  I saw the patient April 20, at that time she appeared to be doing well from a cardiovascular standpoint.  I stopped the Plavix.  I saw the patient in October at that time we will optimize her antianginal by adding Ranexa 500 mg twice a day to her regimen.  We will continue the patient on her Repatha and Zetia as well as aspirin.  She is doing well from a cardiovascular standpoint.  She says since starting the Ranexa she has not had any recurrent chest pain.  Past Medical History:  Diagnosis Date  . Arthritis   . Hyperlipemia   . Hypertension   . Hypothyroidism   . Osteoarthritis of left knee 09/20/2017  . PONV (postoperative nausea and vomiting)    "years ago"  . Primary osteoarthritis of right knee 03/11/2016  . Wears glasses     Past Surgical History:  Procedure Laterality Date  . ABDOMINAL HYSTERECTOMY  1978  . ACHILLES TENDON SURGERY Right 06/27/2014   Procedure: RIGHT ACHILLES DEBRIDEMENT AND REPAIR WITH TRANSFER OF FLEXOR HALLUCIS LONGUS TENDON TO THE CALCANEUS  ;  Surgeon: [name], MD;  Location: [facility];  Service: Orthopedics;  Laterality: Right;  . ACHILLES TENDON SURGERY Left 11/28/2014   Procedure: LEFT ACHILLES DEBRIDEMENT AND REPAIR/GASTROC RECESSION;  Surgeon: [name], MD;  Location: [facility];  Service: Orthopedics;  Laterality: Left;  . COLONOSCOPY    . EYE SURGERY  2014,2015   cataracts-both  . GASTROC RECESSION EXTREMITY Left 11/28/2014   Procedure: GASTROC RECESSION EXTREMITY;  Surgeon: [name], MD;  Location:  [facility];  Service: Orthopedics;  Laterality: Left;  . KNEE ARTHROPLASTY Right 03/11/2016   Procedure: RIGHT TOTAL KNEE ARTHROPLASTY WITH NAVIGATION;  Surgeon: Rod Can, MD;  Location: [facility];  Service: Orthopedics;  Laterality: Right;  . TONSILLECTOMY      Current Medications: Current Meds  Medication Sig  . aspirin 81 MG chewable tablet Chew 1 tablet (81 mg total) by mouth 2 (two) times daily.  . calcium carbonate (OS-CAL) 600 MG TABS tablet Take 600 mg by mouth 2 (two) times daily with a meal.  . Cholecalciferol (VITAMIN D) 125 MCG (5000 UT) CAPS Take by mouth daily.  . clobetasol cream (TEMOVATE) 0.05 %   . diclofenac (VOLTAREN) 75 MG EC tablet Take 75 mg by mouth 2 (two) times daily as needed.   [name] esomeprazole (NEXIUM) 20 MG capsule Take 20 mg by mouth daily at 12 noon.  . ezetimibe (ZETIA) 10 MG tablet 10 mg daily.  [name] ketoconazole (NIZORAL) 2 % shampoo Apply 1 application topically 2 (two) times a week.  . latanoprost (XALATAN) 0.005 % ophthalmic solution SMARTSIG:In Eye(s)  . levocetirizine (XYZAL) 5 MG tablet Take 5 mg by mouth every evening.  [name] levothyroxine (SYNTHROID, LEVOTHROID) 100 MCG tablet Take 100 mcg by mouth daily before breakfast.  . lisinopril-hydrochlorothiazide (ZESTORETIC) 10-12.5 MG tablet lisinopril 10 mg-hydrochlorothiazide 12.5 mg tablet  . MegaRed Omega-3 Krill Oil 500 MG CAPS Take by mouth.  . ranolazine (RANEXA) 500 MG 12 hr tablet  Take 1 tablet (500 mg total) by mouth 2 (two) times daily.  [name] REPATHA SURECLICK [id number] MG/ML SOAJ INJECT 140MG  INTO THE SKIN EVERY 14 DAYS.     Allergies:   Patient has no known allergies.   Social History   Socioeconomic History  . Marital status: Married    Spouse name: Not on file  . Number of children: Not on file  . Years of education: Not on file  . Highest education level: Not on file  Occupational History  . Not on file  Tobacco Use  . Smoking status: Never Smoker  . Smokeless tobacco: Never  Used  Substance and Sexual Activity  . Alcohol use: No  . Drug use: No  . Sexual activity: Not on file  Other Topics Concern  . Not on file  Social History Narrative  . Not on file   Social Determinants of Health   Financial Resource Strain: Not on file  Food Insecurity: Not on file  Transportation Needs: Not on file  Physical Activity: Not on file  Stress: Not on file  Social Connections: Not on file     Family History: The patient's family history includes Heart disease in her brother, father, maternal grandfather, paternal grandfather, and sister.  ROS:   Review of Systems  Constitution: Negative for decreased appetite, fever and weight gain.  HENT: Negative for congestion, ear discharge, hoarse voice and sore throat.   Eyes: Negative for discharge, redness, vision loss in right eye and visual halos.  Cardiovascular: Negative for chest pain, dyspnea on exertion, leg swelling, orthopnea and palpitations.  Respiratory: Negative for cough, hemoptysis, shortness of breath and snoring.   Endocrine: Negative for heat intolerance and polyphagia.  Hematologic/Lymphatic: Negative for bleeding problem. Does not bruise/bleed easily.  Skin: Negative for flushing, nail changes, rash and suspicious lesions.  Musculoskeletal: Negative for arthritis, joint pain, muscle cramps, myalgias, neck pain and stiffness.  Gastrointestinal: Negative for abdominal pain, bowel incontinence, diarrhea and excessive appetite.  Genitourinary: Negative for decreased libido, genital sores and incomplete emptying.  Neurological: Negative for brief paralysis, focal weakness, headaches and loss of balance.  Psychiatric/Behavioral: Negative for altered mental status, depression and suicidal ideas.  Allergic/Immunologic: Negative for HIV exposure and persistent infections.    EKGs/Labs/Other Studies Reviewed:    The following studies were reviewed today:   EKG: None today  TTEIMPRESSIONS12/22020 1.  Left ventricular ejection fraction, by visual estimation, is 60 to 65%. The left ventricle has normal function. Left ventricular septal wall thickness was mildly increased. Mildly increased left ventricular posterior wall thickness. There is mildly increased left ventricular hypertrophy.  2. Elevated left ventricular end-diastolic pressure.  3. Left ventricular diastolic parameters are consistent with Grade I diastolic dysfunction (impaired relaxation).  4. The left ventricle has no regional wall motion abnormalities.  5. Global right ventricle has normal systolic function.The right ventricular size is normal. No increase in right ventricular wall thickness.  6. Left atrial size was normal.  7. Right atrial size was normal.  8. The mitral valve is normal in structure. No evidence of mitral valve regurgitation. No evidence of mitral stenosis.  9. The tricuspid valve is normal in structure. Tricuspid valve regurgitation is trivial.  10. The aortic valve is tricuspid. Aortic valve regurgitation is mild. Mild aortic valve sclerosis without stenosis.  11. The pulmonic valve was normal in structure. Pulmonic valve regurgitation is not visualized.  12. Normal pulmonary artery systolic pressure.  13. The inferior vena cava is normal in size with greater  than 50% respiratory variability, suggesting right atrial pressure of 3 mmHg.    CCTAAorta: Normal size. Calcification noted at the aortic root. There is a focal calcification at adjacent the ostial of the RCA. No dissection.  Aortic Valve: Trileaflet. No calcifications.  Coronary Arteries: Normal coronary origin.  RCA is a small caliber co-dominant vessel, gives rise to PDA and PLVB. There is minimal (<25%) luminal irregularities in the proximal portion of the RCA.  Left main is a large artery that gives rise to LAD and LCX arteries.  LAD is a large vessel with three Diagonal branches. There is a moderate (50-69%) lesion  (calcified plaque) in the proximal LAD. There is a second lesion (calcified plaque) in the mid LAD, this lesion appears to be moderate (50-69%).  LCX is a co-dominant artery that gives rise to one large OM1 branch. There is no plaque.  Other findings:  Normal pulmonary vein drainage into the left atrium with no evidence of stenosis.  Normal let atrial appendage without a thrombus.  Normal size of the pulmonary artery.  IMPRESSION: 1. Coronary calcium score of 189. This was 39 percentile for age and sex matched control.  2. Normal Coronary origin with co-dominance.  3. Moderate obstructive CAD noted with lesions in the proximal and mid LAD (calcified plaque 50-69%). CADRADS-3. Study will be sent forFFR.  FFRCT Left Main:0.98  LAD: Proximal: 0.92, Mid: 0.90 Distal 0.90  LCX: Proximal: 0.97, Mid 0.94, Distal: 0.89  RCA: 0.97  IMPRESSION: 1. CT FFR analysis didn't show any significant stenosis. Therefore aggressive medical therapy is recommended  Recent Labs: No results found for requested labs within last 8760 hours.  Recent Lipid Panel No results found for: CHOL, TRIG, HDL, CHOLHDL, VLDL, LDLCALC, LDLDIRECT  Physical Exam:    VS:  BP 124/70   Temp (!) 78 F (25.6 C)   Ht 5\' 7"  (1.702 m)   Wt 176 lb 3.2 oz (79.9 kg)   SpO2 95%   BMI 27.60 kg/m     Wt Readings from Last 3 Encounters:  07/24/20 176 lb 3.2 oz (79.9 kg)  04/25/20 178 lb 9.6 oz (81 kg)  10/24/19 181 lb (82.1 kg)     GEN: Well nourished, well developed in no acute distress HEENT: Normal NECK: No JVD; No carotid bruits LYMPHATICS: No lymphadenopathy CARDIAC: S1S2 noted,RRR, no murmurs, rubs, gallops RESPIRATORY:  Clear to auscultation without rales, wheezing or rhonchi  ABDOMEN: Soft, non-tender, non-distended, +bowel sounds, no guarding. EXTREMITIES: No edema, No cyanosis, no clubbing MUSCULOSKELETAL:  No deformity  SKIN: Warm and dry NEUROLOGIC:  Alert and oriented x 3,  non-focal PSYCHIATRIC:  Normal affect, good insight  ASSESSMENT:    1. Primary hypertension   2. Coronary artery disease involving native coronary artery of native heart without angina pectoris   3. Mixed hyperlipidemia    PLAN:     She is really happy with her health now.  Since starting the Ranexa she tells me that she has not had any shortness of breath or chest pain.  She is doing her activities and is busy.  Her blood pressure susceptible no changes. We will continue patient on her current regimen She had blood work with her primary care doctor were going to request his lab work so I can review for her lipid panel.  The patient is in agreement with the above plan. The patient left the office in stable condition.  The patient will follow up in 6 months   Medication Adjustments/Labs and Tests  Ordered: Current medicines are reviewed at length with the patient today.  Concerns regarding medicines are outlined above.  No orders of the defined types were placed in this encounter.  No orders of the defined types were placed in this encounter.   Patient Instructions  Medication Instructions:  Your physician recommends that you continue on your current medications as directed. Please refer to the Current Medication list given to you today.  *If you need a refill on your cardiac medications before your next appointment, please call your pharmacy*   Lab Work: NONE If you have labs (blood work) drawn today and your tests are completely normal, you will receive your results only by: [name] MyChart Message (if you have MyChart) OR . A paper copy in the mail If you have any lab test that is abnormal or we need to change your treatment, we will call you to review the results.   Testing/Procedures: NONE   Follow-Up: At [facility], you and your health needs are our priority.  As part of our continuing mission to provide you with exceptional heart care, we have created designated  Provider Care Teams.  These Care Teams include your primary Cardiologist (physician) and Advanced Practice Providers (APPs -  Physician Assistants and Nurse Practitioners) who all work together to provide you with the care you need, when you need it.  We recommend signing up for the patient portal called "MyChart".  Sign up information is provided on this After Visit Summary.  MyChart is used to connect with patients for Virtual Visits (Telemedicine).  Patients are able to view lab/test results, encounter notes, upcoming appointments, etc.  Non-urgent messages can be sent to your provider as well.   To learn more about what you can do with MyChart, go to [email or web address].    Your next appointment:   6 month(s)  The format for your next appointment:   In Person  Provider:   [name], DO   Other Instructions      Adopting a Healthy Lifestyle.  Know what a healthy weight is for you (roughly BMI <25) and aim to maintain this   Aim for 7+ servings of fruits and vegetables daily   65-80+ fluid ounces of water or unsweet tea for healthy kidneys   Limit to max 1 drink of alcohol per day; avoid smoking/tobacco   Limit animal fats in diet for cholesterol and heart health - choose grass fed whenever available   Avoid highly processed foods, and foods high in saturated/trans fats   Aim for low stress - take time to unwind and care for your mental health   Aim for 150 min of moderate intensity exercise weekly for heart health, and weights twice weekly for bone health   Aim for 7-9 hours of sleep daily   When it comes to diets, agreement about the perfect plan isnt easy to find, even among the experts. Experts at the [employer] developed an idea known as the Healthy Eating Plate. Just imagine a plate divided into logical, healthy portions.   The emphasis is on diet quality:   Load up on vegetables and fruits - one-half of your plate: Aim for color and  variety, and remember that potatoes dont count.   Go for whole grains - one-quarter of your plate: Whole wheat, barley, wheat berries, quinoa, oats, brown rice, and foods made with them. If you want pasta, go with whole wheat pasta.   Protein power - one-quarter of your plate:  Fish, chicken, beans, and nuts are all healthy, versatile protein sources. Limit red meat.   The diet, however, does go beyond the plate, offering a few other suggestions.   Use healthy plant oils, such as olive, canola, soy, corn, sunflower and peanut. Check the labels, and avoid partially hydrogenated oil, which have unhealthy trans fats.   If youre thirsty, drink water. Coffee and tea are good in moderation, but skip sugary drinks and limit milk and dairy products to one or two daily servings.   The type of carbohydrate in the diet is more important than the amount. Some sources of carbohydrates, such as vegetables, fruits, whole grains, and beans-are healthier than others.   Finally, stay active  Signed, [name], DO  07/24/2020 9:12 AM    [place]

## 2020-08-06 ENCOUNTER — Telehealth: Payer: Self-pay | Admitting: Cardiology

## 2020-08-06 MED ORDER — RANOLAZINE ER 500 MG PO TB12: 500.0000 mg | ORAL_TABLET | Freq: Two times a day (BID) | ORAL | 3 refills | Status: DC

## 2020-08-06 MED ORDER — REPATHA SURECLICK 140 MG/ML ~~LOC~~ SOAJ: SUBCUTANEOUS | 11 refills | Status: AC

## 2020-08-06 NOTE — Telephone Encounter (Signed)
*  STAT* If patient is at the pharmacy, call can be transferred to refill team.   1. Which medications need to be refilled? (please list name of each medication and dose if known) REPATHA SURECLICK 779 MG/ML SOAJ  ranolazine (RANEXA) 500 MG 12 hr tablet       2. Which pharmacy/location (including street and city if local pharmacy) is medication to be sent to? [facility] [address] [place], [place], [place] [address]  3. Do they need a 30 day or 90 day supply? [place]

## 2020-08-06 NOTE — Telephone Encounter (Signed)
Refill sent in per request.  

## 2020-08-17 DIAGNOSIS — E039 Hypothyroidism, unspecified: Secondary | ICD-10-CM | POA: Diagnosis not present

## 2020-08-17 DIAGNOSIS — E7800 Pure hypercholesterolemia, unspecified: Secondary | ICD-10-CM | POA: Diagnosis not present

## 2020-08-17 DIAGNOSIS — J309 Allergic rhinitis, unspecified: Secondary | ICD-10-CM | POA: Diagnosis not present

## 2020-09-15 DIAGNOSIS — J309 Allergic rhinitis, unspecified: Secondary | ICD-10-CM | POA: Diagnosis not present

## 2020-09-15 DIAGNOSIS — E7800 Pure hypercholesterolemia, unspecified: Secondary | ICD-10-CM | POA: Diagnosis not present

## 2020-09-15 DIAGNOSIS — E039 Hypothyroidism, unspecified: Secondary | ICD-10-CM | POA: Diagnosis not present

## 2020-10-15 DIAGNOSIS — J309 Allergic rhinitis, unspecified: Secondary | ICD-10-CM | POA: Diagnosis not present

## 2020-10-15 DIAGNOSIS — E039 Hypothyroidism, unspecified: Secondary | ICD-10-CM | POA: Diagnosis not present

## 2020-10-15 DIAGNOSIS — E7800 Pure hypercholesterolemia, unspecified: Secondary | ICD-10-CM | POA: Diagnosis not present

## 2020-11-10 ENCOUNTER — Other Ambulatory Visit: Payer: Self-pay | Admitting: Cardiology

## 2020-11-11 NOTE — Telephone Encounter (Signed)
Refill sent to pharmacy.   

## 2020-11-26 DIAGNOSIS — D485 Neoplasm of uncertain behavior of skin: Secondary | ICD-10-CM | POA: Diagnosis not present

## 2020-11-26 DIAGNOSIS — D1801 Hemangioma of skin and subcutaneous tissue: Secondary | ICD-10-CM | POA: Diagnosis not present

## 2020-11-26 DIAGNOSIS — L728 Other follicular cysts of the skin and subcutaneous tissue: Secondary | ICD-10-CM | POA: Diagnosis not present

## 2020-12-03 DIAGNOSIS — K219 Gastro-esophageal reflux disease without esophagitis: Secondary | ICD-10-CM | POA: Diagnosis not present

## 2020-12-15 DIAGNOSIS — E039 Hypothyroidism, unspecified: Secondary | ICD-10-CM | POA: Diagnosis not present

## 2020-12-15 DIAGNOSIS — E7800 Pure hypercholesterolemia, unspecified: Secondary | ICD-10-CM | POA: Diagnosis not present

## 2020-12-15 DIAGNOSIS — J309 Allergic rhinitis, unspecified: Secondary | ICD-10-CM | POA: Diagnosis not present

## 2021-01-02 DIAGNOSIS — J029 Acute pharyngitis, unspecified: Secondary | ICD-10-CM | POA: Diagnosis not present

## 2021-01-02 DIAGNOSIS — J040 Acute laryngitis: Secondary | ICD-10-CM | POA: Diagnosis not present

## 2021-01-05 DIAGNOSIS — I119 Hypertensive heart disease without heart failure: Secondary | ICD-10-CM | POA: Diagnosis not present

## 2021-01-05 DIAGNOSIS — N3001 Acute cystitis with hematuria: Secondary | ICD-10-CM | POA: Diagnosis not present

## 2021-01-05 DIAGNOSIS — I700 Atherosclerosis of aorta: Secondary | ICD-10-CM | POA: Diagnosis not present

## 2021-01-05 DIAGNOSIS — I25118 Atherosclerotic heart disease of native coronary artery with other forms of angina pectoris: Secondary | ICD-10-CM | POA: Diagnosis not present

## 2021-01-05 DIAGNOSIS — E039 Hypothyroidism, unspecified: Secondary | ICD-10-CM | POA: Diagnosis not present

## 2021-01-14 DIAGNOSIS — L728 Other follicular cysts of the skin and subcutaneous tissue: Secondary | ICD-10-CM | POA: Diagnosis not present

## 2021-01-14 DIAGNOSIS — L570 Actinic keratosis: Secondary | ICD-10-CM | POA: Diagnosis not present

## 2021-01-15 DIAGNOSIS — E039 Hypothyroidism, unspecified: Secondary | ICD-10-CM | POA: Diagnosis not present

## 2021-01-15 DIAGNOSIS — J309 Allergic rhinitis, unspecified: Secondary | ICD-10-CM | POA: Diagnosis not present

## 2021-01-15 DIAGNOSIS — E7800 Pure hypercholesterolemia, unspecified: Secondary | ICD-10-CM | POA: Diagnosis not present

## 2021-01-26 DIAGNOSIS — N3001 Acute cystitis with hematuria: Secondary | ICD-10-CM | POA: Diagnosis not present

## 2021-01-26 DIAGNOSIS — I25118 Atherosclerotic heart disease of native coronary artery with other forms of angina pectoris: Secondary | ICD-10-CM | POA: Diagnosis not present

## 2021-01-30 ENCOUNTER — Other Ambulatory Visit: Payer: Self-pay | Admitting: Cardiology

## 2021-02-09 DIAGNOSIS — K219 Gastro-esophageal reflux disease without esophagitis: Secondary | ICD-10-CM | POA: Diagnosis not present

## 2021-02-09 DIAGNOSIS — K649 Unspecified hemorrhoids: Secondary | ICD-10-CM | POA: Diagnosis not present

## 2021-02-09 DIAGNOSIS — Z86010 Personal history of colonic polyps: Secondary | ICD-10-CM | POA: Diagnosis not present

## 2021-02-09 DIAGNOSIS — K5900 Constipation, unspecified: Secondary | ICD-10-CM | POA: Diagnosis not present

## 2021-02-15 DIAGNOSIS — E039 Hypothyroidism, unspecified: Secondary | ICD-10-CM | POA: Diagnosis not present

## 2021-02-15 DIAGNOSIS — J309 Allergic rhinitis, unspecified: Secondary | ICD-10-CM | POA: Diagnosis not present

## 2021-02-15 DIAGNOSIS — E7800 Pure hypercholesterolemia, unspecified: Secondary | ICD-10-CM | POA: Diagnosis not present

## 2021-02-16 DIAGNOSIS — N3001 Acute cystitis with hematuria: Secondary | ICD-10-CM | POA: Diagnosis not present

## 2021-03-27 DIAGNOSIS — N3001 Acute cystitis with hematuria: Secondary | ICD-10-CM | POA: Diagnosis not present

## 2021-03-27 DIAGNOSIS — Z7689 Persons encountering health services in other specified circumstances: Secondary | ICD-10-CM | POA: Diagnosis not present

## 2021-05-18 DIAGNOSIS — I119 Hypertensive heart disease without heart failure: Secondary | ICD-10-CM | POA: Diagnosis not present

## 2021-05-18 DIAGNOSIS — E039 Hypothyroidism, unspecified: Secondary | ICD-10-CM | POA: Diagnosis not present

## 2021-05-18 DIAGNOSIS — E7800 Pure hypercholesterolemia, unspecified: Secondary | ICD-10-CM | POA: Diagnosis not present

## 2021-06-29 DIAGNOSIS — E7800 Pure hypercholesterolemia, unspecified: Secondary | ICD-10-CM | POA: Diagnosis not present

## 2021-07-17 DIAGNOSIS — E7800 Pure hypercholesterolemia, unspecified: Secondary | ICD-10-CM | POA: Diagnosis not present

## 2021-07-17 DIAGNOSIS — I10 Essential (primary) hypertension: Secondary | ICD-10-CM | POA: Diagnosis not present

## 2021-07-17 DIAGNOSIS — E039 Hypothyroidism, unspecified: Secondary | ICD-10-CM | POA: Diagnosis not present

## 2021-08-18 DIAGNOSIS — E039 Hypothyroidism, unspecified: Secondary | ICD-10-CM | POA: Diagnosis not present

## 2021-08-18 DIAGNOSIS — E7800 Pure hypercholesterolemia, unspecified: Secondary | ICD-10-CM | POA: Diagnosis not present

## 2021-08-18 DIAGNOSIS — I10 Essential (primary) hypertension: Secondary | ICD-10-CM | POA: Diagnosis not present

## 2021-08-20 DIAGNOSIS — Z0000 Encounter for general adult medical examination without abnormal findings: Secondary | ICD-10-CM | POA: Diagnosis not present

## 2021-08-20 DIAGNOSIS — E039 Hypothyroidism, unspecified: Secondary | ICD-10-CM | POA: Diagnosis not present

## 2021-08-20 DIAGNOSIS — Z79899 Other long term (current) drug therapy: Secondary | ICD-10-CM | POA: Diagnosis not present

## 2021-09-15 DIAGNOSIS — E039 Hypothyroidism, unspecified: Secondary | ICD-10-CM | POA: Diagnosis not present

## 2021-09-15 DIAGNOSIS — E7800 Pure hypercholesterolemia, unspecified: Secondary | ICD-10-CM | POA: Diagnosis not present

## 2021-09-15 DIAGNOSIS — I10 Essential (primary) hypertension: Secondary | ICD-10-CM | POA: Diagnosis not present

## 2021-09-17 DIAGNOSIS — E7800 Pure hypercholesterolemia, unspecified: Secondary | ICD-10-CM | POA: Diagnosis not present

## 2021-09-21 DIAGNOSIS — H26491 Other secondary cataract, right eye: Secondary | ICD-10-CM | POA: Diagnosis not present

## 2021-09-21 DIAGNOSIS — H0011 Chalazion right upper eyelid: Secondary | ICD-10-CM | POA: Diagnosis not present

## 2021-09-21 DIAGNOSIS — Z961 Presence of intraocular lens: Secondary | ICD-10-CM | POA: Diagnosis not present

## 2021-09-21 DIAGNOSIS — H43393 Other vitreous opacities, bilateral: Secondary | ICD-10-CM | POA: Diagnosis not present

## 2021-09-30 DIAGNOSIS — J40 Bronchitis, not specified as acute or chronic: Secondary | ICD-10-CM | POA: Diagnosis not present

## 2021-09-30 DIAGNOSIS — J329 Chronic sinusitis, unspecified: Secondary | ICD-10-CM | POA: Diagnosis not present

## 2021-11-26 ENCOUNTER — Other Ambulatory Visit: Payer: Self-pay | Admitting: Cardiology

## 2021-11-26 DIAGNOSIS — Z1231 Encounter for screening mammogram for malignant neoplasm of breast: Secondary | ICD-10-CM | POA: Diagnosis not present

## 2021-12-07 ENCOUNTER — Other Ambulatory Visit: Payer: Self-pay | Admitting: Cardiology

## 2021-12-28 ENCOUNTER — Telehealth: Payer: Self-pay

## 2021-12-28 NOTE — Telephone Encounter (Signed)
Pt came in the office complaining of jaw pain and left arm pain. Advised pt to go to the ER to be evaluated. Explained that women often present with more vague symptoms and it would be best to have an evaluation today. Pt agreed and verbalized understanding.

## 2022-01-04 DIAGNOSIS — E039 Hypothyroidism, unspecified: Secondary | ICD-10-CM | POA: Diagnosis not present

## 2022-01-15 DIAGNOSIS — I119 Hypertensive heart disease without heart failure: Secondary | ICD-10-CM | POA: Diagnosis not present

## 2022-01-15 DIAGNOSIS — E039 Hypothyroidism, unspecified: Secondary | ICD-10-CM | POA: Diagnosis not present

## 2022-01-15 DIAGNOSIS — E7800 Pure hypercholesterolemia, unspecified: Secondary | ICD-10-CM | POA: Diagnosis not present

## 2022-01-25 DIAGNOSIS — R0782 Intercostal pain: Secondary | ICD-10-CM | POA: Diagnosis not present

## 2022-02-08 DIAGNOSIS — M7061 Trochanteric bursitis, right hip: Secondary | ICD-10-CM | POA: Insufficient documentation

## 2022-02-08 DIAGNOSIS — M1611 Unilateral primary osteoarthritis, right hip: Secondary | ICD-10-CM | POA: Diagnosis not present

## 2022-02-15 DIAGNOSIS — E7800 Pure hypercholesterolemia, unspecified: Secondary | ICD-10-CM | POA: Diagnosis not present

## 2022-02-15 DIAGNOSIS — I119 Hypertensive heart disease without heart failure: Secondary | ICD-10-CM | POA: Diagnosis not present

## 2022-02-15 DIAGNOSIS — E039 Hypothyroidism, unspecified: Secondary | ICD-10-CM | POA: Diagnosis not present

## 2022-02-17 DIAGNOSIS — I700 Atherosclerosis of aorta: Secondary | ICD-10-CM | POA: Diagnosis not present

## 2022-02-17 DIAGNOSIS — E7800 Pure hypercholesterolemia, unspecified: Secondary | ICD-10-CM | POA: Diagnosis not present

## 2022-02-17 DIAGNOSIS — E039 Hypothyroidism, unspecified: Secondary | ICD-10-CM | POA: Diagnosis not present

## 2022-02-17 DIAGNOSIS — R0609 Other forms of dyspnea: Secondary | ICD-10-CM | POA: Diagnosis not present

## 2022-02-17 DIAGNOSIS — Z79899 Other long term (current) drug therapy: Secondary | ICD-10-CM | POA: Diagnosis not present

## 2022-02-17 DIAGNOSIS — Z789 Other specified health status: Secondary | ICD-10-CM | POA: Diagnosis not present

## 2022-02-17 DIAGNOSIS — I209 Angina pectoris, unspecified: Secondary | ICD-10-CM | POA: Diagnosis not present

## 2022-02-25 ENCOUNTER — Encounter: Payer: Self-pay | Admitting: Cardiology

## 2022-02-25 ENCOUNTER — Encounter: Payer: Self-pay | Admitting: *Deleted

## 2022-02-25 DIAGNOSIS — M159 Polyosteoarthritis, unspecified: Secondary | ICD-10-CM | POA: Insufficient documentation

## 2022-02-25 DIAGNOSIS — Z789 Other specified health status: Secondary | ICD-10-CM | POA: Insufficient documentation

## 2022-02-25 DIAGNOSIS — I119 Hypertensive heart disease without heart failure: Secondary | ICD-10-CM

## 2022-02-26 DIAGNOSIS — D649 Anemia, unspecified: Secondary | ICD-10-CM | POA: Diagnosis not present

## 2022-02-26 DIAGNOSIS — E039 Hypothyroidism, unspecified: Secondary | ICD-10-CM | POA: Diagnosis not present

## 2022-03-08 ENCOUNTER — Encounter: Payer: Self-pay | Admitting: Cardiology

## 2022-03-08 ENCOUNTER — Ambulatory Visit: Payer: Medicare Other | Admitting: Cardiology

## 2022-03-08 ENCOUNTER — Other Ambulatory Visit: Payer: Self-pay

## 2022-03-08 ENCOUNTER — Other Ambulatory Visit (HOSPITAL_COMMUNITY): Payer: Self-pay

## 2022-03-08 VITALS — BP 104/60 | HR 64 | Ht 67.0 in | Wt 171.2 lb

## 2022-03-08 DIAGNOSIS — Z789 Other specified health status: Secondary | ICD-10-CM | POA: Diagnosis not present

## 2022-03-08 DIAGNOSIS — I10 Essential (primary) hypertension: Secondary | ICD-10-CM | POA: Diagnosis not present

## 2022-03-08 DIAGNOSIS — E782 Mixed hyperlipidemia: Secondary | ICD-10-CM

## 2022-03-08 DIAGNOSIS — I2510 Atherosclerotic heart disease of native coronary artery without angina pectoris: Secondary | ICD-10-CM | POA: Diagnosis not present

## 2022-03-08 DIAGNOSIS — R0609 Other forms of dyspnea: Secondary | ICD-10-CM

## 2022-03-08 MED ORDER — RANOLAZINE ER 1000 MG PO TB12
1000.0000 mg | ORAL_TABLET | Freq: Two times a day (BID) | ORAL | 3 refills | Status: DC
Start: 2022-03-08 — End: 2022-10-05

## 2022-03-08 NOTE — Patient Instructions (Addendum)
Medication Instructions:  Your physician has recommended you make the following change in your medication:   INCREASE: Ranolazine to '1000mg'$  twice daily   Lab Work: None Ordered If you have labs (blood work) drawn today and your tests are completely normal, you will receive your results only by: [place] (if you have MyChart) OR A paper copy in the mail If you have any lab test that is abnormal or we need to change your treatment, we will call you to review the results.   Testing/Procedures: Your physician has requested that you have an echocardiogram. Echocardiography is a painless test that uses sound waves to create images of your heart. It provides your doctor with information about the size and shape of your heart and how well your heart's chambers and valves are working. This procedure takes approximately one hour. There are no restrictions for this procedure.    Follow-Up: At [facility], you and your health needs are our priority.  As part of our continuing mission to provide you with exceptional heart care, we have created designated Provider Care Teams.  These Care Teams include your primary Cardiologist (physician) and Advanced Practice Providers (APPs -  Physician Assistants and Nurse Practitioners) who all work together to provide you with the care you need, when you need it.  We recommend signing up for the patient portal called "MyChart".  Sign up information is provided on this After Visit Summary.  MyChart is used to connect with patients for Virtual Visits (Telemedicine).  Patients are able to view lab/test results, encounter notes, upcoming appointments, etc.  Non-urgent messages can be sent to your provider as well.   To learn more about what you can do with MyChart, go to [email or web address].    Your next appointment:   1 month(s)  The format for your next appointment:   In Person  Provider:   Jenne Campus, MD    Other Instructions NA

## 2022-03-08 NOTE — Progress Notes (Unsigned)
Cardiology Office Note:    Date:  03/08/2022   ID:  [name], DOB [birth year], MRN [id number]  PCP:  [name], MD  Cardiologist:  Jenne Campus, MD    Referring MD: [name], MD   Chief Complaint  Patient presents with   Chest Pain   L arm pain    Ongoing for a year    History of Present Illness:    [name] is a 81 y.o. female with past medical history significant for essential hypertension, dyslipidemia, intolerance to statin but she is on PCSK9 agent.  In 2020 she had a coronary CT angio which showed borderline lesions in proximal and mid LAD, fractional flow reserve has been performed thereafter which showed no hemodynamically significant lesions. She comes today to my office for follow-up.  Overall doing well but she described the fact that she is getting short of breath much easier she loves to walk and she cannot complete her walks.  She also described to have chest pain that this is atypical sharp stabbing lasting only for split-second happened in the morning when she wakes up.  She does not have any exertional tightness squeezing pressure burning chest.  Past Medical History:  Diagnosis Date   Arthritis    Hyperlipemia    Hypertension    Hypertensive heart disease without heart failure    Hypothyroidism    Osteoarthritis of left knee 09/20/2017   Osteoarthritis of multiple joints    PONV (postoperative nausea and vomiting)    "years ago"   Primary osteoarthritis of right knee 03/11/2016   Statin intolerance    Wears glasses     Past Surgical History:  Procedure Laterality Date   ABDOMINAL HYSTERECTOMY  07/19/1976   ACHILLES TENDON SURGERY Right 06/27/2014   Procedure: RIGHT ACHILLES DEBRIDEMENT AND REPAIR WITH TRANSFER OF FLEXOR HALLUCIS LONGUS TENDON TO THE CALCANEUS  ;  Surgeon: [name], MD;  Location: [place];  Service: Orthopedics;  Laterality: Right;   ACHILLES TENDON SURGERY Left 11/28/2014    Procedure: LEFT ACHILLES DEBRIDEMENT AND REPAIR/GASTROC RECESSION;  Surgeon: [name], MD;  Location: [place];  Service: Orthopedics;  Laterality: Left;   CATARACT EXTRACTION W/ INTRAOCULAR LENS IMPLANT Bilateral 2016   COLONOSCOPY     GASTROC RECESSION EXTREMITY Left 11/28/2014   Procedure: GASTROC RECESSION EXTREMITY;  Surgeon: [name], MD;  Location: [place];  Service: Orthopedics;  Laterality: Left;   KNEE ARTHROPLASTY Right 03/11/2016   Procedure: RIGHT TOTAL KNEE ARTHROPLASTY WITH NAVIGATION;  Surgeon: Rod Can, MD;  Location: [facility];  Service: Orthopedics;  Laterality: Right;   TONSILLECTOMY      Current Medications: Current Meds  Medication Sig   aspirin 81 MG chewable tablet Chew 1 tablet (81 mg total) by mouth 2 (two) times daily.   calcium carbonate (OS-CAL) 600 MG TABS tablet Take 600 mg by mouth 2 (two) times daily with a meal.   cholecalciferol (VITAMIN D3) 25 MCG (1000 UNIT) tablet Take 1,000 Units by mouth daily.   Cyanocobalamin (VITAMIN B-12 SL) Place 2,500 mcg under the tongue daily.   diclofenac (VOLTAREN) 75 MG EC tablet Take 1 tablet by mouth 2 (two) times daily as needed for pain.   Evolocumab (REPATHA SURECLICK) 056 MG/ML SOAJ INJECT '140MG'$  INTO THE SKIN EVERY 14 DAYS. (Patient taking differently: Inject 140 mg into the skin every 14 (fourteen) days. INJECT '140MG'$  INTO THE SKIN EVERY 14 DAYS.)   famotidine (PEPCID) 20 MG tablet Take  20 mg by mouth daily as needed for heartburn or indigestion.   LATANOPROST OP Place 1 drop into both eyes daily.   levothyroxine (SYNTHROID) 88 MCG tablet Take 1 tablet by mouth daily.   lisinopril-hydrochlorothiazide (ZESTORETIC) 10-12.5 MG tablet Take 1 tablet by mouth daily.   Omega-3 Fatty Acids (OMEGA-3 FISH OIL PO) Take 2 capsules by mouth daily. 350 mg daily   ranolazine (RANEXA) 500 MG 12 hr tablet TAKE ONE TABLET BY MOUTH TWICE DAILY (Patient taking differently: Take 500 mg by mouth 2  (two) times daily.)   vitamin E 180 MG (400 UNITS) capsule Take 400 Units by mouth daily.     Allergies:   Crestor [rosuvastatin], Statins, and Potassium penicillin g [penicillin g]   Social History   Socioeconomic History   Marital status: Married    Spouse name: Not on file   Number of children: Not on file   Years of education: Not on file   Highest education level: Not on file  Occupational History   Not on file  Tobacco Use   Smoking status: Never   Smokeless tobacco: Never  Substance and Sexual Activity   Alcohol use: No   Drug use: No   Sexual activity: Not on file  Other Topics Concern   Not on file  Social History Narrative   Not on file   Social Determinants of Health   Financial Resource Strain: Not on file  Food Insecurity: Not on file  Transportation Needs: Not on file  Physical Activity: Not on file  Stress: Not on file  Social Connections: Not on file     Family History: The patient's family history includes Heart disease in her brother, father, maternal grandfather, paternal grandfather, and sister; Hypercholesterolemia in her brother and sister. ROS:   Please see the history of present illness.    All 14 point review of systems negative except as described per history of present illness  EKGs/Labs/Other Studies Reviewed:      Recent Labs: No results found for requested labs within last 365 days.  Recent Lipid Panel No results found for: "CHOL", "TRIG", "HDL", "CHOLHDL", "VLDL", "LDLCALC", "LDLDIRECT"  Physical Exam:    VS:  BP 104/60 (BP Location: Right Arm, Patient Position: Sitting)   Pulse 64   Ht '5\' 7"'$  (1.702 m)   Wt 171 lb 3.2 oz (77.7 kg)   SpO2 95%   BMI 26.81 kg/m     Wt Readings from Last 3 Encounters:  03/08/22 171 lb 3.2 oz (77.7 kg)  02/17/22 172 lb (78 kg)  07/24/20 176 lb 3.2 oz (79.9 kg)     GEN:  Well nourished, well developed in no acute distress HEENT: Normal NECK: No JVD; No carotid bruits LYMPHATICS: No  lymphadenopathy CARDIAC: RRR, no murmurs, no rubs, no gallops RESPIRATORY:  Clear to auscultation without rales, wheezing or rhonchi  ABDOMEN: Soft, non-tender, non-distended MUSCULOSKELETAL:  No edema; No deformity  SKIN: Warm and dry LOWER EXTREMITIES: no swelling NEUROLOGIC:  Alert and oriented x 3 PSYCHIATRIC:  Normal affect   ASSESSMENT:    1. Coronary artery disease involving native coronary artery of native heart without angina pectoris   2. Primary hypertension   3. Mixed hyperlipidemia   4. Statin intolerance    PLAN:    In order of problems listed above:  Coronary artery disease with known proximal mid LAD fractional flow reserve 3 years ago negative.  But she does have some symptoms that are concerning.  I will ask her  to have increase dose of ranolazine to 1000 mg twice daily, because of dyspnea on exertion I will ask her to have an echocardiogram to check left ventricle ejection fraction. Dyslipidemia continue with PCSK9 agent.  I do have data from [facility] with total cholesterol 225 HDL 57.  I will call to get full propose for profile. She will hypertension: Blood pressure control we will continue present management.   Medication Adjustments/Labs and Tests Ordered: Current medicines are reviewed at length with the patient today.  Concerns regarding medicines are outlined above.  No orders of the defined types were placed in this encounter.  Medication changes: No orders of the defined types were placed in this encounter.   Signed, Park Liter, MD, [facility] 03/08/2022 8:33 AM    [place]

## 2022-03-10 ENCOUNTER — Ambulatory Visit (INDEPENDENT_AMBULATORY_CARE_PROVIDER_SITE_OTHER): Payer: Medicare Other

## 2022-03-10 DIAGNOSIS — M9904 Segmental and somatic dysfunction of sacral region: Secondary | ICD-10-CM | POA: Diagnosis not present

## 2022-03-10 DIAGNOSIS — R0609 Other forms of dyspnea: Secondary | ICD-10-CM

## 2022-03-10 DIAGNOSIS — M9901 Segmental and somatic dysfunction of cervical region: Secondary | ICD-10-CM | POA: Diagnosis not present

## 2022-03-10 DIAGNOSIS — M9902 Segmental and somatic dysfunction of thoracic region: Secondary | ICD-10-CM | POA: Diagnosis not present

## 2022-03-10 DIAGNOSIS — M9903 Segmental and somatic dysfunction of lumbar region: Secondary | ICD-10-CM | POA: Diagnosis not present

## 2022-03-10 LAB — ECHOCARDIOGRAM COMPLETE
Area-P 1/2: 2.5 cm2
Calc EF: 55.8 %
P 1/2 time: 551 msec
S' Lateral: 2.9 cm
Single Plane A2C EF: 56.1 %
Single Plane A4C EF: 54.1 %

## 2022-03-12 ENCOUNTER — Telehealth: Payer: Self-pay

## 2022-03-12 NOTE — Telephone Encounter (Signed)
-----   Message from Park Liter, MD sent at 03/11/2022  8:21 AM EDT ----- Echocardiogram showed preserved left ventricle ejection fraction, overall looks good

## 2022-03-12 NOTE — Telephone Encounter (Signed)
Patient notified of results.

## 2022-03-15 DIAGNOSIS — M549 Dorsalgia, unspecified: Secondary | ICD-10-CM | POA: Diagnosis not present

## 2022-03-15 DIAGNOSIS — M4606 Spinal enthesopathy, lumbar region: Secondary | ICD-10-CM | POA: Diagnosis not present

## 2022-03-18 DIAGNOSIS — I119 Hypertensive heart disease without heart failure: Secondary | ICD-10-CM | POA: Diagnosis not present

## 2022-03-18 DIAGNOSIS — E7800 Pure hypercholesterolemia, unspecified: Secondary | ICD-10-CM | POA: Diagnosis not present

## 2022-03-18 DIAGNOSIS — E039 Hypothyroidism, unspecified: Secondary | ICD-10-CM | POA: Diagnosis not present

## 2022-03-23 DIAGNOSIS — M6281 Muscle weakness (generalized): Secondary | ICD-10-CM | POA: Diagnosis not present

## 2022-03-23 DIAGNOSIS — M25551 Pain in right hip: Secondary | ICD-10-CM | POA: Diagnosis not present

## 2022-03-23 DIAGNOSIS — M25651 Stiffness of right hip, not elsewhere classified: Secondary | ICD-10-CM | POA: Diagnosis not present

## 2022-03-29 DIAGNOSIS — M25651 Stiffness of right hip, not elsewhere classified: Secondary | ICD-10-CM | POA: Diagnosis not present

## 2022-03-29 DIAGNOSIS — M6281 Muscle weakness (generalized): Secondary | ICD-10-CM | POA: Diagnosis not present

## 2022-03-29 DIAGNOSIS — M25551 Pain in right hip: Secondary | ICD-10-CM | POA: Diagnosis not present

## 2022-03-31 DIAGNOSIS — M25551 Pain in right hip: Secondary | ICD-10-CM | POA: Diagnosis not present

## 2022-03-31 DIAGNOSIS — M6281 Muscle weakness (generalized): Secondary | ICD-10-CM | POA: Diagnosis not present

## 2022-03-31 DIAGNOSIS — M25651 Stiffness of right hip, not elsewhere classified: Secondary | ICD-10-CM | POA: Diagnosis not present

## 2022-04-01 DIAGNOSIS — M545 Low back pain, unspecified: Secondary | ICD-10-CM | POA: Diagnosis not present

## 2022-04-08 ENCOUNTER — Ambulatory Visit: Payer: Medicare Other | Attending: Cardiology | Admitting: Cardiology

## 2022-04-08 ENCOUNTER — Encounter: Payer: Self-pay | Admitting: Cardiology

## 2022-04-08 VITALS — BP 130/70 | HR 60 | Ht 67.0 in | Wt 173.0 lb

## 2022-04-08 DIAGNOSIS — I10 Essential (primary) hypertension: Secondary | ICD-10-CM

## 2022-04-08 DIAGNOSIS — E782 Mixed hyperlipidemia: Secondary | ICD-10-CM

## 2022-04-08 DIAGNOSIS — I2510 Atherosclerotic heart disease of native coronary artery without angina pectoris: Secondary | ICD-10-CM

## 2022-04-08 NOTE — Progress Notes (Signed)
Cardiology Office Note:    Date:  04/08/2022   ID:  [name], DOB [birth year], MRN [id number]  PCP:  [name], MD  Cardiologist:  Jenne Campus, MD    Referring MD: [name], MD   Chief Complaint  Patient presents with   Follow-up  Doing well  History of Present Illness:    [name] is a 81 y.o. female with past medical history significant for essential hypertension, dyslipidemia, intolerance to statin but now she is on PCSK9 agent tolerating well, in 2020 she did have coronary CT angio which showed moderate disease in proximal L mid LAD, she did have fractional flow reserve done which was negative.  She presented to me last month ago complaining of the fact that when she was doing her routine work she was unable to finish because of shortness of breath fatigue and pain pain in the chest as well as in the back I increase dose of ranolazine and she is coming today to my office for follow-up.  She says she is feeling much better.  She added some more information about her symptomatology.  She does have lower back pain.  She was put on prednisone and feeling dramatically better pain is gone I talked to her about options for this situation thinking that maybe there is worsening for coronary artery disease so we will talk about potentially doing cardiac catheterization however, she does not want to do it she says she is feeling well and doing well and does not want any invasive test done.  Past Medical History:  Diagnosis Date   Arthritis    Hyperlipemia    Hypertension    Hypertensive heart disease without heart failure    Hypothyroidism    Osteoarthritis of left knee 09/20/2017   Osteoarthritis of multiple joints    PONV (postoperative nausea and vomiting)    "years ago"   Primary osteoarthritis of right knee 03/11/2016   Statin intolerance    Wears glasses     Past Surgical History:  Procedure Laterality Date   ABDOMINAL HYSTERECTOMY  07/19/1976    ACHILLES TENDON SURGERY Right 06/27/2014   Procedure: RIGHT ACHILLES DEBRIDEMENT AND REPAIR WITH TRANSFER OF FLEXOR HALLUCIS LONGUS TENDON TO THE CALCANEUS  ;  Surgeon: [name], MD;  Location: [place];  Service: Orthopedics;  Laterality: Right;   ACHILLES TENDON SURGERY Left 11/28/2014   Procedure: LEFT ACHILLES DEBRIDEMENT AND REPAIR/GASTROC RECESSION;  Surgeon: [name], MD;  Location: [place];  Service: Orthopedics;  Laterality: Left;   CATARACT EXTRACTION W/ INTRAOCULAR LENS IMPLANT Bilateral 2016   COLONOSCOPY     GASTROC RECESSION EXTREMITY Left 11/28/2014   Procedure: GASTROC RECESSION EXTREMITY;  Surgeon: [name], MD;  Location: [place];  Service: Orthopedics;  Laterality: Left;   KNEE ARTHROPLASTY Right 03/11/2016   Procedure: RIGHT TOTAL KNEE ARTHROPLASTY WITH NAVIGATION;  Surgeon: Rod Can, MD;  Location: [facility];  Service: Orthopedics;  Laterality: Right;   TONSILLECTOMY      Current Medications: Current Meds  Medication Sig   aspirin 81 MG chewable tablet Chew 1 tablet (81 mg total) by mouth 2 (two) times daily.   calcium carbonate (OS-CAL) 600 MG TABS tablet Take 600 mg by mouth 2 (two) times daily with a meal.   cholecalciferol (VITAMIN D3) 25 MCG (1000 UNIT) tablet Take 1,000 Units by mouth daily.   Cyanocobalamin (VITAMIN B-12 SL) Place 2,500 mcg under the tongue daily.   diclofenac (VOLTAREN) 75  MG EC tablet Take 1 tablet by mouth 2 (two) times daily as needed for pain.   Evolocumab (REPATHA SURECLICK) 732 MG/ML SOAJ INJECT '140MG'$  INTO THE SKIN EVERY 14 DAYS.   famotidine (PEPCID) 20 MG tablet Take 20 mg by mouth daily as needed for heartburn or indigestion.   LATANOPROST OP Place 1 drop into both eyes daily.   levothyroxine (SYNTHROID) 88 MCG tablet Take 1 tablet by mouth daily.   lisinopril-hydrochlorothiazide (ZESTORETIC) 10-12.5 MG tablet Take 1 tablet by mouth daily.   Omega-3 Fatty Acids (OMEGA-3  FISH OIL PO) Take 2 capsules by mouth daily. 350 mg daily   predniSONE (DELTASONE) 10 MG tablet Take 10 mg by mouth daily.   ranolazine (RANEXA) 1000 MG SR tablet Take 1 tablet (1,000 mg total) by mouth 2 (two) times daily.   timolol (TIMOPTIC) 0.25 % ophthalmic solution Place 1 drop into both eyes every morning.   vitamin E 180 MG (400 UNITS) capsule Take 400 Units by mouth daily.     Allergies:   Crestor [rosuvastatin], Statins, and Potassium penicillin g [penicillin g]   Social History   Socioeconomic History   Marital status: Married    Spouse name: Not on file   Number of children: Not on file   Years of education: Not on file   Highest education level: Not on file  Occupational History   Not on file  Tobacco Use   Smoking status: Never   Smokeless tobacco: Never  Substance and Sexual Activity   Alcohol use: No   Drug use: No   Sexual activity: Not on file  Other Topics Concern   Not on file  Social History Narrative   Not on file   Social Determinants of Health   Financial Resource Strain: Not on file  Food Insecurity: Not on file  Transportation Needs: Not on file  Physical Activity: Not on file  Stress: Not on file  Social Connections: Not on file     Family History: The patient's family history includes Heart disease in her brother, father, maternal grandfather, paternal grandfather, and sister; Hypercholesterolemia in her brother and sister. ROS:   Please see the history of present illness.    All 14 point review of systems negative except as described per history of present illness  EKGs/Labs/Other Studies Reviewed:      Recent Labs: No results found for requested labs within last 365 days.  Recent Lipid Panel No results found for: "CHOL", "TRIG", "HDL", "CHOLHDL", "VLDL", "LDLCALC", "LDLDIRECT"  Physical Exam:    VS:  BP 130/70 (BP Location: Right Arm, Patient Position: Sitting, Cuff Size: Normal)   Pulse 60   Ht '5\' 7"'$  (1.702 m)   Wt 173 lb  (78.5 kg)   SpO2 97%   BMI 27.10 kg/m     Wt Readings from Last 3 Encounters:  04/08/22 173 lb (78.5 kg)  03/08/22 171 lb 3.2 oz (77.7 kg)  02/17/22 172 lb (78 kg)     GEN:  Well nourished, well developed in no acute distress HEENT: Normal NECK: No JVD; No carotid bruits LYMPHATICS: No lymphadenopathy CARDIAC: RRR, no murmurs, no rubs, no gallops RESPIRATORY:  Clear to auscultation without rales, wheezing or rhonchi  ABDOMEN: Soft, non-tender, non-distended MUSCULOSKELETAL:  No edema; No deformity  SKIN: Warm and dry LOWER EXTREMITIES: no swelling NEUROLOGIC:  Alert and oriented x 3 PSYCHIATRIC:  Normal affect   ASSESSMENT:    1. Coronary artery disease involving native coronary artery of native heart without angina  pectoris   2. Primary hypertension   3. Mixed hyperlipidemia    PLAN:    In order of problems listed above:  Coronary artery disease seems to be stable now from that point review we will continue present management.  I also told her if she is still having more chest pain change her mind in terms of potentially doing cardiac catheterization already to do that Primary hypertension, blood pressure well controlled continue present management. Dyslipidemia she is intolerant to statin K PN show me data from March with total cholesterol 2-125 and HDL 57.  I will ask her to have fasting lipid profile today   Medication Adjustments/Labs and Tests Ordered: Current medicines are reviewed at length with the patient today.  Concerns regarding medicines are outlined above.  No orders of the defined types were placed in this encounter.  Medication changes: No orders of the defined types were placed in this encounter.   Signed, Park Liter, MD, [facility] 04/08/2022 8:21 AM    [place]

## 2022-04-08 NOTE — Patient Instructions (Signed)
Medication Instructions:  Your physician recommends that you continue on your current medications as directed. Please refer to the Current Medication list given to you today.  *If you need a refill on your cardiac medications before your next appointment, please call your pharmacy*   Lab Work: Your physician recommends that you have your lipids checked today in the office. If you have labs (blood work) drawn today and your tests are completely normal, you will receive your results only by: [place] (if you have MyChart) OR A paper copy in the mail If you have any lab test that is abnormal or we need to change your treatment, we will call you to review the results.   Testing/Procedures: None ordered   Follow-Up: At [facility], you and your health needs are our priority.  As part of our continuing mission to provide you with exceptional heart care, we have created designated Provider Care Teams.  These Care Teams include your primary Cardiologist (physician) and Advanced Practice Providers (APPs -  Physician Assistants and Nurse Practitioners) who all work together to provide you with the care you need, when you need it.  We recommend signing up for the patient portal called "MyChart".  Sign up information is provided on this After Visit Summary.  MyChart is used to connect with patients for Virtual Visits (Telemedicine).  Patients are able to view lab/test results, encounter notes, upcoming appointments, etc.  Non-urgent messages can be sent to your provider as well.   To learn more about what you can do with MyChart, go to [email or web address].    Your next appointment:   3 month(s)  The format for your next appointment:   In Person  Provider:   Jenne Campus, MD   Other Instructions NA

## 2022-04-09 LAB — LIPID PANEL
Chol/HDL Ratio: 4 ratio (ref 0.0–4.4)
Cholesterol, Total: 259 mg/dL — ABNORMAL HIGH (ref 100–199)
HDL: 64 mg/dL (ref >39)
LDL Chol Calc (NIH): 155 mg/dL — ABNORMAL HIGH (ref 0–99)
Triglycerides: 218 mg/dL — ABNORMAL HIGH (ref 0–149)
VLDL Cholesterol Cal: 40 mg/dL (ref 5–40)

## 2022-04-15 ENCOUNTER — Telehealth: Payer: Self-pay

## 2022-04-15 DIAGNOSIS — E782 Mixed hyperlipidemia: Secondary | ICD-10-CM

## 2022-04-15 NOTE — Telephone Encounter (Signed)
Patient notified of the following per Dr. Raliegh Ip and agreed with plan. Referral sent. Patient aware she will be contacted by lipid clinic for an appt.

## 2022-04-15 NOTE — Telephone Encounter (Signed)
-----   Message from Park Liter, MD sent at 04/13/2022  1:27 PM EDT ----- Her cholesterol still acceptably high, if she accepted I would suggest to send her to lipid clinic

## 2022-05-18 ENCOUNTER — Ambulatory Visit: Payer: Medicare Other | Attending: Cardiology | Admitting: Pharmacist Clinician (PhC)/ Clinical Pharmacy Specialist

## 2022-05-18 DIAGNOSIS — E782 Mixed hyperlipidemia: Secondary | ICD-10-CM

## 2022-05-18 NOTE — Patient Instructions (Addendum)
Your Results:             Your most recent labs Goal  Total Cholesterol 259 < 200  Triglycerides 218 < 150  HDL (happy/good cholesterol) 64 > 40  LDL (lousy/bad cholesterol 155 < 70   Medication changes:  Re-start the Repatha 140 mg every 2 weeks.  Talk to [facility] clinic about getting your [employer] renewed before it runs out.    Lab orders:  We want to repeat labs after 2-3 months.  We will send you a lab order in January to remind you once we get closer to that time.    Patient Assistance:  The [employer] Well foundation offers assistance to help pay for medication copays.  They will cover copays for all cholesterol lowering meds, including statins, fibrates, omega-3 oils, ezetimibe, Repatha, Praluent, Nexletol, Nexlizet.  The cards are usually good for $2,500 or 12 months, whichever comes first. Go to healthwellfoundation.org Click on "Apply Now" Answer questions as to whom is applying (patient or representative) Your disease fund will be "hypercholesterolemia - Medicare access" They will ask questions about finances and which medications you are taking for cholesterol When you submit, the approval is usually within minutes.  You will need to print the card information from the site You will need to show this information to your pharmacy, they will bill your Medicare Part D plan first -then bill [employer] Well --for the copay.   You can also call them at [phone], although the hold times can be quite long.    High Triglycerides Eating Plan Triglycerides are a type of fat in the blood. High levels of triglycerides can increase your risk of heart disease and stroke. If your triglyceride levels are high, choosing the right foods can help lower your triglycerides and keep your heart healthy. Work with your health care provider or a dietitian to develop an eating plan that is right for you. What are tips for following this plan? General guidelines  Lose weight, if you are  overweight. For most people, losing 5-10 lb (2-5 kg) helps lower triglyceride levels. A weight-loss plan may include: 30 minutes of exercise at least 5 days a week. Reducing the amount of calories, sugar, and fat you eat. Eat a wide variety of fresh fruits, vegetables, and whole grains. These foods are high in fiber. Eat foods that contain healthy fats, such as fatty fish, nuts, seeds, and olive oil. Avoid foods that are high in added sugar, added salt (sodium), and saturated fat. Avoid low-fiber, refined carbohydrates such as white bread, crackers, noodles, and white rice. Avoid foods with trans fats or partially hydrogenated oils, such as fried foods or stick margarine. If you drink alcohol: Limit how much you have to: 0-1 drink a day for women who are not pregnant. 0-2 drinks a day for men. Your health care provider may recommend that you drink less than these amounts depending on your overall health. Know how much alcohol is in a drink. In the [place] one drink equals one 12 oz bottle of beer (355 mL), one 5 oz glass of wine (148 mL), or one 1 oz glass of hard liquor (44 mL). Reading food labels Check food labels for: The amount of saturated fat. Choose foods with no or very little saturated fat (less than 2 g). The amount of trans fat. Choose foods with no transfat. The amount of cholesterol. Choose foods that are low in cholesterol. The amount of sodium. Choose foods with less than  140 milligrams (mg) per serving. Shopping Buy dairy products labeled as nonfat (skim) or low-fat (1%). Avoid buying processed or prepackaged foods. These are often high in added sugar, sodium, and fat. Cooking Choose healthy fats when cooking, such as olive oil, avocado oil, or canola oil. Cook foods using lower fat methods, such as baking, broiling, boiling, or grilling. Make your own sauces, dressings, and marinades when possible, instead of buying them. Store-bought sauces, dressings, and marinades are  often high in sodium and sugar. Meal planning Eat more home-cooked food and less restaurant, buffet, and fast food. Eat fatty fish at least 2 times each week. Examples of fatty fish include salmon, trout, sardines, mackerel, tuna, and herring. If you eat whole eggs, do not eat more than 4 egg yolks per week. What foods should I eat? Fruits All fresh, canned (in natural juice), or frozen fruits. Vegetables Fresh or frozen vegetables. Low-sodium canned vegetables. Grains Whole wheat or whole grain breads, crackers, cereals, and pasta. Unsweetened oatmeal. Bulgur. Barley. Quinoa. Brown rice. Whole wheat flour tortillas. Meats and other proteins Skinless chicken or [place]. Ground chicken or [place]. Lean cuts of pork, trimmed of fat. Fish and seafood, especially salmon, trout, and herring. Egg whites. Dried beans, peas, or lentils. Unsalted nuts or seeds. Unsalted canned beans. Natural peanut or almond butter or other nut butters. Dairy Low-fat dairy products. Skim or low-fat (1%) milk. Reduced fat (2%) and low-sodium cheese. Low-fat ricotta cheese. Low-fat cottage cheese. Plain, low-fat yogurt. Fats and oils Tub margarine without trans fats. Light or reduced-fat mayonnaise. Light or reduced-fat salad dressings. Avocado. Safflower, olive, sunflower, soybean, and canola oils. The items listed above may not be a complete list of recommended foods and beverages. Talk with your dietitian about what dietary choices are best for you. What foods should I avoid? Fruits Sweetened dried fruit. Canned fruit in syrup. Fruit juice. Vegetables Creamed or fried vegetables. Vegetables in a cheese sauce. Grains White bread. White (regular) pasta. White rice. Cornbread. Bagels. Pastries. Crackers that contain trans fat. Meats and other proteins Fatty cuts of meat. Ribs. Chicken wings. [name]. Sausage. Bologna. Salami. Chitterlings. Fatback. Hot dogs. Bratwurst. Packaged lunch meats. Dairy Whole or reduced-fat  (2%) milk. Half-and-half. Cream cheese. Full-fat or sweetened yogurt. Full-fat cheese. Nondairy creamers. Whipped toppings. Processed cheese or cheese spreads. Cheese curds. Fats and oils Butter. Stick margarine. Lard. Shortening. Ghee. Bacon fat. Tropical oils, such as coconut, palm kernel, or palm oils. Beverages Alcohol. Sweetened drinks, such as soda, lemonade, fruit drinks, or punches. Sweets and desserts Corn syrup. Sugars. Honey. Molasses. Candy. Jam and jelly. Syrup. Sweetened cereals. Cookies. Pies. Cakes. Donuts. Muffins. Ice cream. Condiments Store-bought sauces, dressings, and marinades that are high in sugar, such as ketchup and barbecue sauce. The items listed above may not be a complete list of foods and beverages you should avoid. Talk with your dietitian about what dietary choices are best for you. Summary High levels of triglycerides can increase the risk of heart disease and stroke. Choosing the right foods can help lower your triglycerides. Eat plenty of fresh fruits, vegetables, and whole grains. Choose low-fat dairy and lean meats. Eat fatty fish at least twice a week. Avoid processed and prepackaged foods with added sugar, sodium, saturated fat, and trans fat. If you need suggestions or have questions about what types of food are good for you, talk with your health care provider or a dietitian. This information is not intended to replace advice given to you by your health care provider. Make  sure you discuss any questions you have with your health care provider. Document Revised: 11/14/2020 Document Reviewed: 11/14/2020 Elsevier Patient Education  [place] you for choosing [employer]

## 2022-05-18 NOTE — Progress Notes (Unsigned)
05/20/2022 [name] 1941-01-30 [id number]   HPI:  [name] is a 81 y.o. female patient of Dr Agustin Cree, who presents today for a lipid clinic evaluation.  See pertinent past medical history below.  She has been on Repatha in the past, however there was some confusion at her pharmacy in September - she was sent just 1 box instead of 3, and there was a delay in getting things corrected.  Unfortunately it was in that time frame, when she had not had a dose in about 6 weeks, that she went for her lipid labs.  She had previously not had any side effects or issues with using the [employer].    Previous CMM Key [id number]  Past Medical History: hypertension Controlled on lisiinopril hctz  CAD Coronary CT 2020 - 50-69% calcified plaque in the mid-LAD; Calcium score 189 (63 rd percentile)    Insurance Carrier:  [employer] Advantage Plan 3    ($47/150 - no discount for 3 mo supply)  Current Medications:  Repatha 852 mg sureclick      Cholesterol Goals: LDL < 70   Intolerant/previously tried:  lipitor       Lipid Panel     Component Value Date/Time   CHOL 259 (H) 04/08/2022 0836   TRIG 218 (H) 04/08/2022 0836   HDL 64 04/08/2022 0836   CHOLHDL 4.0 04/08/2022 0836   LDLCALC 155 (H) 04/08/2022 0836   LABVLDL 40 04/08/2022 0836     Current Outpatient Medications  Medication Sig Dispense Refill   aspirin 81 MG chewable tablet Chew 1 tablet (81 mg total) by mouth 2 (two) times daily. 60 tablet 1   calcium carbonate (OS-CAL) 600 MG TABS tablet Take 600 mg by mouth 2 (two) times daily with a meal.     cholecalciferol (VITAMIN D3) 25 MCG (1000 UNIT) tablet Take 1,000 Units by mouth daily.     Cyanocobalamin (VITAMIN B-12 SL) Place 2,500 mcg under the tongue daily.     diclofenac (VOLTAREN) 75 MG EC tablet Take 1 tablet by mouth 2 (two) times daily as needed for pain.     Evolocumab (REPATHA SURECLICK) 778 MG/ML SOAJ INJECT '140MG'$  INTO THE SKIN EVERY 14 DAYS.  2 mL 11   famotidine (PEPCID) 20 MG tablet Take 20 mg by mouth daily as needed for heartburn or indigestion.     KRILL OIL PO Take 2 capsules by mouth daily.     LATANOPROST OP Place 1 drop into both eyes daily.     levothyroxine (SYNTHROID) 88 MCG tablet Take 1 tablet by mouth daily.     lisinopril-hydrochlorothiazide (ZESTORETIC) 10-12.5 MG tablet Take 1 tablet by mouth daily.     ranolazine (RANEXA) 1000 MG SR tablet Take 1 tablet (1,000 mg total) by mouth 2 (two) times daily. 90 tablet 3   timolol (TIMOPTIC) 0.25 % ophthalmic solution Place 1 drop into both eyes every morning.     vitamin E 180 MG (400 UNITS) capsule Take 400 Units by mouth daily.     No current facility-administered medications for this visit.    Allergies  Allergen Reactions   Crestor [Rosuvastatin] Other (See Comments)    Myalgia   Statins Other (See Comments)    Myalgia   Potassium Penicillin G [Penicillin G] Rash    Past Medical History:  Diagnosis Date   Arthritis    Hyperlipemia    Hypertension    Hypertensive heart disease without heart failure    Hypothyroidism  Osteoarthritis of left knee 09/20/2017   Osteoarthritis of multiple joints    PONV (postoperative nausea and vomiting)    "years ago"   Primary osteoarthritis of right knee 03/11/2016   Statin intolerance    Wears glasses     There were no vitals taken for this visit.   Hyperlipemia Patient with CAD and LDL not currently at goal.  Will have her resume Repatha 140 mg q14 days.  Will need to renew her PA, but will have to repeat lipid labs.  In order to renew, must show benefit from [place] and unfortunately, she had labs done when out of medication for 6 weeks due to confusion with pharmacy.   Once we get 2-3 doses, will repeat labs to show benefit for PA renewal.     Tommy Medal PharmD CPP [place] [address] [place], [place] [address] [phone]

## 2022-05-20 ENCOUNTER — Encounter: Payer: Self-pay | Admitting: Pharmacist Clinician (PhC)/ Clinical Pharmacy Specialist

## 2022-05-20 DIAGNOSIS — E039 Hypothyroidism, unspecified: Secondary | ICD-10-CM | POA: Diagnosis not present

## 2022-05-20 DIAGNOSIS — Z79899 Other long term (current) drug therapy: Secondary | ICD-10-CM | POA: Diagnosis not present

## 2022-05-20 DIAGNOSIS — Z23 Encounter for immunization: Secondary | ICD-10-CM | POA: Diagnosis not present

## 2022-05-20 DIAGNOSIS — I700 Atherosclerosis of aorta: Secondary | ICD-10-CM | POA: Diagnosis not present

## 2022-05-20 DIAGNOSIS — I25118 Atherosclerotic heart disease of native coronary artery with other forms of angina pectoris: Secondary | ICD-10-CM | POA: Diagnosis not present

## 2022-05-20 DIAGNOSIS — N1831 Chronic kidney disease, stage 3a: Secondary | ICD-10-CM | POA: Diagnosis not present

## 2022-05-20 NOTE — Assessment & Plan Note (Signed)
Patient with CAD and LDL not currently at goal.  Will have her resume Repatha 140 mg q14 days.  Will need to renew her PA, but will have to repeat lipid labs.  In order to renew, must show benefit from [place] and unfortunately, she had labs done when out of medication for 6 weeks due to confusion with pharmacy.   Once we get 2-3 doses, will repeat labs to show benefit for PA renewal.

## 2022-06-09 DIAGNOSIS — R1111 Vomiting without nausea: Secondary | ICD-10-CM | POA: Diagnosis not present

## 2022-06-09 DIAGNOSIS — R6889 Other general symptoms and signs: Secondary | ICD-10-CM | POA: Diagnosis not present

## 2022-06-09 DIAGNOSIS — Z743 Need for continuous supervision: Secondary | ICD-10-CM | POA: Diagnosis not present

## 2022-06-09 DIAGNOSIS — R531 Weakness: Secondary | ICD-10-CM | POA: Diagnosis not present

## 2022-06-09 DIAGNOSIS — R197 Diarrhea, unspecified: Secondary | ICD-10-CM | POA: Diagnosis not present

## 2022-06-10 DIAGNOSIS — I700 Atherosclerosis of aorta: Secondary | ICD-10-CM | POA: Diagnosis not present

## 2022-06-10 DIAGNOSIS — K922 Gastrointestinal hemorrhage, unspecified: Secondary | ICD-10-CM | POA: Diagnosis not present

## 2022-06-10 DIAGNOSIS — K529 Noninfective gastroenteritis and colitis, unspecified: Secondary | ICD-10-CM | POA: Diagnosis not present

## 2022-06-10 DIAGNOSIS — Z880 Allergy status to penicillin: Secondary | ICD-10-CM | POA: Diagnosis not present

## 2022-06-10 DIAGNOSIS — N179 Acute kidney failure, unspecified: Secondary | ICD-10-CM | POA: Diagnosis not present

## 2022-06-10 DIAGNOSIS — D509 Iron deficiency anemia, unspecified: Secondary | ICD-10-CM | POA: Diagnosis not present

## 2022-06-10 DIAGNOSIS — I4891 Unspecified atrial fibrillation: Secondary | ICD-10-CM | POA: Diagnosis not present

## 2022-06-10 DIAGNOSIS — Z87442 Personal history of urinary calculi: Secondary | ICD-10-CM | POA: Diagnosis not present

## 2022-06-10 DIAGNOSIS — I10 Essential (primary) hypertension: Secondary | ICD-10-CM | POA: Diagnosis not present

## 2022-06-10 DIAGNOSIS — R7881 Bacteremia: Secondary | ICD-10-CM | POA: Diagnosis not present

## 2022-06-10 DIAGNOSIS — R6520 Severe sepsis without septic shock: Secondary | ICD-10-CM | POA: Diagnosis not present

## 2022-06-10 DIAGNOSIS — K5289 Other specified noninfective gastroenteritis and colitis: Secondary | ICD-10-CM | POA: Diagnosis not present

## 2022-06-10 DIAGNOSIS — A419 Sepsis, unspecified organism: Secondary | ICD-10-CM | POA: Diagnosis not present

## 2022-06-10 DIAGNOSIS — Z7982 Long term (current) use of aspirin: Secondary | ICD-10-CM | POA: Diagnosis not present

## 2022-06-10 DIAGNOSIS — Z79899 Other long term (current) drug therapy: Secondary | ICD-10-CM | POA: Diagnosis not present

## 2022-06-10 DIAGNOSIS — E876 Hypokalemia: Secondary | ICD-10-CM | POA: Diagnosis not present

## 2022-06-10 DIAGNOSIS — Z87440 Personal history of urinary (tract) infections: Secondary | ICD-10-CM | POA: Diagnosis not present

## 2022-06-10 DIAGNOSIS — D500 Iron deficiency anemia secondary to blood loss (chronic): Secondary | ICD-10-CM | POA: Diagnosis not present

## 2022-06-10 DIAGNOSIS — E861 Hypovolemia: Secondary | ICD-10-CM | POA: Diagnosis not present

## 2022-06-10 DIAGNOSIS — J9811 Atelectasis: Secondary | ICD-10-CM | POA: Diagnosis not present

## 2022-06-10 DIAGNOSIS — R9431 Abnormal electrocardiogram [ECG] [EKG]: Secondary | ICD-10-CM | POA: Diagnosis not present

## 2022-06-10 DIAGNOSIS — K921 Melena: Secondary | ICD-10-CM | POA: Diagnosis not present

## 2022-06-10 DIAGNOSIS — J45909 Unspecified asthma, uncomplicated: Secondary | ICD-10-CM | POA: Diagnosis not present

## 2022-06-10 DIAGNOSIS — R197 Diarrhea, unspecified: Secondary | ICD-10-CM | POA: Diagnosis not present

## 2022-06-10 DIAGNOSIS — K449 Diaphragmatic hernia without obstruction or gangrene: Secondary | ICD-10-CM | POA: Diagnosis not present

## 2022-06-10 DIAGNOSIS — I472 Ventricular tachycardia, unspecified: Secondary | ICD-10-CM | POA: Diagnosis not present

## 2022-06-10 DIAGNOSIS — I2510 Atherosclerotic heart disease of native coronary artery without angina pectoris: Secondary | ICD-10-CM | POA: Diagnosis not present

## 2022-06-10 DIAGNOSIS — I959 Hypotension, unspecified: Secondary | ICD-10-CM | POA: Diagnosis not present

## 2022-06-10 DIAGNOSIS — M1990 Unspecified osteoarthritis, unspecified site: Secondary | ICD-10-CM | POA: Diagnosis not present

## 2022-06-10 DIAGNOSIS — I252 Old myocardial infarction: Secondary | ICD-10-CM | POA: Diagnosis not present

## 2022-06-10 DIAGNOSIS — R109 Unspecified abdominal pain: Secondary | ICD-10-CM | POA: Diagnosis not present

## 2022-06-10 DIAGNOSIS — D72825 Bandemia: Secondary | ICD-10-CM | POA: Diagnosis not present

## 2022-06-10 DIAGNOSIS — E039 Hypothyroidism, unspecified: Secondary | ICD-10-CM | POA: Diagnosis not present

## 2022-06-11 DIAGNOSIS — R109 Unspecified abdominal pain: Secondary | ICD-10-CM | POA: Diagnosis not present

## 2022-06-11 DIAGNOSIS — I700 Atherosclerosis of aorta: Secondary | ICD-10-CM | POA: Diagnosis not present

## 2022-06-12 DIAGNOSIS — K529 Noninfective gastroenteritis and colitis, unspecified: Secondary | ICD-10-CM | POA: Diagnosis not present

## 2022-06-12 DIAGNOSIS — K921 Melena: Secondary | ICD-10-CM | POA: Diagnosis not present

## 2022-06-12 DIAGNOSIS — D500 Iron deficiency anemia secondary to blood loss (chronic): Secondary | ICD-10-CM | POA: Diagnosis not present

## 2022-06-12 DIAGNOSIS — I959 Hypotension, unspecified: Secondary | ICD-10-CM | POA: Diagnosis not present

## 2022-06-14 DIAGNOSIS — D509 Iron deficiency anemia, unspecified: Secondary | ICD-10-CM | POA: Diagnosis not present

## 2022-06-14 DIAGNOSIS — R7881 Bacteremia: Secondary | ICD-10-CM | POA: Diagnosis not present

## 2022-06-14 DIAGNOSIS — K529 Noninfective gastroenteritis and colitis, unspecified: Secondary | ICD-10-CM | POA: Diagnosis not present

## 2022-06-15 DIAGNOSIS — R7881 Bacteremia: Secondary | ICD-10-CM | POA: Diagnosis not present

## 2022-06-15 DIAGNOSIS — D509 Iron deficiency anemia, unspecified: Secondary | ICD-10-CM | POA: Diagnosis not present

## 2022-06-15 DIAGNOSIS — K529 Noninfective gastroenteritis and colitis, unspecified: Secondary | ICD-10-CM | POA: Diagnosis not present

## 2022-06-17 DIAGNOSIS — I119 Hypertensive heart disease without heart failure: Secondary | ICD-10-CM | POA: Diagnosis not present

## 2022-06-17 DIAGNOSIS — E7800 Pure hypercholesterolemia, unspecified: Secondary | ICD-10-CM | POA: Diagnosis not present

## 2022-06-17 DIAGNOSIS — E039 Hypothyroidism, unspecified: Secondary | ICD-10-CM | POA: Diagnosis not present

## 2022-07-06 ENCOUNTER — Ambulatory Visit: Payer: Medicare Other | Attending: Cardiology | Admitting: Cardiology

## 2022-07-06 ENCOUNTER — Encounter: Payer: Self-pay | Admitting: Cardiology

## 2022-07-06 VITALS — BP 110/56 | HR 92 | Ht 67.0 in | Wt 165.0 lb

## 2022-07-06 DIAGNOSIS — I10 Essential (primary) hypertension: Secondary | ICD-10-CM | POA: Diagnosis not present

## 2022-07-06 DIAGNOSIS — E782 Mixed hyperlipidemia: Secondary | ICD-10-CM | POA: Diagnosis not present

## 2022-07-06 DIAGNOSIS — I2510 Atherosclerotic heart disease of native coronary artery without angina pectoris: Secondary | ICD-10-CM | POA: Diagnosis not present

## 2022-07-06 NOTE — Progress Notes (Signed)
Cardiology Office Note:    Date:  07/06/2022   ID:  [name], DOB 03-Feb-1941, MRN [id number]  PCP:  [name], MD  Cardiologist:  Jenne Campus, MD    Referring MD: [name], MD   No chief complaint on file.   History of Present Illness:    [name] is a 81 y.o. female with past medical history significant for essential hypertension, dyslipidemia, intolerance to statin but now she is on PCSK9 agent tolerating well in 2020 she did have coronary CT angiogram which showed moderate disease in proximal and mid LAD with fractional flow reserve which was negative.  Last time I seen her she started talking of Having some chest pain.  We debated between cardiac catheterization medical therapy, she choose medical therapy, I augmented her therapy and since that time she is asymptomatic.  I brought her today to the office to see how she is doing, however in the meantime she end up going to the hospital because of GI upset she got diarrhea vomiting had to be resuscitated with fluid doing quite reasonable after that but still not after the par.  Denies have any chest pain tightness squeezing pressure burning chest  Past Medical History:  Diagnosis Date   Arthritis    Hyperlipemia    Hypertension    Hypertensive heart disease without heart failure    Hypothyroidism    Osteoarthritis of left knee 09/20/2017   Osteoarthritis of multiple joints    PONV (postoperative nausea and vomiting)    "years ago"   Primary osteoarthritis of right knee 03/11/2016   Statin intolerance    Wears glasses     Past Surgical History:  Procedure Laterality Date   ABDOMINAL HYSTERECTOMY  07/19/1976   ACHILLES TENDON SURGERY Right 06/27/2014   Procedure: RIGHT ACHILLES DEBRIDEMENT AND REPAIR WITH TRANSFER OF FLEXOR HALLUCIS LONGUS TENDON TO THE CALCANEUS  ;  Surgeon: [name], MD;  Location: [facility];  Service: Orthopedics;  Laterality: Right;   ACHILLES TENDON  SURGERY Left 11/28/2014   Procedure: LEFT ACHILLES DEBRIDEMENT AND REPAIR/GASTROC RECESSION;  Surgeon: [name], MD;  Location: [facility];  Service: Orthopedics;  Laterality: Left;   CATARACT EXTRACTION W/ INTRAOCULAR LENS IMPLANT Bilateral 2016   COLONOSCOPY     GASTROC RECESSION EXTREMITY Left 11/28/2014   Procedure: GASTROC RECESSION EXTREMITY;  Surgeon: [name], MD;  Location: [facility];  Service: Orthopedics;  Laterality: Left;   KNEE ARTHROPLASTY Right 03/11/2016   Procedure: RIGHT TOTAL KNEE ARTHROPLASTY WITH NAVIGATION;  Surgeon: Rod Can, MD;  Location: [facility];  Service: Orthopedics;  Laterality: Right;   TONSILLECTOMY      Current Medications: Current Meds  Medication Sig   aspirin EC 81 MG tablet Take 81 mg by mouth daily.   calcium carbonate (OS-CAL) 600 MG TABS tablet Take 600 mg by mouth 2 (two) times daily with a meal.   cholecalciferol (VITAMIN D3) 25 MCG (1000 UNIT) tablet Take 1,000 Units by mouth daily.   Cyanocobalamin (VITAMIN B-12 SL) Place 2,500 mcg under the tongue daily.   diclofenac (VOLTAREN) 75 MG EC tablet Take 1 tablet by mouth 2 (two) times daily as needed for pain.   Evolocumab (REPATHA SURECLICK) 893 MG/ML SOAJ INJECT '140MG'$  INTO THE SKIN EVERY 14 DAYS.   famotidine (PEPCID) 20 MG tablet Take 20 mg by mouth daily as needed for heartburn or indigestion.   KRILL OIL PO Take 2 capsules by mouth daily.   LATANOPROST  OP Place 1 drop into both eyes daily.   levothyroxine (SYNTHROID) 88 MCG tablet Take 1 tablet by mouth daily.   lisinopril-hydrochlorothiazide (ZESTORETIC) 10-12.5 MG tablet Take 1 tablet by mouth daily.   metoprolol tartrate (LOPRESSOR) 25 MG tablet Take 25 mg by mouth 2 (two) times daily.   ranolazine (RANEXA) 1000 MG SR tablet Take 1 tablet (1,000 mg total) by mouth 2 (two) times daily.   timolol (TIMOPTIC) 0.25 % ophthalmic solution Place 1 drop into both eyes every morning.   vitamin E 180 MG (400  UNITS) capsule Take 400 Units by mouth daily.     Allergies:   Crestor [rosuvastatin], Statins, and Potassium penicillin g [penicillin g]   Social History   Socioeconomic History   Marital status: Widowed    Spouse name: Not on file   Number of children: Not on file   Years of education: Not on file   Highest education level: Not on file  Occupational History   Not on file  Tobacco Use   Smoking status: Never   Smokeless tobacco: Never  Substance and Sexual Activity   Alcohol use: No   Drug use: No   Sexual activity: Not on file  Other Topics Concern   Not on file  Social History Narrative   Not on file   Social Determinants of Health   Financial Resource Strain: Not on file  Food Insecurity: Not on file  Transportation Needs: Not on file  Physical Activity: Not on file  Stress: Not on file  Social Connections: Not on file     Family History: The patient's family history includes Heart disease in her brother, father, maternal grandfather, paternal grandfather, and sister; Hypercholesterolemia in her brother and sister. ROS:   Please see the history of present illness.    All 14 point review of systems negative except as described per history of present illness  EKGs/Labs/Other Studies Reviewed:      Recent Labs: No results found for requested labs within last 365 days.  Recent Lipid Panel    Component Value Date/Time   CHOL 259 (H) 04/08/2022 0836   TRIG 218 (H) 04/08/2022 0836   HDL 64 04/08/2022 0836   CHOLHDL 4.0 04/08/2022 0836   LDLCALC 155 (H) 04/08/2022 0836    Physical Exam:    VS:  BP (!) 110/56   Pulse 92   Ht '5\' 7"'$  (1.702 m)   Wt 165 lb (74.8 kg)   SpO2 95%   BMI 25.84 kg/m     Wt Readings from Last 3 Encounters:  07/06/22 165 lb (74.8 kg)  04/08/22 173 lb (78.5 kg)  03/08/22 171 lb 3.2 oz (77.7 kg)     GEN:  Well nourished, well developed in no acute distress HEENT: Normal NECK: No JVD; No carotid bruits LYMPHATICS: No  lymphadenopathy CARDIAC: RRR, no murmurs, no rubs, no gallops RESPIRATORY:  Clear to auscultation without rales, wheezing or rhonchi  ABDOMEN: Soft, non-tender, non-distended MUSCULOSKELETAL:  No edema; No deformity  SKIN: Warm and dry LOWER EXTREMITIES: no swelling NEUROLOGIC:  Alert and oriented x 3 PSYCHIATRIC:  Normal affect   ASSESSMENT:    1. Coronary artery disease involving native coronary artery of native heart without angina pectoris   2. Primary hypertension   3. Mixed hyperlipidemia    PLAN:    In order of problems listed above:  Coronary disease stable from that point review we will continue antiplatelets therapy.  She was put on metoprolol while she was in  the hospital.  Apparently she also got a tarry stool will check CBC. Essential hypertension.  Blood pressure slightly on the lower side.  Will continue present management Paroxysmal atrial fibrillation she had only 1 documented episode of atrial fibrillation while she was in the hospital sick with GI problem.  Since that time none.  She is not anticoagulated she did have tarry stool will check CBC Mixed dyslipidemia intolerant to statin she is on Repatha. When I see her I will recheck her fasting lipid profile.  I want her to have some recovery from her current GI issue   Medication Adjustments/Labs and Tests Ordered: Current medicines are reviewed at length with the patient today.  Concerns regarding medicines are outlined above.  No orders of the defined types were placed in this encounter.  Medication changes: No orders of the defined types were placed in this encounter.   Signed, Park Liter, MD, [facility] 07/06/2022 2:08 PM    [facility]

## 2022-07-06 NOTE — Patient Instructions (Signed)
Medication Instructions:  Your physician recommends that you continue on your current medications as directed. Please refer to the Current Medication list given to you today.  *If you need a refill on your cardiac medications before your next appointment, please call your pharmacy*   Lab Work: H&H- today   Testing/Procedures: None Ordered   Follow-Up: At [facility], you and your health needs are our priority.  As part of our continuing mission to provide you with exceptional heart care, we have created designated Provider Care Teams.  These Care Teams include your primary Cardiologist (physician) and Advanced Practice Providers (APPs -  Physician Assistants and Nurse Practitioners) who all work together to provide you with the care you need, when you need it.  We recommend signing up for the patient portal called "MyChart".  Sign up information is provided on this After Visit Summary.  MyChart is used to connect with patients for Virtual Visits (Telemedicine).  Patients are able to view lab/test results, encounter notes, upcoming appointments, etc.  Non-urgent messages can be sent to your provider as well.   To learn more about what you can do with MyChart, go to [email or web address].    Your next appointment:   3 month(s)  The format for your next appointment:   In Person  Provider:   Jenne Campus, MD    Other Instructions NA

## 2022-07-06 NOTE — Addendum Note (Signed)
Addended by: Jacobo Forest D on: 07/06/2022 02:22 PM   Modules accepted: Orders

## 2022-07-07 LAB — HEMOGLOBIN AND HEMATOCRIT, BLOOD
Hematocrit: 31.1 % — ABNORMAL LOW (ref 34.0–46.6)
Hemoglobin: 10 g/dL — ABNORMAL LOW (ref 11.1–15.9)

## 2022-07-08 ENCOUNTER — Telehealth: Payer: Self-pay

## 2022-07-08 ENCOUNTER — Ambulatory Visit: Payer: Medicare Other | Admitting: Cardiology

## 2022-07-08 NOTE — Telephone Encounter (Signed)
Results reviewed with pt as per Dr. Krasowski's note.  Pt verbalized understanding and had no additional questions. Routed to PCP  

## 2022-07-16 DIAGNOSIS — I951 Orthostatic hypotension: Secondary | ICD-10-CM | POA: Diagnosis not present

## 2022-07-16 DIAGNOSIS — I119 Hypertensive heart disease without heart failure: Secondary | ICD-10-CM | POA: Diagnosis not present

## 2022-07-21 DIAGNOSIS — K529 Noninfective gastroenteritis and colitis, unspecified: Secondary | ICD-10-CM | POA: Diagnosis not present

## 2022-07-21 DIAGNOSIS — N179 Acute kidney failure, unspecified: Secondary | ICD-10-CM | POA: Diagnosis not present

## 2022-07-21 DIAGNOSIS — A419 Sepsis, unspecified organism: Secondary | ICD-10-CM | POA: Diagnosis not present

## 2022-07-21 DIAGNOSIS — I480 Paroxysmal atrial fibrillation: Secondary | ICD-10-CM | POA: Diagnosis not present

## 2022-07-21 DIAGNOSIS — I959 Hypotension, unspecified: Secondary | ICD-10-CM | POA: Diagnosis not present

## 2022-07-21 DIAGNOSIS — R300 Dysuria: Secondary | ICD-10-CM | POA: Diagnosis not present

## 2022-07-22 DIAGNOSIS — R300 Dysuria: Secondary | ICD-10-CM | POA: Diagnosis not present

## 2022-07-26 DIAGNOSIS — K649 Unspecified hemorrhoids: Secondary | ICD-10-CM | POA: Diagnosis not present

## 2022-07-26 DIAGNOSIS — R1032 Left lower quadrant pain: Secondary | ICD-10-CM | POA: Diagnosis not present

## 2022-07-26 DIAGNOSIS — K529 Noninfective gastroenteritis and colitis, unspecified: Secondary | ICD-10-CM | POA: Diagnosis not present

## 2022-07-26 DIAGNOSIS — K219 Gastro-esophageal reflux disease without esophagitis: Secondary | ICD-10-CM | POA: Diagnosis not present

## 2022-07-26 DIAGNOSIS — K5900 Constipation, unspecified: Secondary | ICD-10-CM | POA: Diagnosis not present

## 2022-07-30 DIAGNOSIS — I10 Essential (primary) hypertension: Secondary | ICD-10-CM | POA: Diagnosis not present

## 2022-07-30 DIAGNOSIS — K921 Melena: Secondary | ICD-10-CM | POA: Diagnosis not present

## 2022-07-30 DIAGNOSIS — Z880 Allergy status to penicillin: Secondary | ICD-10-CM | POA: Diagnosis not present

## 2022-07-30 DIAGNOSIS — I252 Old myocardial infarction: Secondary | ICD-10-CM | POA: Diagnosis not present

## 2022-07-30 DIAGNOSIS — K644 Residual hemorrhoidal skin tags: Secondary | ICD-10-CM | POA: Diagnosis not present

## 2022-07-30 DIAGNOSIS — Z79899 Other long term (current) drug therapy: Secondary | ICD-10-CM | POA: Diagnosis not present

## 2022-07-30 DIAGNOSIS — Z86010 Personal history of colonic polyps: Secondary | ICD-10-CM | POA: Diagnosis not present

## 2022-07-30 DIAGNOSIS — K529 Noninfective gastroenteritis and colitis, unspecified: Secondary | ICD-10-CM | POA: Diagnosis not present

## 2022-07-30 DIAGNOSIS — J45909 Unspecified asthma, uncomplicated: Secondary | ICD-10-CM | POA: Diagnosis not present

## 2022-07-30 DIAGNOSIS — Z7982 Long term (current) use of aspirin: Secondary | ICD-10-CM | POA: Diagnosis not present

## 2022-07-30 DIAGNOSIS — R1032 Left lower quadrant pain: Secondary | ICD-10-CM | POA: Diagnosis not present

## 2022-07-30 DIAGNOSIS — E039 Hypothyroidism, unspecified: Secondary | ICD-10-CM | POA: Diagnosis not present

## 2022-07-30 DIAGNOSIS — Z888 Allergy status to other drugs, medicaments and biological substances status: Secondary | ICD-10-CM | POA: Diagnosis not present

## 2022-07-30 DIAGNOSIS — Z09 Encounter for follow-up examination after completed treatment for conditions other than malignant neoplasm: Secondary | ICD-10-CM | POA: Diagnosis not present

## 2022-08-04 DIAGNOSIS — I959 Hypotension, unspecified: Secondary | ICD-10-CM | POA: Diagnosis not present

## 2022-08-04 DIAGNOSIS — N3001 Acute cystitis with hematuria: Secondary | ICD-10-CM | POA: Diagnosis not present

## 2022-08-04 DIAGNOSIS — E7800 Pure hypercholesterolemia, unspecified: Secondary | ICD-10-CM | POA: Diagnosis not present

## 2022-08-27 DIAGNOSIS — Z0000 Encounter for general adult medical examination without abnormal findings: Secondary | ICD-10-CM | POA: Diagnosis not present

## 2022-08-27 DIAGNOSIS — Z79899 Other long term (current) drug therapy: Secondary | ICD-10-CM | POA: Diagnosis not present

## 2022-08-27 DIAGNOSIS — R7302 Impaired glucose tolerance (oral): Secondary | ICD-10-CM | POA: Diagnosis not present

## 2022-08-27 DIAGNOSIS — N1832 Chronic kidney disease, stage 3b: Secondary | ICD-10-CM | POA: Diagnosis not present

## 2022-08-27 DIAGNOSIS — E039 Hypothyroidism, unspecified: Secondary | ICD-10-CM | POA: Diagnosis not present

## 2022-08-27 DIAGNOSIS — E7800 Pure hypercholesterolemia, unspecified: Secondary | ICD-10-CM | POA: Diagnosis not present

## 2022-08-27 DIAGNOSIS — N3090 Cystitis, unspecified without hematuria: Secondary | ICD-10-CM | POA: Diagnosis not present

## 2022-09-16 DIAGNOSIS — E7800 Pure hypercholesterolemia, unspecified: Secondary | ICD-10-CM | POA: Diagnosis not present

## 2022-09-16 DIAGNOSIS — E039 Hypothyroidism, unspecified: Secondary | ICD-10-CM | POA: Diagnosis not present

## 2022-09-16 DIAGNOSIS — I119 Hypertensive heart disease without heart failure: Secondary | ICD-10-CM | POA: Diagnosis not present

## 2022-09-24 DIAGNOSIS — N3090 Cystitis, unspecified without hematuria: Secondary | ICD-10-CM | POA: Diagnosis not present

## 2022-09-24 DIAGNOSIS — K219 Gastro-esophageal reflux disease without esophagitis: Secondary | ICD-10-CM | POA: Diagnosis not present

## 2022-09-24 DIAGNOSIS — K5732 Diverticulitis of large intestine without perforation or abscess without bleeding: Secondary | ICD-10-CM | POA: Diagnosis not present

## 2022-09-24 DIAGNOSIS — E7800 Pure hypercholesterolemia, unspecified: Secondary | ICD-10-CM | POA: Diagnosis not present

## 2022-09-24 DIAGNOSIS — I119 Hypertensive heart disease without heart failure: Secondary | ICD-10-CM | POA: Diagnosis not present

## 2022-09-25 DIAGNOSIS — K5732 Diverticulitis of large intestine without perforation or abscess without bleeding: Secondary | ICD-10-CM | POA: Diagnosis not present

## 2022-09-27 DIAGNOSIS — M2550 Pain in unspecified joint: Secondary | ICD-10-CM | POA: Diagnosis not present

## 2022-10-05 ENCOUNTER — Other Ambulatory Visit: Payer: Self-pay | Admitting: Cardiology

## 2022-10-21 ENCOUNTER — Encounter: Payer: Self-pay | Admitting: Cardiology

## 2022-10-21 ENCOUNTER — Ambulatory Visit: Payer: Medicare Other | Attending: Cardiology | Admitting: Cardiology

## 2022-10-21 VITALS — BP 130/74 | HR 95 | Ht 67.0 in | Wt 161.8 lb

## 2022-10-21 DIAGNOSIS — I10 Essential (primary) hypertension: Secondary | ICD-10-CM | POA: Diagnosis not present

## 2022-10-21 DIAGNOSIS — E782 Mixed hyperlipidemia: Secondary | ICD-10-CM

## 2022-10-21 DIAGNOSIS — Z789 Other specified health status: Secondary | ICD-10-CM

## 2022-10-21 DIAGNOSIS — I2510 Atherosclerotic heart disease of native coronary artery without angina pectoris: Secondary | ICD-10-CM

## 2022-10-21 NOTE — Patient Instructions (Signed)

## 2022-10-21 NOTE — Progress Notes (Signed)
Cardiology Office Note:    Date:  10/21/2022   ID:  [name], DOB 23-Dec-1940, MRN [id number]  PCP:  [name], MD  Cardiologist:  Jenne Campus, MD    Referring MD: [name], MD   Chief Complaint  Patient presents with   Follow-up  Doing fine  History of Present Illness:    [name] is a 82 y.o. female with past medical history significant for essential hypertension, dyslipidemia, intolerance to statin but now she is on PCSK9 agent tolerating quite well, in 2020 she had coronary CT angiogram which showed moderate stenosis of proximal mid LAD, fractional flow reserve was negative.  She is being managed with medication with quite good success.  She comes today to months for follow-up.  Overall doing very well.  She denies have any chest pain tightness squeezing pressure burning chest no palpitation dizziness swelling of lower extremities.  Past Medical History:  Diagnosis Date   Arthritis    Hyperlipemia    Hypertension    Hypertensive heart disease without heart failure    Hypothyroidism    Osteoarthritis of left knee 09/20/2017   Osteoarthritis of multiple joints    PONV (postoperative nausea and vomiting)    "years ago"   Primary osteoarthritis of right knee 03/11/2016   Statin intolerance    Wears glasses     Past Surgical History:  Procedure Laterality Date   ABDOMINAL HYSTERECTOMY  07/19/1976   ACHILLES TENDON SURGERY Right 06/27/2014   Procedure: RIGHT ACHILLES DEBRIDEMENT AND REPAIR WITH TRANSFER OF FLEXOR HALLUCIS LONGUS TENDON TO THE CALCANEUS  ;  Surgeon: [name], MD;  Location: [place];  Service: Orthopedics;  Laterality: Right;   ACHILLES TENDON SURGERY Left 11/28/2014   Procedure: LEFT ACHILLES DEBRIDEMENT AND REPAIR/GASTROC RECESSION;  Surgeon: [name], MD;  Location: [place];  Service: Orthopedics;  Laterality: Left;   CATARACT EXTRACTION W/ INTRAOCULAR LENS IMPLANT Bilateral 2016    COLONOSCOPY     GASTROC RECESSION EXTREMITY Left 11/28/2014   Procedure: GASTROC RECESSION EXTREMITY;  Surgeon: [name], MD;  Location: [place];  Service: Orthopedics;  Laterality: Left;   KNEE ARTHROPLASTY Right 03/11/2016   Procedure: RIGHT TOTAL KNEE ARTHROPLASTY WITH NAVIGATION;  Surgeon: Rod Can, MD;  Location: [facility];  Service: Orthopedics;  Laterality: Right;   TONSILLECTOMY      Current Medications: Current Meds  Medication Sig   aspirin EC 81 MG tablet Take 81 mg by mouth daily.   calcium carbonate (OS-CAL) 600 MG TABS tablet Take 600 mg by mouth 2 (two) times daily with a meal.   cholecalciferol (VITAMIN D3) 25 MCG (1000 UNIT) tablet Take 1,000 Units by mouth daily.   Cyanocobalamin (VITAMIN B-12 SL) Place 2,500 mcg under the tongue daily.   diclofenac (VOLTAREN) 75 MG EC tablet Take 1 tablet by mouth 2 (two) times daily as needed for pain.   Evolocumab (REPATHA SURECLICK) XX123456 MG/ML SOAJ INJECT 140MG  INTO THE SKIN EVERY 14 DAYS. (Patient taking differently: Inject 140 mg into the skin every 14 (fourteen) days. INJECT 140MG  INTO THE SKIN EVERY 14 DAYS.)   famotidine (PEPCID) 20 MG tablet Take 20 mg by mouth daily as needed for heartburn or indigestion.   KRILL OIL PO Take 2 capsules by mouth daily.   LATANOPROST OP Place 1 drop into both eyes daily.   levothyroxine (SYNTHROID) 88 MCG tablet Take 1 tablet by mouth daily.   ranolazine (RANEXA) 1000 MG SR tablet TAKE 1  TABLET BY MOUTH TWICE DAILY   timolol (TIMOPTIC) 0.25 % ophthalmic solution Place 1 drop into both eyes every morning.   vitamin E 180 MG (400 UNITS) capsule Take 400 Units by mouth daily.   [DISCONTINUED] lisinopril-hydrochlorothiazide (ZESTORETIC) 10-12.5 MG tablet Take 1 tablet by mouth daily.   [DISCONTINUED] metoprolol tartrate (LOPRESSOR) 25 MG tablet Take 25 mg by mouth 2 (two) times daily.     Allergies:   Crestor [rosuvastatin], Statins, and Potassium penicillin g [penicillin  g]   Social History   Socioeconomic History   Marital status: Widowed    Spouse name: Not on file   Number of children: Not on file   Years of education: Not on file   Highest education level: Not on file  Occupational History   Not on file  Tobacco Use   Smoking status: Never   Smokeless tobacco: Never  Substance and Sexual Activity   Alcohol use: No   Drug use: No   Sexual activity: Not on file  Other Topics Concern   Not on file  Social History Narrative   Not on file   Social Determinants of Health   Financial Resource Strain: Not on file  Food Insecurity: Not on file  Transportation Needs: Not on file  Physical Activity: Not on file  Stress: Not on file  Social Connections: Not on file     Family History: The patient's family history includes Heart disease in her brother, father, maternal grandfather, paternal grandfather, and sister; Hypercholesterolemia in her brother and sister. ROS:   Please see the history of present illness.    All 14 point review of systems negative except as described per history of present illness  EKGs/Labs/Other Studies Reviewed:      Recent Labs: 07/06/2022: Hemoglobin 10.0  Recent Lipid Panel    Component Value Date/Time   CHOL 259 (H) 04/08/2022 0836   TRIG 218 (H) 04/08/2022 0836   HDL 64 04/08/2022 0836   CHOLHDL 4.0 04/08/2022 0836   LDLCALC 155 (H) 04/08/2022 0836    Physical Exam:    VS:  BP 130/74 (BP Location: Left Arm, Patient Position: Sitting)   Pulse 95   Ht 5\' 7"  (1.702 m)   Wt 161 lb 12.8 oz (73.4 kg)   SpO2 95%   BMI 25.34 kg/m     Wt Readings from Last 3 Encounters:  10/21/22 161 lb 12.8 oz (73.4 kg)  07/06/22 165 lb (74.8 kg)  04/08/22 173 lb (78.5 kg)     GEN:  Well nourished, well developed in no acute distress HEENT: Normal NECK: No JVD; No carotid bruits LYMPHATICS: No lymphadenopathy CARDIAC: RRR, no murmurs, no rubs, no gallops RESPIRATORY:  Clear to auscultation without rales,  wheezing or rhonchi  ABDOMEN: Soft, non-tender, non-distended MUSCULOSKELETAL:  No edema; No deformity  SKIN: Warm and dry LOWER EXTREMITIES: no swelling NEUROLOGIC:  Alert and oriented x 3 PSYCHIATRIC:  Normal affect   ASSESSMENT:    1. Coronary artery disease involving native coronary artery of native heart without angina pectoris   2. Primary hypertension   3. Mixed hyperlipidemia   4. Statin intolerance    PLAN:    In order of problems listed above:  Coronary disease stable from that point review on antiplatelet therapy and antianginal therapy which I will continue. Essential hypertension blood pressure well-controlled continue present management. Mixed dyslipidemia with intolerance to statin.  She is on Repatha which I will continue I did review K PN which show me her LDL  of 155 HDL 64 however this is from September of last year she tells me that recently she get fasting lipid profile done by primary care physician looks good.  Will call the office try to get a copy of it   Medication Adjustments/Labs and Tests Ordered: Current medicines are reviewed at length with the patient today.  Concerns regarding medicines are outlined above.  No orders of the defined types were placed in this encounter.  Medication changes: No orders of the defined types were placed in this encounter.   Signed, Park Liter, MD, [facility] 10/21/2022 3:43 PM    [place]

## 2022-10-22 DIAGNOSIS — I209 Angina pectoris, unspecified: Secondary | ICD-10-CM | POA: Diagnosis not present

## 2022-10-22 DIAGNOSIS — I700 Atherosclerosis of aorta: Secondary | ICD-10-CM | POA: Diagnosis not present

## 2022-10-22 DIAGNOSIS — K5732 Diverticulitis of large intestine without perforation or abscess without bleeding: Secondary | ICD-10-CM | POA: Diagnosis not present

## 2022-10-22 DIAGNOSIS — N3000 Acute cystitis without hematuria: Secondary | ICD-10-CM | POA: Diagnosis not present

## 2022-10-22 DIAGNOSIS — R1032 Left lower quadrant pain: Secondary | ICD-10-CM | POA: Diagnosis not present

## 2022-10-30 DIAGNOSIS — R1032 Left lower quadrant pain: Secondary | ICD-10-CM | POA: Diagnosis not present

## 2022-10-30 DIAGNOSIS — R197 Diarrhea, unspecified: Secondary | ICD-10-CM | POA: Diagnosis not present

## 2022-10-30 DIAGNOSIS — Z79899 Other long term (current) drug therapy: Secondary | ICD-10-CM | POA: Diagnosis not present

## 2022-11-06 DIAGNOSIS — M2550 Pain in unspecified joint: Secondary | ICD-10-CM | POA: Diagnosis not present

## 2022-11-06 DIAGNOSIS — M26629 Arthralgia of temporomandibular joint, unspecified side: Secondary | ICD-10-CM | POA: Diagnosis not present

## 2022-11-22 DIAGNOSIS — N3001 Acute cystitis with hematuria: Secondary | ICD-10-CM | POA: Diagnosis not present

## 2022-11-22 DIAGNOSIS — N3091 Cystitis, unspecified with hematuria: Secondary | ICD-10-CM | POA: Diagnosis not present

## 2022-12-07 DIAGNOSIS — H524 Presbyopia: Secondary | ICD-10-CM | POA: Diagnosis not present

## 2022-12-07 DIAGNOSIS — H43393 Other vitreous opacities, bilateral: Secondary | ICD-10-CM | POA: Diagnosis not present

## 2022-12-07 DIAGNOSIS — Z961 Presence of intraocular lens: Secondary | ICD-10-CM | POA: Diagnosis not present

## 2022-12-07 DIAGNOSIS — H26491 Other secondary cataract, right eye: Secondary | ICD-10-CM | POA: Diagnosis not present

## 2022-12-08 DIAGNOSIS — R1032 Left lower quadrant pain: Secondary | ICD-10-CM | POA: Diagnosis not present

## 2022-12-08 DIAGNOSIS — R109 Unspecified abdominal pain: Secondary | ICD-10-CM | POA: Diagnosis not present

## 2022-12-08 DIAGNOSIS — K8689 Other specified diseases of pancreas: Secondary | ICD-10-CM | POA: Diagnosis not present

## 2022-12-08 DIAGNOSIS — N200 Calculus of kidney: Secondary | ICD-10-CM | POA: Diagnosis not present

## 2022-12-14 DIAGNOSIS — H401123 Primary open-angle glaucoma, left eye, severe stage: Secondary | ICD-10-CM | POA: Diagnosis not present

## 2022-12-14 DIAGNOSIS — H26491 Other secondary cataract, right eye: Secondary | ICD-10-CM | POA: Diagnosis not present

## 2022-12-14 DIAGNOSIS — Z961 Presence of intraocular lens: Secondary | ICD-10-CM | POA: Diagnosis not present

## 2022-12-14 DIAGNOSIS — H524 Presbyopia: Secondary | ICD-10-CM | POA: Diagnosis not present

## 2022-12-17 DIAGNOSIS — E039 Hypothyroidism, unspecified: Secondary | ICD-10-CM | POA: Diagnosis not present

## 2022-12-17 DIAGNOSIS — E7800 Pure hypercholesterolemia, unspecified: Secondary | ICD-10-CM | POA: Diagnosis not present

## 2022-12-17 DIAGNOSIS — I119 Hypertensive heart disease without heart failure: Secondary | ICD-10-CM | POA: Diagnosis not present

## 2022-12-24 DIAGNOSIS — H26491 Other secondary cataract, right eye: Secondary | ICD-10-CM | POA: Diagnosis not present

## 2022-12-24 DIAGNOSIS — Z961 Presence of intraocular lens: Secondary | ICD-10-CM | POA: Diagnosis not present

## 2022-12-24 DIAGNOSIS — Z6824 Body mass index (BMI) 24.0-24.9, adult: Secondary | ICD-10-CM | POA: Diagnosis not present

## 2022-12-24 DIAGNOSIS — R1032 Left lower quadrant pain: Secondary | ICD-10-CM | POA: Diagnosis not present

## 2022-12-24 DIAGNOSIS — N3000 Acute cystitis without hematuria: Secondary | ICD-10-CM | POA: Diagnosis not present

## 2022-12-24 DIAGNOSIS — H401123 Primary open-angle glaucoma, left eye, severe stage: Secondary | ICD-10-CM | POA: Diagnosis not present

## 2022-12-24 DIAGNOSIS — K5909 Other constipation: Secondary | ICD-10-CM | POA: Diagnosis not present

## 2022-12-24 DIAGNOSIS — D692 Other nonthrombocytopenic purpura: Secondary | ICD-10-CM | POA: Diagnosis not present

## 2022-12-24 DIAGNOSIS — H524 Presbyopia: Secondary | ICD-10-CM | POA: Diagnosis not present

## 2022-12-24 DIAGNOSIS — E039 Hypothyroidism, unspecified: Secondary | ICD-10-CM | POA: Diagnosis not present

## 2022-12-29 DIAGNOSIS — N3000 Acute cystitis without hematuria: Secondary | ICD-10-CM | POA: Diagnosis not present

## 2023-02-01 DIAGNOSIS — I480 Paroxysmal atrial fibrillation: Secondary | ICD-10-CM | POA: Diagnosis not present

## 2023-02-01 DIAGNOSIS — D692 Other nonthrombocytopenic purpura: Secondary | ICD-10-CM | POA: Diagnosis not present

## 2023-02-01 DIAGNOSIS — R1032 Left lower quadrant pain: Secondary | ICD-10-CM | POA: Diagnosis not present

## 2023-02-01 DIAGNOSIS — N1831 Chronic kidney disease, stage 3a: Secondary | ICD-10-CM | POA: Diagnosis not present

## 2023-02-01 DIAGNOSIS — I25118 Atherosclerotic heart disease of native coronary artery with other forms of angina pectoris: Secondary | ICD-10-CM | POA: Diagnosis not present

## 2023-02-01 DIAGNOSIS — I700 Atherosclerosis of aorta: Secondary | ICD-10-CM | POA: Diagnosis not present

## 2023-02-01 DIAGNOSIS — Z6824 Body mass index (BMI) 24.0-24.9, adult: Secondary | ICD-10-CM | POA: Diagnosis not present

## 2023-02-14 DIAGNOSIS — K529 Noninfective gastroenteritis and colitis, unspecified: Secondary | ICD-10-CM | POA: Diagnosis not present

## 2023-02-14 DIAGNOSIS — K5900 Constipation, unspecified: Secondary | ICD-10-CM | POA: Diagnosis not present

## 2023-02-14 DIAGNOSIS — K649 Unspecified hemorrhoids: Secondary | ICD-10-CM | POA: Diagnosis not present

## 2023-02-14 DIAGNOSIS — R1032 Left lower quadrant pain: Secondary | ICD-10-CM | POA: Diagnosis not present

## 2023-02-14 DIAGNOSIS — K219 Gastro-esophageal reflux disease without esophagitis: Secondary | ICD-10-CM | POA: Diagnosis not present

## 2023-03-18 DIAGNOSIS — H401123 Primary open-angle glaucoma, left eye, severe stage: Secondary | ICD-10-CM | POA: Diagnosis not present

## 2023-03-18 DIAGNOSIS — H524 Presbyopia: Secondary | ICD-10-CM | POA: Diagnosis not present

## 2023-03-18 DIAGNOSIS — Z961 Presence of intraocular lens: Secondary | ICD-10-CM | POA: Diagnosis not present

## 2023-03-18 DIAGNOSIS — H26491 Other secondary cataract, right eye: Secondary | ICD-10-CM | POA: Diagnosis not present

## 2023-03-28 DIAGNOSIS — K649 Unspecified hemorrhoids: Secondary | ICD-10-CM | POA: Diagnosis not present

## 2023-03-28 DIAGNOSIS — K219 Gastro-esophageal reflux disease without esophagitis: Secondary | ICD-10-CM | POA: Diagnosis not present

## 2023-03-28 DIAGNOSIS — K529 Noninfective gastroenteritis and colitis, unspecified: Secondary | ICD-10-CM | POA: Diagnosis not present

## 2023-03-28 DIAGNOSIS — R109 Unspecified abdominal pain: Secondary | ICD-10-CM | POA: Diagnosis not present

## 2023-03-28 DIAGNOSIS — K5900 Constipation, unspecified: Secondary | ICD-10-CM | POA: Diagnosis not present

## 2023-03-28 DIAGNOSIS — K259 Gastric ulcer, unspecified as acute or chronic, without hemorrhage or perforation: Secondary | ICD-10-CM | POA: Diagnosis not present

## 2023-03-30 DIAGNOSIS — I5022 Chronic systolic (congestive) heart failure: Secondary | ICD-10-CM | POA: Diagnosis not present

## 2023-03-30 DIAGNOSIS — R933 Abnormal findings on diagnostic imaging of other parts of digestive tract: Secondary | ICD-10-CM | POA: Diagnosis not present

## 2023-03-30 DIAGNOSIS — E785 Hyperlipidemia, unspecified: Secondary | ICD-10-CM | POA: Diagnosis not present

## 2023-03-30 DIAGNOSIS — H409 Unspecified glaucoma: Secondary | ICD-10-CM | POA: Diagnosis not present

## 2023-03-30 DIAGNOSIS — K921 Melena: Secondary | ICD-10-CM | POA: Diagnosis not present

## 2023-03-30 DIAGNOSIS — J45909 Unspecified asthma, uncomplicated: Secondary | ICD-10-CM | POA: Diagnosis not present

## 2023-03-30 DIAGNOSIS — D649 Anemia, unspecified: Secondary | ICD-10-CM | POA: Diagnosis not present

## 2023-03-30 DIAGNOSIS — E039 Hypothyroidism, unspecified: Secondary | ICD-10-CM | POA: Diagnosis not present

## 2023-03-30 DIAGNOSIS — A419 Sepsis, unspecified organism: Secondary | ICD-10-CM | POA: Diagnosis not present

## 2023-03-30 DIAGNOSIS — R000 Tachycardia, unspecified: Secondary | ICD-10-CM | POA: Diagnosis not present

## 2023-03-30 DIAGNOSIS — I10 Essential (primary) hypertension: Secondary | ICD-10-CM | POA: Diagnosis not present

## 2023-03-30 DIAGNOSIS — K760 Fatty (change of) liver, not elsewhere classified: Secondary | ICD-10-CM | POA: Diagnosis not present

## 2023-03-30 DIAGNOSIS — R42 Dizziness and giddiness: Secondary | ICD-10-CM | POA: Diagnosis not present

## 2023-03-30 DIAGNOSIS — R1110 Vomiting, unspecified: Secondary | ICD-10-CM | POA: Diagnosis not present

## 2023-03-30 DIAGNOSIS — K219 Gastro-esophageal reflux disease without esophagitis: Secondary | ICD-10-CM | POA: Diagnosis not present

## 2023-03-30 DIAGNOSIS — R140 Abdominal distension (gaseous): Secondary | ICD-10-CM | POA: Diagnosis not present

## 2023-03-30 DIAGNOSIS — R112 Nausea with vomiting, unspecified: Secondary | ICD-10-CM | POA: Diagnosis not present

## 2023-03-30 DIAGNOSIS — M1990 Unspecified osteoarthritis, unspecified site: Secondary | ICD-10-CM | POA: Diagnosis not present

## 2023-03-30 DIAGNOSIS — K259 Gastric ulcer, unspecified as acute or chronic, without hemorrhage or perforation: Secondary | ICD-10-CM | POA: Diagnosis not present

## 2023-03-30 DIAGNOSIS — K920 Hematemesis: Secondary | ICD-10-CM | POA: Diagnosis not present

## 2023-03-30 DIAGNOSIS — Z743 Need for continuous supervision: Secondary | ICD-10-CM | POA: Diagnosis not present

## 2023-03-30 DIAGNOSIS — D500 Iron deficiency anemia secondary to blood loss (chronic): Secondary | ICD-10-CM | POA: Diagnosis not present

## 2023-03-30 DIAGNOSIS — Z79899 Other long term (current) drug therapy: Secondary | ICD-10-CM | POA: Diagnosis not present

## 2023-03-30 DIAGNOSIS — K449 Diaphragmatic hernia without obstruction or gangrene: Secondary | ICD-10-CM | POA: Diagnosis not present

## 2023-03-30 DIAGNOSIS — R58 Hemorrhage, not elsewhere classified: Secondary | ICD-10-CM | POA: Diagnosis not present

## 2023-03-30 DIAGNOSIS — R5383 Other fatigue: Secondary | ICD-10-CM | POA: Diagnosis not present

## 2023-03-30 DIAGNOSIS — I2510 Atherosclerotic heart disease of native coronary artery without angina pectoris: Secondary | ICD-10-CM | POA: Diagnosis not present

## 2023-03-30 DIAGNOSIS — Z7982 Long term (current) use of aspirin: Secondary | ICD-10-CM | POA: Diagnosis not present

## 2023-03-30 DIAGNOSIS — I252 Old myocardial infarction: Secondary | ICD-10-CM | POA: Diagnosis not present

## 2023-03-30 DIAGNOSIS — R109 Unspecified abdominal pain: Secondary | ICD-10-CM | POA: Diagnosis not present

## 2023-03-30 DIAGNOSIS — I4891 Unspecified atrial fibrillation: Secondary | ICD-10-CM | POA: Diagnosis not present

## 2023-03-30 DIAGNOSIS — K8689 Other specified diseases of pancreas: Secondary | ICD-10-CM | POA: Diagnosis not present

## 2023-03-30 DIAGNOSIS — N200 Calculus of kidney: Secondary | ICD-10-CM | POA: Diagnosis not present

## 2023-03-30 DIAGNOSIS — R9389 Abnormal findings on diagnostic imaging of other specified body structures: Secondary | ICD-10-CM | POA: Diagnosis not present

## 2023-03-31 DIAGNOSIS — K8689 Other specified diseases of pancreas: Secondary | ICD-10-CM | POA: Diagnosis not present

## 2023-03-31 DIAGNOSIS — I4891 Unspecified atrial fibrillation: Secondary | ICD-10-CM | POA: Diagnosis not present

## 2023-03-31 DIAGNOSIS — I5022 Chronic systolic (congestive) heart failure: Secondary | ICD-10-CM | POA: Diagnosis not present

## 2023-03-31 DIAGNOSIS — K259 Gastric ulcer, unspecified as acute or chronic, without hemorrhage or perforation: Secondary | ICD-10-CM | POA: Diagnosis not present

## 2023-03-31 DIAGNOSIS — A419 Sepsis, unspecified organism: Secondary | ICD-10-CM | POA: Diagnosis not present

## 2023-03-31 DIAGNOSIS — K920 Hematemesis: Secondary | ICD-10-CM | POA: Diagnosis not present

## 2023-03-31 DIAGNOSIS — D1809 Hemangioma of other sites: Secondary | ICD-10-CM | POA: Diagnosis not present

## 2023-03-31 DIAGNOSIS — I10 Essential (primary) hypertension: Secondary | ICD-10-CM | POA: Diagnosis not present

## 2023-03-31 DIAGNOSIS — N281 Cyst of kidney, acquired: Secondary | ICD-10-CM | POA: Diagnosis not present

## 2023-03-31 DIAGNOSIS — K449 Diaphragmatic hernia without obstruction or gangrene: Secondary | ICD-10-CM | POA: Diagnosis not present

## 2023-04-01 DIAGNOSIS — I5022 Chronic systolic (congestive) heart failure: Secondary | ICD-10-CM | POA: Diagnosis not present

## 2023-04-01 DIAGNOSIS — K920 Hematemesis: Secondary | ICD-10-CM | POA: Diagnosis not present

## 2023-04-01 DIAGNOSIS — A419 Sepsis, unspecified organism: Secondary | ICD-10-CM | POA: Diagnosis not present

## 2023-04-01 DIAGNOSIS — I4891 Unspecified atrial fibrillation: Secondary | ICD-10-CM | POA: Diagnosis not present

## 2023-04-01 DIAGNOSIS — K259 Gastric ulcer, unspecified as acute or chronic, without hemorrhage or perforation: Secondary | ICD-10-CM | POA: Diagnosis not present

## 2023-04-06 ENCOUNTER — Other Ambulatory Visit: Payer: Self-pay | Admitting: Cardiology

## 2023-04-08 DIAGNOSIS — Z6824 Body mass index (BMI) 24.0-24.9, adult: Secondary | ICD-10-CM | POA: Diagnosis not present

## 2023-04-08 DIAGNOSIS — I4891 Unspecified atrial fibrillation: Secondary | ICD-10-CM | POA: Diagnosis not present

## 2023-04-08 DIAGNOSIS — Z7689 Persons encountering health services in other specified circumstances: Secondary | ICD-10-CM | POA: Diagnosis not present

## 2023-04-08 DIAGNOSIS — K8689 Other specified diseases of pancreas: Secondary | ICD-10-CM | POA: Diagnosis not present

## 2023-04-08 DIAGNOSIS — K922 Gastrointestinal hemorrhage, unspecified: Secondary | ICD-10-CM | POA: Diagnosis not present

## 2023-04-15 ENCOUNTER — Ambulatory Visit: Payer: Medicare Other | Attending: Cardiology | Admitting: Cardiology

## 2023-04-15 ENCOUNTER — Ambulatory Visit: Payer: Medicare Other | Attending: Cardiology

## 2023-04-15 ENCOUNTER — Encounter: Payer: Self-pay | Admitting: Cardiology

## 2023-04-15 VITALS — BP 142/70 | HR 83 | Ht 66.5 in | Wt 153.6 lb

## 2023-04-15 DIAGNOSIS — E782 Mixed hyperlipidemia: Secondary | ICD-10-CM

## 2023-04-15 DIAGNOSIS — I499 Cardiac arrhythmia, unspecified: Secondary | ICD-10-CM

## 2023-04-15 DIAGNOSIS — I2510 Atherosclerotic heart disease of native coronary artery without angina pectoris: Secondary | ICD-10-CM

## 2023-04-15 DIAGNOSIS — I10 Essential (primary) hypertension: Secondary | ICD-10-CM

## 2023-04-15 NOTE — Addendum Note (Signed)
Addended by: Baldo Ash D on: 04/15/2023 02:11 PM   Modules accepted: Orders

## 2023-04-15 NOTE — Progress Notes (Signed)
Cardiology Office Note:    Date:  04/15/2023   ID:  [name], DOB 06-23-1941, MRN [id number]  PCP:  Stacey Malta, MD  Cardiologist:  Gypsy Balsam, MD    Referring MD: Stacey Malta, MD   Chief Complaint  Patient presents with   [facility] follow up    History of Present Illness:    [name] is a 82 y.o. female  with past medical history significant for essential hypertension, dyslipidemia, intolerance to statin but now she is on PCSK9 agent tolerating well in 2020 she did have coronary CT angiogram which showed moderate disease in proximal and mid LAD with fractional flow reserve which was negative. Last time I seen her she started talking of Having some chest pain. We debated between cardiac catheterization medical therapy, she choose medical therapy,  Comes today to months for regular follow-up.  Overall she is doing well but recently she ended up in the hospital because of GI bleeding EGD was done she was find to have 5 ulcers.  Proton pump inhibitor she was put on, her nonsteroidal anti-inflammatory medication had been withdrawn and she seems to be doing fine now.  Interestingly when she was there she was told to have atrial fibrillation but have no documentation of this I did review her chart very carefully find also some information about atrial fibrillation November when she was in the hospital with GI bleeding the same kind of story.  She did not feel her atrial fibrillation she does not think she had any.  Past Medical History:  Diagnosis Date   Arthritis    Hyperlipemia    Hypertension    Hypertensive heart disease without heart failure    Hypothyroidism    Osteoarthritis of left knee 09/20/2017   Osteoarthritis of multiple joints    PONV (postoperative nausea and vomiting)    "years ago"   Primary osteoarthritis of right knee 03/11/2016   Statin intolerance    Wears glasses     Past Surgical History:  Procedure Laterality Date   ABDOMINAL  HYSTERECTOMY  07/19/1976   ACHILLES TENDON SURGERY Right 06/27/2014   Procedure: RIGHT ACHILLES DEBRIDEMENT AND REPAIR WITH TRANSFER OF FLEXOR HALLUCIS LONGUS TENDON TO THE CALCANEUS  ;  Surgeon: Toni Arthurs, MD;  Location: [facility];  Service: Orthopedics;  Laterality: Right;   ACHILLES TENDON SURGERY Left 11/28/2014   Procedure: LEFT ACHILLES DEBRIDEMENT AND REPAIR/GASTROC RECESSION;  Surgeon: Toni Arthurs, MD;  Location: [facility];  Service: Orthopedics;  Laterality: Left;   CATARACT EXTRACTION W/ INTRAOCULAR LENS IMPLANT Bilateral 2016   COLONOSCOPY     GASTROC RECESSION EXTREMITY Left 11/28/2014   Procedure: GASTROC RECESSION EXTREMITY;  Surgeon: Toni Arthurs, MD;  [facility];  Service: Orthopedics;  Laterality: Left;   KNEE ARTHROPLASTY Right 03/11/2016   Procedure: RIGHT TOTAL KNEE ARTHROPLASTY WITH NAVIGATION;  Surgeon: Samson Frederic, MD;  Location: [facility];  Service: Orthopedics;  Laterality: Right;   TONSILLECTOMY      Current Medications: Current Meds  Medication Sig   calcium carbonate (OS-CAL) 600 MG TABS tablet Take 600 mg by mouth 2 (two) times daily with a meal.   cholecalciferol (VITAMIN D3) 25 MCG (1000 UNIT) tablet Take 1,000 Units by mouth daily.   Cyanocobalamin (VITAMIN B-12 SL) Place 2,500 mcg under the tongue daily.   Evolocumab (REPATHA SURECLICK) 140 MG/ML SOAJ INJECT 140MG  INTO THE SKIN EVERY 14 DAYS. (Patient taking differently: Inject 140 mg into the skin every 14 (  fourteen) days. INJECT 140MG  INTO THE SKIN EVERY 14 DAYS.)   KRILL OIL PO Take 2 capsules by mouth daily.   LATANOPROST OP Place 1 drop into both eyes daily.   levothyroxine (SYNTHROID) 88 MCG tablet Take 1 tablet by mouth daily.   ranolazine (RANEXA) 1000 MG SR tablet TAKE 1 TABLET BY MOUTH TWICE DAILY   timolol (TIMOPTIC) 0.25 % ophthalmic solution Place 1 drop into both eyes every morning.   vitamin E 180 MG (400 UNITS) capsule Take 400 Units  by mouth daily.   [DISCONTINUED] aspirin EC 81 MG tablet Take 81 mg by mouth daily.   [DISCONTINUED] diclofenac (VOLTAREN) 75 MG EC tablet Take 1 tablet by mouth 2 (two) times daily as needed for pain.   [DISCONTINUED] famotidine (PEPCID) 20 MG tablet Take 20 mg by mouth daily as needed for heartburn or indigestion.     Allergies:   Crestor [rosuvastatin], Statins, and Potassium penicillin g [penicillin g]   Social History   Socioeconomic History   Marital status: Widowed    Spouse name: Not on file   Number of children: Not on file   Years of education: Not on file   Highest education level: Not on file  Occupational History   Not on file  Tobacco Use   Smoking status: Never   Smokeless tobacco: Never  Substance and Sexual Activity   Alcohol use: No   Drug use: No   Sexual activity: Not on file  Other Topics Concern   Not on file  Social History Narrative   Not on file   Social Determinants of Health   Financial Resource Strain: Not on file  Food Insecurity: Not on file  Transportation Needs: Not on file  Physical Activity: Not on file  Stress: Not on file  Social Connections: Not on file     Family History: The patient's family history includes Heart disease in her brother, father, maternal grandfather, paternal grandfather, and sister; Hypercholesterolemia in her brother and sister. ROS:   Please see the history of present illness.    All 14 point review of systems negative except as described per history of present illness  EKGs/Labs/Other Studies Reviewed:    EKG Interpretation Date/Time:  Friday April 15 2023 13:47:27 EDT Ventricular Rate:  83 PR Interval:  172 QRS Duration:  84 QT Interval:  386 QTC Calculation: 453 R Axis:   -21  Text Interpretation: Normal sinus rhythm Low voltage QRS Nonspecific ST and T wave abnormality Abnormal ECG When compared with ECG of 08-Mar-2022 08:25, Premature ventricular complexes are no longer Present Right bundle  branch block is no longer Present Confirmed by Gypsy Balsam [phone]) on 04/15/2023 2:00:41 PM    Recent Labs: 07/06/2022: Hemoglobin 10.0  Recent Lipid Panel    Component Value Date/Time   CHOL 259 (H) 04/08/2022 0836   TRIG 218 (H) 04/08/2022 0836   HDL 64 04/08/2022 0836   CHOLHDL 4.0 04/08/2022 0836   LDLCALC 155 (H) 04/08/2022 0836    Physical Exam:    VS:  BP (!) 142/70 (BP Location: Left Arm, Patient Position: Sitting)   Pulse 83   Ht 5' 6.5" (1.689 m)   Wt 153 lb 9.6 oz (69.7 kg)   SpO2 94%   BMI 24.42 kg/m     Wt Readings from Last 3 Encounters:  04/15/23 153 lb 9.6 oz (69.7 kg)  10/21/22 161 lb 12.8 oz (73.4 kg)  07/06/22 165 lb (74.8 kg)     GEN:  Well nourished,  well developed in no acute distress HEENT: Normal NECK: No JVD; No carotid bruits LYMPHATICS: No lymphadenopathy CARDIAC: RRR, no murmurs, no rubs, no gallops RESPIRATORY:  Clear to auscultation without rales, wheezing or rhonchi  ABDOMEN: Soft, non-tender, non-distended MUSCULOSKELETAL:  No edema; No deformity  SKIN: Warm and Kirby LOWER EXTREMITIES: no swelling NEUROLOGIC:  Alert and oriented x 3 PSYCHIATRIC:  Normal affect   ASSESSMENT:    1. Coronary artery disease involving native coronary artery of native heart without angina pectoris   2. Primary hypertension   3. Mixed hyperlipidemia    PLAN:    In order of problems listed above:  Coronary disease stable from that point if not on antiplatelets therapy because of recent history of GI bleeding with requiring a blood transfusion, Essential hypertension blood pressure slightly elevated today but she said at home always good.  Continue monitoring. Mixed dyslipidemia she is on Repatha which I will continue. He questionable atrial fibrillation.  Now she is not a candidate for anticoagulation because of recent GI bleed still has have some question about if she truly have atrial fibrillation will put Zio patch on her for 2 weeks to see if  she has any recurrences of this if she does have recurrences then we need to talk about potential Watchman device   Medication Adjustments/Labs and Tests Ordered: Current medicines are reviewed at length with the patient today.  Concerns regarding medicines are outlined above.  Orders Placed This Encounter  Procedures   EKG 12-Lead   Medication changes: No orders of the defined types were placed in this encounter.   Signed, Georgeanna Lea, MD, [facility] 04/15/2023 2:01 PM    [facility]

## 2023-04-15 NOTE — Patient Instructions (Signed)
Medication Instructions:  Your physician recommends that you continue on your current medications as directed. Please refer to the Current Medication list given to you today.  *If you need a refill on your cardiac medications before your next appointment, please call your pharmacy*   Lab Work: None Ordered If you have labs (blood work) drawn today and your tests are completely normal, you will receive your results only by: MyChart Message (if you have MyChart) OR A paper copy in the mail If you have any lab test that is abnormal or we need to change your treatment, we will call you to review the results.   Testing/Procedures:  WHY IS MY DOCTOR PRESCRIBING ZIO? The Zio system is proven and trusted by physicians to detect and diagnose irregular heart rhythms -- and has been prescribed to hundreds of thousands of patients.  The FDA has cleared the Zio system to monitor for many different kinds of irregular heart rhythms. In a study, physicians were able to reach a diagnosis 90% of the time with the Zio system1.  You can wear the Zio monitor -- a small, discreet, comfortable patch -- during your normal day-to-day activity, including while you sleep, shower, and exercise, while it records every single heartbeat for analysis.  1Barrett, P., et al. Comparison of 24 Hour Holter Monitoring Versus 14 Day Novel Adhesive Patch Electrocardiographic Monitoring. American Journal of Medicine, 2014.  ZIO VS. HOLTER MONITORING The Zio monitor can be comfortably worn for up to 14 days. Holter monitors can be worn for 24 to 48 hours, limiting the time to record any irregular heart rhythms you may have. Zio is able to capture data for the 51% of patients who have their first symptom-triggered arrhythmia after 48 hours.1  LIVE WITHOUT RESTRICTIONS The Zio ambulatory cardiac monitor is a small, unobtrusive, and water-resistant patch--you might even forget you're wearing it. The Zio monitor records and stores  every beat of your heart, whether you're sleeping, working out, or showering.     Follow-Up: At [facility], you and your health needs are our priority.  As part of our continuing mission to provide you with exceptional heart care, we have created designated Provider Care Teams.  These Care Teams include your primary Cardiologist (physician) and Advanced Practice Providers (APPs -  Physician Assistants and Nurse Practitioners) who all work together to provide you with the care you need, when you need it.  We recommend signing up for the patient portal called "MyChart".  Sign up information is provided on this After Visit Summary.  MyChart is used to connect with patients for Virtual Visits (Telemedicine).  Patients are able to view lab/test results, encounter notes, upcoming appointments, etc.  Non-urgent messages can be sent to your provider as well.   To learn more about what you can do with MyChart, go to [email or web address]://[email or web address].com.    Your next appointment:   4 month(s)  The format for your next appointment:   In Person  Provider:   Robert Krasowski, MD    Other Instructions NA  

## 2023-04-21 DIAGNOSIS — M51369 Other intervertebral disc degeneration, lumbar region without mention of lumbar back pain or lower extremity pain: Secondary | ICD-10-CM | POA: Diagnosis not present

## 2023-04-21 DIAGNOSIS — M545 Low back pain, unspecified: Secondary | ICD-10-CM | POA: Diagnosis not present

## 2023-04-21 DIAGNOSIS — M47816 Spondylosis without myelopathy or radiculopathy, lumbar region: Secondary | ICD-10-CM | POA: Insufficient documentation

## 2023-05-02 DIAGNOSIS — K529 Noninfective gastroenteritis and colitis, unspecified: Secondary | ICD-10-CM | POA: Diagnosis not present

## 2023-05-02 DIAGNOSIS — D649 Anemia, unspecified: Secondary | ICD-10-CM | POA: Diagnosis not present

## 2023-05-02 DIAGNOSIS — K5900 Constipation, unspecified: Secondary | ICD-10-CM | POA: Diagnosis not present

## 2023-05-02 DIAGNOSIS — K649 Unspecified hemorrhoids: Secondary | ICD-10-CM | POA: Diagnosis not present

## 2023-05-02 DIAGNOSIS — K259 Gastric ulcer, unspecified as acute or chronic, without hemorrhage or perforation: Secondary | ICD-10-CM | POA: Diagnosis not present

## 2023-05-03 ENCOUNTER — Other Ambulatory Visit: Payer: Self-pay | Admitting: Cardiology

## 2023-05-03 DIAGNOSIS — I498 Other specified cardiac arrhythmias: Secondary | ICD-10-CM | POA: Diagnosis not present

## 2023-05-11 DIAGNOSIS — H16143 Punctate keratitis, bilateral: Secondary | ICD-10-CM | POA: Diagnosis not present

## 2023-05-11 DIAGNOSIS — H26491 Other secondary cataract, right eye: Secondary | ICD-10-CM | POA: Diagnosis not present

## 2023-05-11 DIAGNOSIS — H43393 Other vitreous opacities, bilateral: Secondary | ICD-10-CM | POA: Diagnosis not present

## 2023-05-11 DIAGNOSIS — H524 Presbyopia: Secondary | ICD-10-CM | POA: Diagnosis not present

## 2023-05-11 DIAGNOSIS — Z961 Presence of intraocular lens: Secondary | ICD-10-CM | POA: Diagnosis not present

## 2023-05-18 ENCOUNTER — Telehealth: Payer: Self-pay

## 2023-05-18 DIAGNOSIS — I472 Ventricular tachycardia, unspecified: Secondary | ICD-10-CM

## 2023-05-18 NOTE — Telephone Encounter (Signed)
LVM with Monitor results per Dr. Vanetta Shawl note. Ordered Echo per note.

## 2023-05-23 DIAGNOSIS — H26491 Other secondary cataract, right eye: Secondary | ICD-10-CM | POA: Diagnosis not present

## 2023-05-23 DIAGNOSIS — H524 Presbyopia: Secondary | ICD-10-CM | POA: Diagnosis not present

## 2023-05-23 DIAGNOSIS — H43393 Other vitreous opacities, bilateral: Secondary | ICD-10-CM | POA: Diagnosis not present

## 2023-05-23 DIAGNOSIS — H401111 Primary open-angle glaucoma, right eye, mild stage: Secondary | ICD-10-CM | POA: Diagnosis not present

## 2023-05-23 DIAGNOSIS — S0501XA Injury of conjunctiva and corneal abrasion without foreign body, right eye, initial encounter: Secondary | ICD-10-CM | POA: Diagnosis not present

## 2023-05-23 DIAGNOSIS — Z961 Presence of intraocular lens: Secondary | ICD-10-CM | POA: Diagnosis not present

## 2023-05-25 DIAGNOSIS — H401111 Primary open-angle glaucoma, right eye, mild stage: Secondary | ICD-10-CM | POA: Diagnosis not present

## 2023-05-25 DIAGNOSIS — H401123 Primary open-angle glaucoma, left eye, severe stage: Secondary | ICD-10-CM | POA: Diagnosis not present

## 2023-05-26 DIAGNOSIS — M545 Low back pain, unspecified: Secondary | ICD-10-CM | POA: Diagnosis not present

## 2023-06-03 DIAGNOSIS — H401123 Primary open-angle glaucoma, left eye, severe stage: Secondary | ICD-10-CM | POA: Diagnosis not present

## 2023-06-06 DIAGNOSIS — M51369 Other intervertebral disc degeneration, lumbar region without mention of lumbar back pain or lower extremity pain: Secondary | ICD-10-CM | POA: Diagnosis not present

## 2023-06-06 DIAGNOSIS — M47816 Spondylosis without myelopathy or radiculopathy, lumbar region: Secondary | ICD-10-CM | POA: Diagnosis not present

## 2023-06-14 DIAGNOSIS — M545 Low back pain, unspecified: Secondary | ICD-10-CM | POA: Diagnosis not present

## 2023-06-23 DIAGNOSIS — M545 Low back pain, unspecified: Secondary | ICD-10-CM | POA: Diagnosis not present

## 2023-06-28 DIAGNOSIS — M545 Low back pain, unspecified: Secondary | ICD-10-CM | POA: Diagnosis not present

## 2023-06-29 ENCOUNTER — Ambulatory Visit: Payer: Medicare Other | Attending: Cardiology

## 2023-06-29 DIAGNOSIS — I5030 Unspecified diastolic (congestive) heart failure: Secondary | ICD-10-CM | POA: Diagnosis not present

## 2023-06-29 DIAGNOSIS — I472 Ventricular tachycardia, unspecified: Secondary | ICD-10-CM | POA: Diagnosis not present

## 2023-06-29 DIAGNOSIS — I517 Cardiomegaly: Secondary | ICD-10-CM

## 2023-06-29 DIAGNOSIS — I080 Rheumatic disorders of both mitral and aortic valves: Secondary | ICD-10-CM

## 2023-06-29 LAB — ECHOCARDIOGRAM COMPLETE
Area-P 1/2: 4.06 cm2
MV M vel: 5.63 m/s
MV Peak grad: 126.8 mm[Hg]
P 1/2 time: 662 ms
Radius: 0.3 cm
S' Lateral: 3.4 cm

## 2023-07-04 DIAGNOSIS — M51369 Other intervertebral disc degeneration, lumbar region without mention of lumbar back pain or lower extremity pain: Secondary | ICD-10-CM | POA: Diagnosis not present

## 2023-07-04 DIAGNOSIS — M5459 Other low back pain: Secondary | ICD-10-CM | POA: Diagnosis not present

## 2023-07-04 DIAGNOSIS — M47816 Spondylosis without myelopathy or radiculopathy, lumbar region: Secondary | ICD-10-CM | POA: Diagnosis not present

## 2023-07-15 ENCOUNTER — Telehealth: Payer: Self-pay

## 2023-07-15 DIAGNOSIS — I10 Essential (primary) hypertension: Secondary | ICD-10-CM

## 2023-07-15 MED ORDER — ENTRESTO 24-26 MG PO TABS: 1.0000 | ORAL_TABLET | Freq: Two times a day (BID) | ORAL | 3 refills | Status: DC

## 2023-07-15 NOTE — Telephone Encounter (Signed)
Left message on My Chart with Echo results per Dr. Vanetta Shawl note. Sent Entresto 24-26 Bid to pharmacy, BMP ordered for 1 week.  Routed to PCP.

## 2023-07-16 ENCOUNTER — Ambulatory Visit (HOSPITAL_BASED_OUTPATIENT_CLINIC_OR_DEPARTMENT_OTHER)
Admission: EM | Admit: 2023-07-16 | Discharge: 2023-07-16 | Disposition: A | Discharge status: Home / Self Care | Payer: Medicare Other | Attending: Internal Medicine | Admitting: Internal Medicine

## 2023-07-16 ENCOUNTER — Encounter (HOSPITAL_BASED_OUTPATIENT_CLINIC_OR_DEPARTMENT_OTHER): Payer: Self-pay | Admitting: Emergency Medicine

## 2023-07-16 DIAGNOSIS — B349 Viral infection, unspecified: Secondary | ICD-10-CM

## 2023-07-16 MED ORDER — PROMETHAZINE-DM 6.25-15 MG/5ML PO SYRP: 5.0000 mL | ORAL_SOLUTION | Freq: Four times a day (QID) | ORAL | 0 refills | Status: AC | PRN

## 2023-07-16 NOTE — ED Triage Notes (Signed)
Pt c/o coughing, congestion x 1 day.

## 2023-07-16 NOTE — ED Provider Notes (Signed)
[facility]    CSN: [id number] Arrival date & time: 07/16/23  1009      History   Chief Complaint Chief Complaint  Patient presents with   Cough    HPI [name] is a 82 y.o. female.    Cough Associated symptoms: rhinorrhea and sore throat   Associated symptoms: no chest pain, no chills, no ear pain, no fever, no headaches, no shortness of breath and no wheezing   Nasal congestion, rhinorrhea and cough since yesterday concerned she may have bronchitis.  Denies fever, chills, sweats, chest pain, shortness of breath, abdominal pain, nausea, vomiting, diarrhea, earache.  Had sore throat several days ago resolved.  Denies close contacts with illness.   Past Medical History:  Diagnosis Date   Arthritis    Hyperlipemia    Hypertension    Hypertensive heart disease without heart failure    Hypothyroidism    Osteoarthritis of left knee 09/20/2017   Osteoarthritis of multiple joints    PONV (postoperative nausea and vomiting)    "years ago"   Primary osteoarthritis of right knee 03/11/2016   Statin intolerance    Wears glasses     Patient Active Problem List   Diagnosis Date Noted   Low back pain 04/01/2022   Hypertensive heart disease without heart failure 02/25/2022   Osteoarthritis of multiple joints 02/25/2022   Statin intolerance 02/25/2022   Trochanteric bursitis of right hip 02/08/2022   Coronary artery disease involving native coronary artery of native heart without angina pectoris 10/24/2019   Mixed hyperlipidemia 10/24/2019   Wears glasses    PONV (postoperative nausea and vomiting)    Hypothyroidism    Hypertension    Arthritis    Hyperlipemia    Osteoarthritis of left knee 09/20/2017   Primary osteoarthritis of right knee 03/11/2016    Past Surgical History:  Procedure Laterality Date   ABDOMINAL HYSTERECTOMY  07/19/1976   ACHILLES TENDON SURGERY Right 06/27/2014   Procedure: RIGHT ACHILLES DEBRIDEMENT AND REPAIR WITH TRANSFER OF  FLEXOR HALLUCIS LONGUS TENDON TO THE CALCANEUS  ;  Surgeon: Toni Arthurs, MD;  Location: [facility];  Service: Orthopedics;  Laterality: Right;   ACHILLES TENDON SURGERY Left 11/28/2014   Procedure: LEFT ACHILLES DEBRIDEMENT AND REPAIR/GASTROC RECESSION;  Surgeon: Toni Arthurs, MD;  Location: [facility];  Service: Orthopedics;  Laterality: Left;   CATARACT EXTRACTION W/ INTRAOCULAR LENS IMPLANT Bilateral 2016   COLONOSCOPY     GASTROC RECESSION EXTREMITY Left 11/28/2014   Procedure: GASTROC RECESSION EXTREMITY;  Surgeon: Toni Arthurs, MD;  Location: [facility];  Service: Orthopedics;  Laterality: Left;   KNEE ARTHROPLASTY Right 03/11/2016   Procedure: RIGHT TOTAL KNEE ARTHROPLASTY WITH NAVIGATION;  Surgeon: Samson Frederic, MD;  Location: [facility];  Service: Orthopedics;  Laterality: Right;   TONSILLECTOMY      OB History   No obstetric history on file.      Home Medications    Prior to Admission medications   Medication Sig Start Date End Date Taking? Authorizing Provider  LATANOPROST OP Place 1 drop into both eyes daily.   Yes [provider]  levothyroxine (SYNTHROID) 88 MCG tablet Take 1 tablet by mouth daily.   Yes [provider]  promethazine-dextromethorphan (PROMETHAZINE-DM) 6.25-15 MG/5ML syrup Take 5 mLs by mouth 4 (four) times daily as needed for up to 5 days for cough. 07/16/23 07/21/23 Yes Meliton Rattan, PA  calcium carbonate (OS-CAL) 600 MG TABS tablet Take 600 mg by mouth  2 (two) times daily with a meal.    [provider]  cholecalciferol (VITAMIN D3) 25 MCG (1000 UNIT) tablet Take 1,000 Units by mouth daily.    [provider]  Cyanocobalamin (VITAMIN B-12 SL) Place 2,500 mcg under the tongue daily.    [provider]  Evolocumab (REPATHA SURECLICK) 140 MG/ML SOAJ INJECT 140MG  INTO THE SKIN EVERY 14 DAYS. Patient taking differently: Inject 140 mg into the skin every 14 (fourteen) days.  INJECT 140MG  INTO THE SKIN EVERY 14 DAYS. 08/06/20   Tobb, Kardie, DO  KRILL OIL PO Take 2 capsules by mouth daily.    [provider]  ranolazine (RANEXA) 1000 MG SR tablet Take 1 tablet (1,000 mg total) by mouth 2 (two) times daily. 05/03/23   Georgeanna Lea, MD  sacubitril-valsartan (ENTRESTO) 24-26 MG Take 1 tablet by mouth 2 (two) times daily. 07/15/23   Georgeanna Lea, MD  timolol (TIMOPTIC) 0.25 % ophthalmic solution Place 1 drop into both eyes every morning. 03/19/22   [provider]  vitamin E 180 MG (400 UNITS) capsule Take 400 Units by mouth daily.    [provider]    Family History Family History  Problem Relation Age of Onset   Heart disease Father    Heart disease Sister    Hypercholesterolemia Sister    Heart disease Brother    Hypercholesterolemia Brother    Heart disease Maternal Grandfather    Heart disease Paternal Grandfather     Social History Social History   Tobacco Use   Smoking status: Never   Smokeless tobacco: Never  Substance Use Topics   Alcohol use: No   Drug use: No     Allergies   Crestor [rosuvastatin], Statins, and Potassium penicillin g [penicillin g]   Review of Systems Review of Systems  Constitutional:  Positive for fatigue. Negative for appetite change, chills and fever.  HENT:  Positive for congestion, rhinorrhea and sore throat. Negative for ear pain and sinus pressure.   Respiratory:  Positive for cough. Negative for shortness of breath and wheezing.   Cardiovascular:  Negative for chest pain.  Gastrointestinal:  Negative for diarrhea, nausea and vomiting.  Neurological:  Negative for dizziness and headaches.     Physical Exam Triage Vital Signs ED Triage Vitals  Encounter Vitals Group     BP 07/16/23 1120 (!) 167/82     Systolic BP Percentile --      Diastolic BP Percentile --      Pulse Rate 07/16/23 1120 85     Resp 07/16/23 1120 18     Temp 07/16/23 1120 98.2 F (36.8 C)      Temp Source 07/16/23 1120 Oral     SpO2 07/16/23 1120 95 %     Weight --      Height --      Head Circumference --      Peak Flow --      Pain Score 07/16/23 1117 0     Pain Loc --      Pain Education --      Exclude from Growth Chart --    No data found.  Updated Vital Signs BP (!) 167/82 (BP Location: Right Arm)   Pulse 85   Temp 98.2 F (36.8 C) (Oral)   Resp 18   SpO2 95%   Visual Acuity Right Eye Distance:   Left Eye Distance:   Bilateral Distance:    Right Eye Near:   Left Eye Near:  Bilateral Near:     Physical Exam Vitals and nursing note reviewed.  Constitutional:      Appearance: She is not ill-appearing.  HENT:     Head: Normocephalic and atraumatic.     Right Ear: Tympanic membrane and ear canal normal.     Left Ear: Tympanic membrane normal.     Nose: Congestion present. No rhinorrhea.     Mouth/Throat:     Mouth: Mucous membranes are moist.     Pharynx: Oropharynx is clear. No oropharyngeal exudate or posterior oropharyngeal erythema.  Eyes:     Conjunctiva/sclera: Conjunctivae normal.  Cardiovascular:     Rate and Rhythm: Normal rate and regular rhythm.     Heart sounds: Normal heart sounds.  Pulmonary:     Effort: Pulmonary effort is normal. No respiratory distress.     Breath sounds: Normal breath sounds. No wheezing or rales.  Musculoskeletal:     Cervical back: Neck supple.  Lymphadenopathy:     Cervical: No cervical adenopathy.  Skin:    General: Skin is warm and dry.  Neurological:     Mental Status: She is alert and oriented to person, place, and time.      UC Treatments / Results  Labs (all labs ordered are listed, but only abnormal results are displayed) Labs Reviewed - No data to display  EKG   Radiology No results found.  Procedures Procedures (including critical care time)  Medications Ordered in UC Medications - No data to display  Initial Impression / Assessment and Plan / UC Course  I have reviewed the  triage vital signs and the nursing notes.  Pertinent labs & imaging results that were available during my care of the patient were reviewed by me and considered in my medical decision making (see chart for details).     82 year old with cough and congestion rhinorrhea since yesterday.  No fever, well-appearing, mildly hypertensive, afebrile, heart rate normal, lungs clear to auscultation, oxygen saturation normal.  Discussed with patient likely viral etiology will Rx cough medicine recommend follow-up PCP in 1 week if no improvement, ED for severe, worsening symptoms or concerns Final Clinical Impressions(s) / UC Diagnoses   Final diagnoses:  Viral illness   Discharge Instructions   None    ED Prescriptions     Medication Sig Dispense Auth. Provider   promethazine-dextromethorphan (PROMETHAZINE-DM) 6.25-15 MG/5ML syrup Take 5 mLs by mouth 4 (four) times daily as needed for up to 5 days for cough. 100 mL Meliton Rattan, [place]      PDMP not reviewed this encounter.   Meliton Rattan, [place] 07/16/23 1143

## 2023-07-25 DIAGNOSIS — K5900 Constipation, unspecified: Secondary | ICD-10-CM | POA: Diagnosis not present

## 2023-07-25 DIAGNOSIS — K279 Peptic ulcer, site unspecified, unspecified as acute or chronic, without hemorrhage or perforation: Secondary | ICD-10-CM | POA: Diagnosis not present

## 2023-07-25 DIAGNOSIS — K559 Vascular disorder of intestine, unspecified: Secondary | ICD-10-CM | POA: Diagnosis not present

## 2023-07-25 DIAGNOSIS — K649 Unspecified hemorrhoids: Secondary | ICD-10-CM | POA: Diagnosis not present

## 2023-07-25 DIAGNOSIS — R1032 Left lower quadrant pain: Secondary | ICD-10-CM | POA: Diagnosis not present

## 2023-08-08 DIAGNOSIS — M5459 Other low back pain: Secondary | ICD-10-CM | POA: Diagnosis not present

## 2023-08-12 DIAGNOSIS — M5459 Other low back pain: Secondary | ICD-10-CM | POA: Diagnosis not present

## 2023-08-12 DIAGNOSIS — M47816 Spondylosis without myelopathy or radiculopathy, lumbar region: Secondary | ICD-10-CM | POA: Diagnosis not present

## 2023-08-12 DIAGNOSIS — M51369 Other intervertebral disc degeneration, lumbar region without mention of lumbar back pain or lower extremity pain: Secondary | ICD-10-CM | POA: Diagnosis not present

## 2023-08-17 ENCOUNTER — Encounter: Payer: Self-pay | Admitting: Cardiology

## 2023-08-17 ENCOUNTER — Ambulatory Visit: Payer: Medicare Other | Attending: Cardiology | Admitting: Cardiology

## 2023-08-17 VITALS — BP 144/80 | HR 84 | Ht 67.0 in | Wt 164.0 lb

## 2023-08-17 DIAGNOSIS — I10 Essential (primary) hypertension: Secondary | ICD-10-CM

## 2023-08-17 DIAGNOSIS — I119 Hypertensive heart disease without heart failure: Secondary | ICD-10-CM

## 2023-08-17 DIAGNOSIS — E782 Mixed hyperlipidemia: Secondary | ICD-10-CM

## 2023-08-17 DIAGNOSIS — I2510 Atherosclerotic heart disease of native coronary artery without angina pectoris: Secondary | ICD-10-CM | POA: Diagnosis not present

## 2023-08-17 MED ORDER — APIXABAN 5 MG PO TABS: 5.0000 mg | ORAL_TABLET | Freq: Two times a day (BID) | ORAL | 3 refills | Status: DC

## 2023-08-17 NOTE — Addendum Note (Signed)
Addended by: Baldo Ash D on: 08/17/2023 08:42 AM   Modules accepted: Orders

## 2023-08-17 NOTE — Progress Notes (Addendum)
Cardiology Office Note:    Date:  08/17/2023   ID:  [name], DOB 30-May-1941, MRN [id number]  PCP:  Buckner Malta, MD  Cardiologist:  Gypsy Balsam, MD    Referring MD: Buckner Malta, MD   Chief Complaint  Patient presents with   Medication Management    Repatha is to expensive    History of Present Illness:    [name] is a 83 y.o. female past medical history significant for essential hypertension, dyslipidemia, intolerance to statin and now on PCSK9 agent, in 2020 she did have coronary CT angio which showed moderate disease in proximal to mid LAD, fractional flow reserve was negative.  Recently she ended up being in a hospital because of GI bleed.  She was find to have 5 ulcers.  Apparently it was caused by nonsteroidal anti-inflammatory dose medication has been withdrawn since that time I thinks stable.  She comes today to my office for follow-up.  She also got echocardiogram done which showed mildly diminished ejection fraction.  She overall she is doing well.  Denies have any chest pain tightness squeezing pressure burning chest  Past Medical History:  Diagnosis Date   Arthritis    Hyperlipemia    Hypertension    Hypertensive heart disease without heart failure    Hypothyroidism    Osteoarthritis of left knee 09/20/2017   Osteoarthritis of multiple joints    PONV (postoperative nausea and vomiting)    "years ago"   Primary osteoarthritis of right knee 03/11/2016   Statin intolerance    Wears glasses     Past Surgical History:  Procedure Laterality Date   ABDOMINAL HYSTERECTOMY  07/19/1976   ACHILLES TENDON SURGERY Right 06/27/2014   Procedure: RIGHT ACHILLES DEBRIDEMENT AND REPAIR WITH TRANSFER OF FLEXOR HALLUCIS LONGUS TENDON TO THE CALCANEUS  ;  Surgeon: Toni Arthurs, MD;  Location: [facility];  Service: Orthopedics;  Laterality: Right;   ACHILLES TENDON SURGERY Left 11/28/2014   Procedure: LEFT ACHILLES DEBRIDEMENT AND  REPAIR/GASTROC RECESSION;  Surgeon: Toni Arthurs, MD;  Location: [facility];  Service: Orthopedics;  Laterality: Left;   CATARACT EXTRACTION W/ INTRAOCULAR LENS IMPLANT Bilateral 2016   COLONOSCOPY     GASTROC RECESSION EXTREMITY Left 11/28/2014   Procedure: GASTROC RECESSION EXTREMITY;  Surgeon: Toni Arthurs, MD;  Location:  [facility];  Service: Orthopedics;  Laterality: Left;   KNEE ARTHROPLASTY Right 03/11/2016   Procedure: RIGHT TOTAL KNEE ARTHROPLASTY WITH NAVIGATION;  Surgeon: Samson Frederic, MD;  Location: [facility];  Service: Orthopedics;  Laterality: Right;   TONSILLECTOMY      Current Medications: Current Meds  Medication Sig   calcium carbonate (OS-CAL) 600 MG TABS tablet Take 600 mg by mouth 2 (two) times daily with a meal.   cholecalciferol (VITAMIN D3) 25 MCG (1000 UNIT) tablet Take 1,000 Units by mouth daily.   Cyanocobalamin (VITAMIN B-12 SL) Place 2,500 mcg under the tongue daily.   Evolocumab (REPATHA SURECLICK) 140 MG/ML SOAJ INJECT 140MG  INTO THE SKIN EVERY 14 DAYS. (Patient taking differently: Inject 140 mg into the skin every 14 (fourteen) days. INJECT 140MG  INTO THE SKIN EVERY 14 DAYS.)   KRILL OIL PO Take 2 capsules by mouth daily.   LATANOPROST OP Place 1 drop into both eyes daily. Without preservatives   levothyroxine (SYNTHROID) 88 MCG tablet Take 1 tablet by mouth daily.   Pantoprazole Sodium (PROTONIX PO) Take 1 tablet by mouth daily.   Probiotic Product (PROBIOTIC-10 PO) Take 1 tablet by  mouth daily.   ranolazine (RANEXA) 1000 MG SR tablet Take 1 tablet (1,000 mg total) by mouth 2 (two) times daily.   sacubitril-valsartan (ENTRESTO) 24-26 MG Take 1 tablet by mouth 2 (two) times daily.   timolol (TIMOPTIC) 0.25 % ophthalmic solution Place 1 drop into both eyes every morning.   vitamin E 180 MG (400 UNITS) capsule Take 400 Units by mouth daily.     Allergies:   Crestor [rosuvastatin], Statins, and Potassium penicillin g [penicillin g]    Social History   Socioeconomic History   Marital status: Widowed    Spouse name: Not on file   Number of children: Not on file   Years of education: Not on file   Highest education level: Not on file  Occupational History   Not on file  Tobacco Use   Smoking status: Never   Smokeless tobacco: Never  Substance and Sexual Activity   Alcohol use: No   Drug use: No   Sexual activity: Not on file  Other Topics Concern   Not on file  Social History Narrative   Not on file   Social Drivers of Health   Financial Resource Strain: Not on file  Food Insecurity: Not on file  Transportation Needs: Not on file  Physical Activity: Not on file  Stress: Not on file  Social Connections: Not on file     Family History: The patient's family history includes Heart disease in her brother, father, maternal grandfather, paternal grandfather, and sister; Hypercholesterolemia in her brother and sister. ROS:   Please see the history of present illness.    All 14 point review of systems negative except as described per history of present illness  EKGs/Labs/Other Studies Reviewed:         Recent Labs: No results found for requested labs within last 365 days.  Recent Lipid Panel    Component Value Date/Time   CHOL 259 (H) 04/08/2022 0836   TRIG 218 (H) 04/08/2022 0836   HDL 64 04/08/2022 0836   CHOLHDL 4.0 04/08/2022 0836   LDLCALC 155 (H) 04/08/2022 0836    Physical Exam:    VS:  BP (!) 144/80 (BP Location: Right Arm, Patient Position: Sitting)   Pulse 84   Ht 5\' 7"  (1.702 m)   Wt 164 lb (74.4 kg)   SpO2 97%   BMI 25.69 kg/m     Wt Readings from Last 3 Encounters:  08/17/23 164 lb (74.4 kg)  04/15/23 153 lb 9.6 oz (69.7 kg)  10/21/22 161 lb 12.8 oz (73.4 kg)     GEN:  Well nourished, well developed in no acute distress HEENT: Normal NECK: No JVD; No carotid bruits LYMPHATICS: No lymphadenopathy CARDIAC: RRR, no murmurs, no rubs, no gallops RESPIRATORY:  Clear to  auscultation without rales, wheezing or rhonchi  ABDOMEN: Soft, non-tender, non-distended MUSCULOSKELETAL:  No edema; No deformity  SKIN: Warm and dry LOWER EXTREMITIES: no swelling NEUROLOGIC:  Alert and oriented x 3 PSYCHIATRIC:  Normal affect   ASSESSMENT:    1. Coronary artery disease involving native coronary artery of native heart without angina pectoris   2. Hypertensive heart disease without heart failure   3. Mixed hyperlipidemia    PLAN:    In order of problems listed above:  Coronary disease stable from that point we will continue guideline directed medical therapy aspirin is out of the question because of GI bleed. Paroxysmal atrial fibrillation noted on the monitor.  Very small burden she need to be anticoagulated however  I want to get clearance from GI.  Tolerated place call to her GI doctor.  Options are anticoagulation or Watchman device if they think that we will be unsafe to anticoagulate.  Apparently she did have history of GI bleed before. Cardiomyopathy with mildly reduced left ventricle ejection fraction Entresto will be initiated, she just got prescription filled.  Will check Chem-7.  I will see her back in my office in 3 months I spoke to Dr. Jennye Boroughs her gastroenterologist, we do have permission to initiate Eliquis 5 TWICE daily the dose is appropriate to her age weight and kidney function, CBC will be checked next week   Medication Adjustments/Labs and Tests Ordered: Current medicines are reviewed at length with the patient today.  Concerns regarding medicines are outlined above.  No orders of the defined types were placed in this encounter.  Medication changes: No orders of the defined types were placed in this encounter.   Signed, Georgeanna Lea, MD, [facility] 08/17/2023 8:26 AM    [facility]

## 2023-08-17 NOTE — Patient Instructions (Addendum)
Medication Instructions:   START: Eliquis 5mg  1 tablet twice daily   Lab Work: Your physician recommends that you return for lab work in: 1 week You need to have labs done when you are fasting.  You can come Monday through Friday 8:30 am to 12:00 pm and 1:15 to 4:30. You do not need to make an appointment as the order has already been placed. The labs you are going to have done are BMET, CBC   Testing/Procedures: None Ordered   Follow-Up: At [facility], you and your health needs are our priority.  As part of our continuing mission to provide you with exceptional heart care, we have created designated Provider Care Teams.  These Care Teams include your primary Cardiologist (physician) and Advanced Practice Providers (APPs -  Physician Assistants and Nurse Practitioners) who all work together to provide you with the care you need, when you need it.  We recommend signing up for the patient portal called "MyChart".  Sign up information is provided on this After Visit Summary.  MyChart is used to connect with patients for Virtual Visits (Telemedicine).  Patients are able to view lab/test results, encounter notes, upcoming appointments, etc.  Non-urgent messages can be sent to your provider as well.   To learn more about what you can do with MyChart, go to [email or web address].    Your next appointment:   3 month(s)  The format for your next appointment:   In Person  Provider:   Gypsy Balsam, MD    Other Instructions Referral made to Lipid clinic- Pharm-D - they will call for appt

## 2023-08-24 DIAGNOSIS — I10 Essential (primary) hypertension: Secondary | ICD-10-CM | POA: Diagnosis not present

## 2023-08-24 DIAGNOSIS — I2510 Atherosclerotic heart disease of native coronary artery without angina pectoris: Secondary | ICD-10-CM | POA: Diagnosis not present

## 2023-08-25 LAB — BASIC METABOLIC PANEL
BUN/Creatinine Ratio: 22 (ref 12–28)
BUN: 19 mg/dL (ref 8–27)
CO2: 23 mmol/L (ref 20–29)
Calcium: 10.2 mg/dL (ref 8.7–10.3)
Chloride: 102 mmol/L (ref 96–106)
Creatinine, Ser: 0.88 mg/dL (ref 0.57–1.00)
Glucose: 112 mg/dL — ABNORMAL HIGH (ref 70–99)
Potassium: 4.7 mmol/L (ref 3.5–5.2)
Sodium: 139 mmol/L (ref 134–144)
eGFR: 65 mL/min/{1.73_m2} (ref >59)

## 2023-08-25 LAB — CBC
Hematocrit: 37.3 % (ref 34.0–46.6)
Hemoglobin: 12.1 g/dL (ref 11.1–15.9)
MCH: 30.2 pg (ref 26.6–33.0)
MCHC: 32.4 g/dL (ref 31.5–35.7)
MCV: 93 fL (ref 79–97)
Platelets: 233 10*3/uL (ref 150–450)
RBC: 4.01 x10E6/uL (ref 3.77–5.28)
RDW: 12.4 % (ref 11.7–15.4)
WBC: 6.2 10*3/uL (ref 3.4–10.8)

## 2023-08-29 DIAGNOSIS — M48061 Spinal stenosis, lumbar region without neurogenic claudication: Secondary | ICD-10-CM | POA: Insufficient documentation

## 2023-08-29 DIAGNOSIS — M5416 Radiculopathy, lumbar region: Secondary | ICD-10-CM | POA: Diagnosis not present

## 2023-08-31 ENCOUNTER — Telehealth: Payer: Self-pay

## 2023-08-31 DIAGNOSIS — R7302 Impaired glucose tolerance (oral): Secondary | ICD-10-CM | POA: Diagnosis not present

## 2023-08-31 DIAGNOSIS — Z0000 Encounter for general adult medical examination without abnormal findings: Secondary | ICD-10-CM | POA: Diagnosis not present

## 2023-08-31 DIAGNOSIS — I119 Hypertensive heart disease without heart failure: Secondary | ICD-10-CM | POA: Diagnosis not present

## 2023-08-31 DIAGNOSIS — Z780 Asymptomatic menopausal state: Secondary | ICD-10-CM | POA: Diagnosis not present

## 2023-08-31 DIAGNOSIS — E7800 Pure hypercholesterolemia, unspecified: Secondary | ICD-10-CM | POA: Diagnosis not present

## 2023-08-31 DIAGNOSIS — M542 Cervicalgia: Secondary | ICD-10-CM | POA: Diagnosis not present

## 2023-08-31 DIAGNOSIS — Z13820 Encounter for screening for osteoporosis: Secondary | ICD-10-CM | POA: Diagnosis not present

## 2023-08-31 DIAGNOSIS — M8580 Other specified disorders of bone density and structure, unspecified site: Secondary | ICD-10-CM | POA: Diagnosis not present

## 2023-08-31 NOTE — Telephone Encounter (Signed)
Lab Results reviewed with pt as per Dr. Vanetta Shawl note.  Pt verbalized understanding and had no additional questions. Routed to PCP

## 2023-09-05 ENCOUNTER — Other Ambulatory Visit: Payer: Self-pay | Admitting: Pharmacist

## 2023-09-05 NOTE — Progress Notes (Signed)
   09/05/2023 Name: [name] MRN: [id number] DOB: [birth year]  Chief Complaint  Patient presents with   Medication Access    Entresto and Repatha     [name] is a 83 y.o. year old female who presented for a telephone visit.   They were referred to the pharmacist by their PCP for assistance in managing medication access.    Subjective:  Care Team: Primary Care Provider: Buckner Malta, MD ; Next Scheduled Visit: 11/30/2023 Cardiologist: Bing Matter; Next Scheduled Visit: None  Medication Access/Adherence  Current Pharmacy:  [facility] - [place] - [address] [place] [address] Phone: [phone] Fax: [phone]   Patient reports affordability concerns with their medications: Yes  Patient reports access/transportation concerns to their pharmacy: No  Patient reports adherence concerns with their medications:  Yes  cost   Objective:  No results found for: "HGBA1C"  Lab Results  Component Value Date   CREATININE 0.88 08/24/2023   BUN 19 08/24/2023   NA 139 08/24/2023   K 4.7 08/24/2023   CL 102 08/24/2023   CO2 23 08/24/2023    Lab Results  Component Value Date   CHOL 259 (H) 04/08/2022   HDL 64 04/08/2022   LDLCALC 155 (H) 04/08/2022   TRIG 218 (H) 04/08/2022   CHOLHDL 4.0 04/08/2022     Assessment/Plan:   [name] was referred to me from Dr. York Grice for co-pay assistance with Repatha. Noted that patient was also prescribed Entresto recently.  [name] confirmed that she was previously enrolled with [employer] [facility]. I was able to locate her account in [employer] and enroll patient for Reptha and Entresto.   [employer] [facility] For Repatha:  Card ID: [id number] BIN: [id number] PCN: [id number] Group: [id number]  Lupita Shutter For Sherryll Burger:  Card ID: [id number] BIN: [id number] PCN: [id number] Group: [id number]  -Will call cards into [facility], [address], [place], [place] [address]  [phone]. -Will mail a copy of the documents to patient.    Reynold Bowen, PharmD Clinical Pharmacist [place] Direct Dial: [phone]

## 2023-09-09 ENCOUNTER — Telehealth: Payer: Self-pay | Admitting: Pharmacist

## 2023-09-09 NOTE — Progress Notes (Signed)
   09/09/2023  Patient ID: [name], female   DOB: 1941/06/22, 83 y.o.   MRN: [id number]  Follow up with patient today to inquire if she was able to pick up her Entresto and Repatha at [facility]. Patient states she is going by there today. I advised patient to contact me with any issues. I also informed patient that mailing was sent to her with the card information enclosed.  Patient was appreciative of my call.   Reynold Bowen, PharmD Clinical Pharmacist [place] Direct Dial: [phone]

## 2023-09-21 DIAGNOSIS — M5416 Radiculopathy, lumbar region: Secondary | ICD-10-CM | POA: Diagnosis not present

## 2023-10-07 DIAGNOSIS — M5481 Occipital neuralgia: Secondary | ICD-10-CM | POA: Insufficient documentation

## 2023-10-07 DIAGNOSIS — M48061 Spinal stenosis, lumbar region without neurogenic claudication: Secondary | ICD-10-CM | POA: Diagnosis not present

## 2023-10-07 DIAGNOSIS — M47812 Spondylosis without myelopathy or radiculopathy, cervical region: Secondary | ICD-10-CM | POA: Diagnosis not present

## 2023-10-24 DIAGNOSIS — R079 Chest pain, unspecified: Secondary | ICD-10-CM | POA: Diagnosis not present

## 2023-10-24 DIAGNOSIS — I129 Hypertensive chronic kidney disease with stage 1 through stage 4 chronic kidney disease, or unspecified chronic kidney disease: Secondary | ICD-10-CM | POA: Diagnosis not present

## 2023-10-24 DIAGNOSIS — I951 Orthostatic hypotension: Secondary | ICD-10-CM | POA: Diagnosis not present

## 2023-10-24 DIAGNOSIS — I959 Hypotension, unspecified: Secondary | ICD-10-CM | POA: Diagnosis not present

## 2023-10-24 DIAGNOSIS — R42 Dizziness and giddiness: Secondary | ICD-10-CM | POA: Diagnosis not present

## 2023-10-24 DIAGNOSIS — I10 Essential (primary) hypertension: Secondary | ICD-10-CM | POA: Diagnosis not present

## 2023-10-24 DIAGNOSIS — I480 Paroxysmal atrial fibrillation: Secondary | ICD-10-CM | POA: Diagnosis not present

## 2023-10-24 DIAGNOSIS — E039 Hypothyroidism, unspecified: Secondary | ICD-10-CM | POA: Diagnosis not present

## 2023-10-24 DIAGNOSIS — N189 Chronic kidney disease, unspecified: Secondary | ICD-10-CM | POA: Diagnosis not present

## 2023-11-16 ENCOUNTER — Ambulatory Visit: Attending: Cardiology | Admitting: Cardiology

## 2023-11-16 VITALS — BP 108/60 | HR 96 | Ht 67.0 in | Wt 168.4 lb

## 2023-11-16 DIAGNOSIS — I2510 Atherosclerotic heart disease of native coronary artery without angina pectoris: Secondary | ICD-10-CM | POA: Diagnosis not present

## 2023-11-16 DIAGNOSIS — I119 Hypertensive heart disease without heart failure: Secondary | ICD-10-CM | POA: Diagnosis not present

## 2023-11-16 DIAGNOSIS — Z789 Other specified health status: Secondary | ICD-10-CM | POA: Diagnosis not present

## 2023-11-16 DIAGNOSIS — E782 Mixed hyperlipidemia: Secondary | ICD-10-CM

## 2023-11-16 NOTE — Progress Notes (Signed)
 Cardiology Office Note:    Date:  11/16/2023   ID:  [name], DOB [birth year], MRN [id number]  PCP:  Harvest Lineman, MD  Cardiologist:  Ralene Burger, MD    Referring MD: Harvest Lineman, MD   Chief Complaint  Patient presents with   Follow-up    History of Present Illness:    [name] is a 83 y.o. female past medical history significant for essential hypertension, dyslipidemia, intolerance to statin now she is on PCSK9 agent, she did have coronary CT angio done in 2020 which showed moderate disease of proximal and mid LAD, fractional flow reserve was negative.  Also paroxysmal atrial fibrillation.  Comes today to months for follow-up overall doing great.  She is back on anticoagulation tolerating with no difficulties.  She her end up being in the emergency room recently because of low blood pressure.  It was in the morning she got up she started walking and became dizzy in the emergency room and received some fluids since that time she is trying to stay more hydrated and now probably normal problems since that time  Past Medical History:  Diagnosis Date   Arthritis    Hyperlipemia    Hypertension    Hypertensive heart disease without heart failure    Hypothyroidism    Osteoarthritis of left knee 09/20/2017   Osteoarthritis of multiple joints    PONV (postoperative nausea and vomiting)    "years ago"   Primary osteoarthritis of right knee 03/11/2016   Statin intolerance    Wears glasses     Past Surgical History:  Procedure Laterality Date   ABDOMINAL HYSTERECTOMY  07/19/1976   ACHILLES TENDON SURGERY Right 06/27/2014   Procedure: RIGHT ACHILLES DEBRIDEMENT AND REPAIR WITH TRANSFER OF FLEXOR HALLUCIS LONGUS TENDON TO THE CALCANEUS  ;  Surgeon: Amada Backer, MD;  Location: [facility];  Service: Orthopedics;  Laterality: Right;   ACHILLES TENDON SURGERY Left 11/28/2014   Procedure: LEFT ACHILLES DEBRIDEMENT AND REPAIR/GASTROC RECESSION;   Surgeon: Amada Backer, MD;  Location: [facility];  Service: Orthopedics;  Laterality: Left;   CATARACT EXTRACTION W/ INTRAOCULAR LENS IMPLANT Bilateral 2016   COLONOSCOPY     GASTROC RECESSION EXTREMITY Left 11/28/2014   Procedure: GASTROC RECESSION EXTREMITY;  Surgeon: Amada Backer, MD;  Location: [facility];  Service: Orthopedics;  Laterality: Left;   KNEE ARTHROPLASTY Right 03/11/2016   Procedure: RIGHT TOTAL KNEE ARTHROPLASTY WITH NAVIGATION;  Surgeon: Adonica Hoose, MD;  Location: [facility];  Service: Orthopedics;  Laterality: Right;   TONSILLECTOMY      Current Medications: Current Meds  Medication Sig   apixaban  (ELIQUIS ) 5 MG TABS tablet Take 1 tablet (5 mg total) by mouth 2 (two) times daily.   calcium  carbonate (OS-CAL) 600 MG TABS tablet Take 600 mg by mouth 2 (two) times daily with a meal.   cholecalciferol (VITAMIN D3) 25 MCG (1000 UNIT) tablet Take 1,000 Units by mouth daily.   Cyanocobalamin (VITAMIN B-12 SL) Place 2,500 mcg under the tongue daily.   Evolocumab  (REPATHA  SURECLICK) 140 MG/ML SOAJ INJECT 140MG  INTO THE SKIN EVERY 14 DAYS. (Patient taking differently: Inject 140 mg into the skin every 14 (fourteen) days. INJECT 140MG  INTO THE SKIN EVERY 14 DAYS.)   KRILL OIL PO Take 2 capsules by mouth daily.   LATANOPROST OP Place 1 drop into both eyes daily. Without preservatives   levothyroxine  (SYNTHROID ) 88 MCG tablet Take 1 tablet by mouth daily.   methocarbamol  (ROBAXIN ) 750  MG tablet Take 750 mg by mouth 2 (two) times daily.   pantoprazole (PROTONIX) 40 MG tablet Take 40 mg by mouth every morning.   Pantoprazole Sodium (PROTONIX PO) Take 1 tablet by mouth daily.   Probiotic Product (PROBIOTIC-10 PO) Take 1 tablet by mouth daily.   ranolazine  (RANEXA ) 1000 MG SR tablet Take 1 tablet (1,000 mg total) by mouth 2 (two) times daily.   sacubitril-valsartan (ENTRESTO ) 24-26 MG Take 1 tablet by mouth 2 (two) times daily.   timolol (TIMOPTIC) 0.25 %  ophthalmic solution Place 1 drop into both eyes every morning.   vitamin E 180 MG (400 UNITS) capsule Take 400 Units by mouth daily.     Allergies:   Crestor [rosuvastatin], Statins, and Potassium penicillin g [penicillin g]   Social History   Socioeconomic History   Marital status: Widowed    Spouse name: Not on file   Number of children: Not on file   Years of education: Not on file   Highest education level: Not on file  Occupational History   Not on file  Tobacco Use   Smoking status: Never   Smokeless tobacco: Never  Substance and Sexual Activity   Alcohol  use: No   Drug use: No   Sexual activity: Not on file  Other Topics Concern   Not on file  Social History Narrative   Not on file   Social Drivers of Health   Financial Resource Strain: Not on file  Food Insecurity: Not on file  Transportation Needs: Not on file  Physical Activity: Not on file  Stress: Not on file  Social Connections: Not on file     Family History: The patient's family history includes Heart disease in her brother, father, maternal grandfather, paternal grandfather, and sister; Hypercholesterolemia in her brother and sister. ROS:   Please see the history of present illness.    All 14 point review of systems negative except as described per history of present illness  EKGs/Labs/Other Studies Reviewed:         Recent Labs: 08/24/2023: BUN 19; Creatinine, Ser 0.88; Hemoglobin 12.1; Platelets 233; Potassium 4.7; Sodium 139  Recent Lipid Panel    Component Value Date/Time   CHOL 259 (H) 04/08/2022 0836   TRIG 218 (H) 04/08/2022 0836   HDL 64 04/08/2022 0836   CHOLHDL 4.0 04/08/2022 0836   LDLCALC 155 (H) 04/08/2022 0836    Physical Exam:    VS:  BP 108/60 (BP Location: Right Arm, Patient Position: Sitting)   Pulse 96   Ht 5\' 7"  (1.702 m)   Wt 168 lb 6.4 oz (76.4 kg)   SpO2 97%   BMI 26.38 kg/m     Wt Readings from Last 3 Encounters:  11/16/23 168 lb 6.4 oz (76.4 kg)  08/17/23  164 lb (74.4 kg)  04/15/23 153 lb 9.6 oz (69.7 kg)     GEN:  Well nourished, well developed in no acute distress HEENT: Normal NECK: No JVD; No carotid bruits LYMPHATICS: No lymphadenopathy CARDIAC: RRR, no murmurs, no rubs, no gallops RESPIRATORY:  Clear to auscultation without rales, wheezing or rhonchi  ABDOMEN: Soft, non-tender, non-distended MUSCULOSKELETAL:  No edema; No deformity  SKIN: Warm and dry LOWER EXTREMITIES: no swelling NEUROLOGIC:  Alert and oriented x 3 PSYCHIATRIC:  Normal affect   ASSESSMENT:    1. Coronary artery disease involving native coronary artery of native heart without angina pectoris   2. Hypertensive heart disease without heart failure   3. Mixed hyperlipidemia  4. Statin intolerance    PLAN:    In order of problems listed above:  Coronary disease stable from that point he is on guideline directed medical therapy which I will continue. Essential hypertension blood pressure well-controlled I encouraged her to be drinking plenty of fluids. Mixed dyslipidemia I do have blood test from 08/31/2023 with total cholesterol 237 HDL 59.  Will get full cholesterol profile. Statin intolerance noted on PCSK9 agent   Medication Adjustments/Labs and Tests Ordered: Current medicines are reviewed at length with the patient today.  Concerns regarding medicines are outlined above.  No orders of the defined types were placed in this encounter.  Medication changes: No orders of the defined types were placed in this encounter.   Signed, Manfred Seed, MD, [facility] 11/16/2023 10:19 AM    [facility]

## 2023-11-16 NOTE — Patient Instructions (Signed)
Medication Instructions:  Your physician recommends that you continue on your current medications as directed. Please refer to the Current Medication list given to you today.  *If you need a refill on your cardiac medications before your next appointment, please call your pharmacy*   Lab Work: None ordered If you have labs (blood work) drawn today and your tests are completely normal, you will receive your results only by: [place] (if you have MyChart) OR A paper copy in the mail If you have any lab test that is abnormal or we need to change your treatment, we will call you to review the results.   Testing/Procedures: None ordered   Follow-Up: At [facility], you and your health needs are our priority.  As part of our continuing mission to provide you with exceptional heart care, we have created designated Provider Care Teams.  These Care Teams include your primary Cardiologist (physician) and Advanced Practice Providers (APPs -  Physician Assistants and Nurse Practitioners) who all work together to provide you with the care you need, when you need it.  We recommend signing up for the patient portal called "MyChart".  Sign up information is provided on this After Visit Summary.  MyChart is used to connect with patients for Virtual Visits (Telemedicine).  Patients are able to view lab/test results, encounter notes, upcoming appointments, etc.  Non-urgent messages can be sent to your provider as well.   To learn more about what you can do with MyChart, go to [email or web address].    Your next appointment:   5 month(s)  The format for your next appointment:   In Person  Provider:   Jenne Campus, MD    Other Instructions none  Important Information About Sugar

## 2023-11-30 DIAGNOSIS — I959 Hypotension, unspecified: Secondary | ICD-10-CM | POA: Diagnosis not present

## 2023-11-30 DIAGNOSIS — E7800 Pure hypercholesterolemia, unspecified: Secondary | ICD-10-CM | POA: Diagnosis not present

## 2023-11-30 DIAGNOSIS — M47812 Spondylosis without myelopathy or radiculopathy, cervical region: Secondary | ICD-10-CM | POA: Diagnosis not present

## 2023-11-30 DIAGNOSIS — Z79899 Other long term (current) drug therapy: Secondary | ICD-10-CM | POA: Diagnosis not present

## 2023-11-30 DIAGNOSIS — Z789 Other specified health status: Secondary | ICD-10-CM | POA: Diagnosis not present

## 2023-11-30 DIAGNOSIS — N1831 Chronic kidney disease, stage 3a: Secondary | ICD-10-CM | POA: Diagnosis not present

## 2023-11-30 DIAGNOSIS — M159 Polyosteoarthritis, unspecified: Secondary | ICD-10-CM | POA: Diagnosis not present

## 2023-11-30 DIAGNOSIS — E039 Hypothyroidism, unspecified: Secondary | ICD-10-CM | POA: Diagnosis not present

## 2023-12-02 DIAGNOSIS — E119 Type 2 diabetes mellitus without complications: Secondary | ICD-10-CM | POA: Diagnosis not present

## 2024-01-03 DIAGNOSIS — E119 Type 2 diabetes mellitus without complications: Secondary | ICD-10-CM | POA: Diagnosis not present

## 2024-01-05 ENCOUNTER — Other Ambulatory Visit: Payer: Self-pay | Admitting: Cardiology

## 2024-02-03 DIAGNOSIS — E119 Type 2 diabetes mellitus without complications: Secondary | ICD-10-CM | POA: Diagnosis not present

## 2024-02-12 ENCOUNTER — Other Ambulatory Visit: Payer: Self-pay | Admitting: Cardiology

## 2024-02-13 NOTE — Telephone Encounter (Signed)
 Prescription refill request for Eliquis  received. Indication:cad Last office visit:4/25 Scr:0.88  2/25 Age: 83 Weight:76.4  kg  Prescription refilled

## 2024-02-14 ENCOUNTER — Other Ambulatory Visit: Payer: Self-pay | Admitting: Cardiology

## 2024-02-29 DIAGNOSIS — L820 Inflamed seborrheic keratosis: Secondary | ICD-10-CM | POA: Diagnosis not present

## 2024-02-29 DIAGNOSIS — L700 Acne vulgaris: Secondary | ICD-10-CM | POA: Diagnosis not present

## 2024-02-29 DIAGNOSIS — D485 Neoplasm of uncertain behavior of skin: Secondary | ICD-10-CM | POA: Diagnosis not present

## 2024-02-29 DIAGNOSIS — L578 Other skin changes due to chronic exposure to nonionizing radiation: Secondary | ICD-10-CM | POA: Diagnosis not present

## 2024-02-29 DIAGNOSIS — L814 Other melanin hyperpigmentation: Secondary | ICD-10-CM | POA: Diagnosis not present

## 2024-03-01 DIAGNOSIS — I5189 Other ill-defined heart diseases: Secondary | ICD-10-CM | POA: Diagnosis not present

## 2024-03-01 DIAGNOSIS — N1831 Chronic kidney disease, stage 3a: Secondary | ICD-10-CM | POA: Diagnosis not present

## 2024-03-01 DIAGNOSIS — I25118 Atherosclerotic heart disease of native coronary artery with other forms of angina pectoris: Secondary | ICD-10-CM | POA: Diagnosis not present

## 2024-03-01 DIAGNOSIS — E785 Hyperlipidemia, unspecified: Secondary | ICD-10-CM | POA: Diagnosis not present

## 2024-03-01 DIAGNOSIS — I480 Paroxysmal atrial fibrillation: Secondary | ICD-10-CM | POA: Diagnosis not present

## 2024-03-01 DIAGNOSIS — M542 Cervicalgia: Secondary | ICD-10-CM | POA: Diagnosis not present

## 2024-04-16 ENCOUNTER — Encounter: Payer: Self-pay | Admitting: Cardiology

## 2024-04-16 ENCOUNTER — Ambulatory Visit: Attending: Cardiology | Admitting: Cardiology

## 2024-04-16 VITALS — BP 110/72 | HR 81 | Ht 67.0 in | Wt 167.0 lb

## 2024-04-16 DIAGNOSIS — E782 Mixed hyperlipidemia: Secondary | ICD-10-CM | POA: Diagnosis not present

## 2024-04-16 DIAGNOSIS — I2510 Atherosclerotic heart disease of native coronary artery without angina pectoris: Secondary | ICD-10-CM | POA: Diagnosis not present

## 2024-04-16 DIAGNOSIS — Z789 Other specified health status: Secondary | ICD-10-CM

## 2024-04-16 DIAGNOSIS — I10 Essential (primary) hypertension: Secondary | ICD-10-CM

## 2024-04-16 DIAGNOSIS — R0609 Other forms of dyspnea: Secondary | ICD-10-CM | POA: Diagnosis not present

## 2024-04-16 DIAGNOSIS — M7660 Achilles tendinitis, unspecified leg: Secondary | ICD-10-CM | POA: Insufficient documentation

## 2024-04-16 NOTE — Progress Notes (Signed)
 Cardiology Office Note:    Date:  04/16/2024   ID:  [name], DOB August 27, 1940, MRN [id number]  PCP:  Clemmie Nest, MD  Cardiologist:  Lamar Fitch, MD    Referring MD: Clemmie Nest, MD   Chief Complaint  Patient presents with   Follow-up    History of Present Illness:    [name] is a 83 y.o. female past medical history significant for essential hypertension, dyslipidemia, intolerance to statin now she is on PCSK9 agent, coronary artery disease coronary CT angio done in 2020 showed moderate disease in proximal mid LAD, fractional flow reserve was negative also history of paroxysmal atrial fibrillation, maintained sinus rhythm, anticoagulated.  Comes today to my office for follow-up, cardiac wise doing well biggest problem she has is chronic back pain.  She says much worse than it was last year.  Denies have any chest pain tightness squeezing pressure burning chest.  No palpitations dizziness passing out  Past Medical History:  Diagnosis Date   Arthritis    Hyperlipemia    Hypertension    Hypertensive heart disease without heart failure    Hypothyroidism    Osteoarthritis of left knee 09/20/2017   Osteoarthritis of multiple joints    PONV (postoperative nausea and vomiting)    years ago   Primary osteoarthritis of right knee 03/11/2016   Statin intolerance    Wears glasses     Past Surgical History:  Procedure Laterality Date   ABDOMINAL HYSTERECTOMY  07/19/1976   ACHILLES TENDON SURGERY Right 06/27/2014   Procedure: RIGHT ACHILLES DEBRIDEMENT AND REPAIR WITH TRANSFER OF FLEXOR HALLUCIS LONGUS TENDON TO THE CALCANEUS  ;  Surgeon: Norleen Armor, MD;  Location: [facility];  Service: Orthopedics;  Laterality: Right;   ACHILLES TENDON SURGERY Left 11/28/2014   Procedure: LEFT ACHILLES DEBRIDEMENT AND REPAIR/GASTROC RECESSION;  Surgeon: Norleen Armor, MD;  Location: [facility];  Service: Orthopedics;  Laterality: Left;    CATARACT EXTRACTION W/ INTRAOCULAR LENS IMPLANT Bilateral 2016   COLONOSCOPY     GASTROC RECESSION EXTREMITY Left 11/28/2014   Procedure: GASTROC RECESSION EXTREMITY;  Surgeon: Norleen Armor, MD;  Location: [facility];  Service: Orthopedics;  Laterality: Left;   KNEE ARTHROPLASTY Right 03/11/2016   Procedure: RIGHT TOTAL KNEE ARTHROPLASTY WITH NAVIGATION;  Surgeon: Redell Shoals, MD;  Location: [facility];  Service: Orthopedics;  Laterality: Right;   TONSILLECTOMY      Current Medications: Current Meds  Medication Sig   apixaban  (ELIQUIS ) 5 MG TABS tablet Take 1 tablet by mouth 2 times daily.   ascorbic acid  (VITAMIN C ) 1000 MG tablet Take 1,000 mg by mouth daily.   calcium  carbonate (OS-CAL) 600 MG TABS tablet Take 600 mg by mouth 2 (two) times daily with a meal.   cholecalciferol (VITAMIN D3) 25 MCG (1000 UNIT) tablet Take 1,000 Units by mouth daily.   Cyanocobalamin (VITAMIN B-12 SL) Place 2,500 mcg under the tongue daily.   Evolocumab  (REPATHA  SURECLICK) 140 MG/ML SOAJ INJECT 140MG  INTO THE SKIN EVERY 14 DAYS.   KRILL OIL PO Take 2 capsules by mouth daily.   LATANOPROST OP Place 1 drop into both eyes daily. Without preservatives   levothyroxine  (SYNTHROID ) 88 MCG tablet Take 1 tablet by mouth daily.   methocarbamol  (ROBAXIN ) 750 MG tablet Take 750 mg by mouth 2 (two) times daily.   pantoprazole (PROTONIX) 40 MG tablet Take 40 mg by mouth every morning.   Pantoprazole Sodium (PROTONIX PO) Take 1 tablet by mouth daily.  Probiotic Product (PROBIOTIC-10 PO) Take 1 tablet by mouth daily.   ranolazine  (RANEXA ) 1000 MG SR tablet Take 1 tablet by mouth 2 times daily.   sacubitril-valsartan (ENTRESTO ) 24-26 MG Take 1 tablet by mouth 2 (two) times daily.   timolol (TIMOPTIC) 0.25 % ophthalmic solution Place 1 drop into both eyes every morning.   vitamin E 180 MG (400 UNITS) capsule Take 400 Units by mouth daily.     Allergies:   Crestor [rosuvastatin], Statins, and Potassium  penicillin g [penicillin g]   Social History   Socioeconomic History   Marital status: Widowed    Spouse name: Not on file   Number of children: Not on file   Years of education: Not on file   Highest education level: Not on file  Occupational History   Not on file  Tobacco Use   Smoking status: Never   Smokeless tobacco: Never  Substance and Sexual Activity   Alcohol  use: No   Drug use: No   Sexual activity: Not on file  Other Topics Concern   Not on file  Social History Narrative   Not on file   Social Drivers of Health   Financial Resource Strain: Not on file  Food Insecurity: Not on file  Transportation Needs: Not on file  Physical Activity: Not on file  Stress: Not on file  Social Connections: Not on file     Family History: The patient's family history includes Heart disease in her brother, father, maternal grandfather, paternal grandfather, and sister; Hypercholesterolemia in her brother and sister. ROS:   Please see the history of present illness.    All 14 point review of systems negative except as described per history of present illness  EKGs/Labs/Other Studies Reviewed:    EKG Interpretation Date/Time:  Monday April 16 2024 13:47:55 EDT Ventricular Rate:  81 PR Interval:  170 QRS Duration:  88 QT Interval:  366 QTC Calculation: 425 R Axis:   -25  Text Interpretation: Normal sinus rhythm Low voltage QRS Possible Anterolateral infarct , age undetermined Abnormal ECG When compared with ECG of 15-Apr-2023 13:47, Borderline criteria for Anterior infarct are now Present Borderline criteria for Anterolateral infarct are now Present ST no longer depressed in Inferior leads T wave inversion no longer evident in Inferior leads Confirmed by Bernie Charleston [phone]) on 04/16/2024 1:59:00 PM    Recent Labs: 08/24/2023: BUN 19; Creatinine, Ser 0.88; Hemoglobin 12.1; Platelets 233; Potassium 4.7; Sodium 139  Recent Lipid Panel    Component Value Date/Time    CHOL 259 (H) 04/08/2022 0836   TRIG 218 (H) 04/08/2022 0836   HDL 64 04/08/2022 0836   CHOLHDL 4.0 04/08/2022 0836   LDLCALC 155 (H) 04/08/2022 0836    Physical Exam:    VS:  BP 110/72   Pulse 81   Ht 5' 7 (1.702 m)   Wt 167 lb (75.8 kg)   SpO2 97%   BMI 26.16 kg/m     Wt Readings from Last 3 Encounters:  04/16/24 167 lb (75.8 kg)  11/16/23 168 lb 6.4 oz (76.4 kg)  08/17/23 164 lb (74.4 kg)     GEN:  Well nourished, well developed in no acute distress HEENT: Normal NECK: No JVD; No carotid bruits LYMPHATICS: No lymphadenopathy CARDIAC: RRR, no murmurs, no rubs, no gallops RESPIRATORY:  Clear to auscultation without rales, wheezing or rhonchi  ABDOMEN: Soft, non-tender, non-distended MUSCULOSKELETAL:  No edema; No deformity  SKIN: Warm and dry LOWER EXTREMITIES: no swelling NEUROLOGIC:  Alert and  oriented x 3 PSYCHIATRIC:  Normal affect   ASSESSMENT:    1. Coronary artery disease involving native coronary artery of native heart without angina pectoris   2. Primary hypertension   3. Mixed hyperlipidemia   4. Statin intolerance    PLAN:    In order of problems listed above:  Coronary disease stable from that point review no signs and symptoms of worsening condition. Dyspnea on exertion she started complaining of having dyspnea on exertion in certain situations which is worse than was before.  Will schedule her to have echocardiogram done to assess left ventricular ejection fraction. Essential hypertension: Blood pressure well-controlled continue present management. Dyslipidemia she is taking Repatha  but she does have some difficulty with injections I will refer her to our pharmacist to see if we can switch her to Leqvio.   Medication Adjustments/Labs and Tests Ordered: Current medicines are reviewed at length with the patient today.  Concerns regarding medicines are outlined above.  Orders Placed This Encounter  Procedures   EKG 12-Lead   Medication changes:  No orders of the defined types were placed in this encounter.   Signed, Lamar DOROTHA Fitch, MD, [facility] 04/16/2024 2:08 PM    [facility]

## 2024-04-16 NOTE — Patient Instructions (Signed)
 Medication Instructions:  Your physician recommends that you continue on your current medications as directed. Please refer to the Current Medication list given to you today.  *If you need a refill on your cardiac medications before your next appointment, please call your pharmacy*   Lab Work: None Ordered If you have labs (blood work) drawn today and your tests are completely normal, you will receive your results only by: MyChart Message (if you have MyChart) OR A paper copy in the mail If you have any lab test that is abnormal or we need to change your treatment, we will call you to review the results.   Testing/Procedures: Your physician has requested that you have an echocardiogram. Echocardiography is a painless test that uses sound waves to create images of your heart. It provides your doctor with information about the size and shape of your heart and how well your heart's chambers and valves are working. This procedure takes approximately one hour. There are no restrictions for this procedure. Please do NOT wear cologne, perfume, aftershave, or lotions (deodorant is allowed). Please arrive 15 minutes prior to your appointment time.  Please note: We ask at that you not bring children with you during ultrasound (echo/ vascular) testing. Due to room size and safety concerns, children are not allowed in the ultrasound rooms during exams. Our front office staff cannot provide observation of children in our lobby area while testing is being conducted. An adult accompanying a patient to their appointment will only be allowed in the ultrasound room at the discretion of the ultrasound technician under special circumstances. We apologize for any inconvenience.    Follow-Up: At [facility], you and your health needs are our priority.  As part of our continuing mission to provide you with exceptional heart care, we have created designated Provider Care Teams.  These Care Teams include your  primary Cardiologist (physician) and Advanced Practice Providers (APPs -  Physician Assistants and Nurse Practitioners) who all work together to provide you with the care you need, when you need it.  We recommend signing up for the patient portal called MyChart.  Sign up information is provided on this After Visit Summary.  MyChart is used to connect with patients for Virtual Visits (Telemedicine).  Patients are able to view lab/test results, encounter notes, upcoming appointments, etc.  Non-urgent messages can be sent to your provider as well.   To learn more about what you can do with MyChart, go to [email or web address].    Your next appointment:   6 month(s)  The format for your next appointment:   In Person  Provider:   Lamar Fitch, MD    Other Instructions Referral to Pharm D

## 2024-04-18 ENCOUNTER — Ambulatory Visit: Attending: Cardiology

## 2024-04-18 DIAGNOSIS — R0609 Other forms of dyspnea: Secondary | ICD-10-CM

## 2024-04-18 LAB — ECHOCARDIOGRAM COMPLETE
AR max vel: 2.05 cm2
AV Area VTI: 2.05 cm2
AV Area mean vel: 2.04 cm2
AV Mean grad: 3 mmHg
AV Peak grad: 6.3 mmHg
Ao pk vel: 1.26 m/s
Area-P 1/2: 3.53 cm2
MV VTI: 1.02 cm2
S' Lateral: 2.5 cm

## 2024-04-20 ENCOUNTER — Ambulatory Visit: Payer: Self-pay | Admitting: Cardiology

## 2024-04-30 ENCOUNTER — Encounter: Payer: Self-pay | Admitting: Pharmacist Clinician (PhC)/ Clinical Pharmacy Specialist

## 2024-04-30 ENCOUNTER — Telehealth: Payer: Self-pay

## 2024-04-30 ENCOUNTER — Ambulatory Visit: Admitting: Pharmacist Clinician (PhC)/ Clinical Pharmacy Specialist

## 2024-04-30 DIAGNOSIS — E782 Mixed hyperlipidemia: Secondary | ICD-10-CM | POA: Diagnosis not present

## 2024-04-30 NOTE — Progress Notes (Signed)
 Office Visit    Patient Name: [name] Date of Encounter: 04/30/2024  Primary Care Provider:  Clemmie Nest, MD Primary Cardiologist:  Kardie Tobb, DO  Chief Complaint    Hyperlipidemia   Significant Past Medical History   CAD CAC 189 (2020) - 63rd percentile; prox-midLAD 50-69% stenosis  HTN Controlled on   AF CHADS2-VASc = 5, on Eliquis   CHF EF improved to 55-60%  (from 40-45%), on Entresto         Allergies  Allergen Reactions   Crestor [Rosuvastatin] Other (See Comments)    Myalgia   Statins Other (See Comments)    Myalgia   Potassium Penicillin G [Penicillin G] Rash    History of Present Illness    [name] is a 83 y.o. female patient of Dr Bernie, in the office today to discuss options for cholesterol management.  Ms Like has been on Repatha  for about 5 years, but notes she is having difficulty with injecting and would like to consider Leqvio as an option.   She has not had a dose of Repatha  for at least 4 weeks.    Insurance Carrier:  [employer]  [employer]:   current grant, ends in December   LDL Cholesterol goal:  LDL < 70  Current Medications:   Repatha  - has missed last 2 doses due to malfunctioning pens.    Previously tried:  pravastatin, rosuvastatin - myalgias  Family Hx:  father had heart disease, alcoholsims, died at 59; mgf had MI at 66 died at 12; brother has 5 stents, sister has 1 stent; 1 daughter with lung cancer   Social Hx: Tobacco: no Alcohol : no     Diet:  eats out some, likes vegetables, no fast food, no ground beef or eggs (GI); chicken for protein, occasion fish; occasional crackers  Exercise: gym occasionally (about twice weekly)   Accessory Clinical Findings   11/2022:  TC 212, TG 173, HDL 56, LDL 121  Lab Results  Component Value Date   CHOL 259 (H) 04/08/2022   HDL 64 04/08/2022   LDLCALC 155 (H) 04/08/2022   TRIG 218 (H) 04/08/2022   CHOLHDL 4.0 04/08/2022    No results found for: LIPOA  No  results found for: ALT, AST, GGT, ALKPHOS, BILITOT Lab Results  Component Value Date   CREATININE 0.88 08/24/2023   BUN 19 08/24/2023   NA 139 08/24/2023   K 4.7 08/24/2023   CL 102 08/24/2023   CO2 23 08/24/2023   No results found for: HGBA1C  Home Medications    Current Outpatient Medications  Medication Sig Dispense Refill   apixaban  (ELIQUIS ) 5 MG TABS tablet Take 1 tablet by mouth 2 times daily. 60 tablet 5   ascorbic acid  (VITAMIN C ) 1000 MG tablet Take 1,000 mg by mouth daily.     calcium  carbonate (OS-CAL) 600 MG TABS tablet Take 600 mg by mouth 2 (two) times daily with a meal.     cholecalciferol (VITAMIN D3) 25 MCG (1000 UNIT) tablet Take 1,000 Units by mouth daily.     Cyanocobalamin (VITAMIN B-12 SL) Place 2,500 mcg under the tongue daily.     Evolocumab  (REPATHA  SURECLICK) 140 MG/ML SOAJ INJECT 140MG  INTO THE SKIN EVERY 14 DAYS. 2 mL 11   KRILL OIL PO Take 2 capsules by mouth daily.     LATANOPROST OP Place 1 drop into both eyes daily. Without preservatives     levothyroxine  (SYNTHROID ) 88 MCG tablet Take 1 tablet by mouth daily.  methocarbamol  (ROBAXIN ) 750 MG tablet Take 750 mg by mouth 2 (two) times daily.     pantoprazole (PROTONIX) 40 MG tablet Take 40 mg by mouth every morning.     Pantoprazole Sodium (PROTONIX PO) Take 1 tablet by mouth daily.     Probiotic Product (PROBIOTIC-10 PO) Take 1 tablet by mouth daily.     ranolazine  (RANEXA ) 1000 MG SR tablet Take 1 tablet by mouth 2 times daily. 180 tablet 2   sacubitril-valsartan (ENTRESTO ) 24-26 MG Take 1 tablet by mouth 2 (two) times daily. 180 tablet 3   timolol (TIMOPTIC) 0.25 % ophthalmic solution Place 1 drop into both eyes every morning.     vitamin E 180 MG (400 UNITS) capsule Take 400 Units by mouth daily.     No current facility-administered medications for this visit.     Assessment & Plan    Hyperlipemia Assessment: Patient with ASCVD not at LDL goal of < 70 Most recent LDL 121 on  11/2022 Has experienced difficulty lately with using the Repatha  pen (has arthritis in her hands) Not able to tolerate statins secondary to myalgias Reviewed options for lowering LDL cholesterol, specifically inclisiran.  Discussed mechanisms of action, dosing, side effects, potential decreases in LDL cholesterol and costs.  Also reviewed potential options for patient assistance.  Plan: Patient agreeable to starting Leqvio Signed service center start form for benefits investigation Update labs today, last were > 1 year ago. Repeat labs after 4 months (about 1 month after second dose) Lipid Liver function Patient was given information on [employer] - currently has grant through end of December.      Allean Mink, PharmD CPP [facility] [address] [place], [place] [address] [phone]  04/30/2024, 12:58 PM

## 2024-04-30 NOTE — Patient Instructions (Addendum)
 Your Results:             Your most recent labs Goal  Total Cholesterol 212 < 200  Triglycerides 173 < 150  HDL (happy/good cholesterol) 56 > 40  LDL (lousy/bad cholesterol 131 < 70   Medication changes:  We will start the process to get Leqvio covered by your insurance.  Once the prior authorization is complete, I will call/send a MyChart message to let you know and confirm pharmacy information.   The first 2 doses are 3 months apart, then every 6 months thereafter.    The first dose will need to be done at the infusion center in [place], but after that the new infusion center in [place] should be open.  You will be able to get further doses locally.    Lab orders:  We want to repeat labs after 2-3 months.  We will send you a lab order to remind you once we get closer to that time.    Patient Assistance:    Please reach out to us  in mid-December to get the grant renewed for 2026.  At that time we should also be able to get you a grant for Cardiomyopathy (this will cover Eliquis  and Entresto )   Thank you for choosing [facility]

## 2024-04-30 NOTE — Telephone Encounter (Signed)
 Left message on My Chart with Echo results per Dr. Karry note. Routed to PCP.

## 2024-04-30 NOTE — Assessment & Plan Note (Addendum)
 Assessment: Patient with ASCVD not at LDL goal of < 70 Most recent LDL 121 on 11/2022 Has experienced difficulty lately with using the Repatha  pen (has arthritis in her hands) Not able to tolerate statins secondary to myalgias Reviewed options for lowering LDL cholesterol, specifically inclisiran.  Discussed mechanisms of action, dosing, side effects, potential decreases in LDL cholesterol and costs.  Also reviewed potential options for patient assistance.  Plan: Patient agreeable to starting Leqvio Signed service center start form for benefits investigation Update labs today, last were > 1 year ago. Repeat labs after 4 months (about 1 month after second dose) Lipid Liver function Patient was given information on [employer] - currently has grant through end of December.

## 2024-05-01 LAB — LIPID PANEL
Chol/HDL Ratio: 4 ratio (ref 0.0–4.4)
Cholesterol, Total: 241 mg/dL — ABNORMAL HIGH (ref 100–199)
HDL: 60 mg/dL (ref >39)
LDL Chol Calc (NIH): 141 mg/dL — ABNORMAL HIGH (ref 0–99)
Triglycerides: 226 mg/dL — ABNORMAL HIGH (ref 0–149)
VLDL Cholesterol Cal: 40 mg/dL (ref 5–40)

## 2024-05-02 ENCOUNTER — Other Ambulatory Visit: Payer: Self-pay | Admitting: Pharmacist Clinician (PhC)/ Clinical Pharmacy Specialist

## 2024-05-02 NOTE — Progress Notes (Signed)
 Please transfer to [facility] when open

## 2024-05-03 ENCOUNTER — Ambulatory Visit: Payer: Self-pay | Admitting: Pharmacist Clinician (PhC)/ Clinical Pharmacy Specialist

## 2024-05-04 DIAGNOSIS — W19XXXA Unspecified fall, initial encounter: Secondary | ICD-10-CM | POA: Diagnosis not present

## 2024-05-04 DIAGNOSIS — S0012XA Contusion of left eyelid and periocular area, initial encounter: Secondary | ICD-10-CM | POA: Diagnosis not present

## 2024-05-04 DIAGNOSIS — M25532 Pain in left wrist: Secondary | ICD-10-CM | POA: Diagnosis not present

## 2024-05-08 ENCOUNTER — Telehealth: Payer: Self-pay | Admitting: Pharmacy Technician

## 2024-05-08 NOTE — Telephone Encounter (Addendum)
 [name],  Patient has been approved and will be scheduled as soon as possible.  Auth Submission: APPROVED Site of care: Site of care: [facility] Payer: [employer] DUAL Medication & CPT/J Code(s) submitted: Leqvio (Inclisiran) [id number] Diagnosis Code: E78.5 Route of submission (phone, fax, portal):  Phone # Fax # Auth type: Buy/Bill PB Units/visits requested: X3 DOSES Reference number: [id number] Approval from: 05/01/24 to 05/01/25   [employer]:  Pending - [facility] aware

## 2024-05-21 ENCOUNTER — Ambulatory Visit (INDEPENDENT_AMBULATORY_CARE_PROVIDER_SITE_OTHER)

## 2024-05-21 VITALS — BP 147/78 | HR 84 | Temp 98.2°F | Resp 16 | Ht 66.0 in | Wt 172.0 lb

## 2024-05-21 DIAGNOSIS — E782 Mixed hyperlipidemia: Secondary | ICD-10-CM | POA: Diagnosis not present

## 2024-05-21 MED ORDER — INCLISIRAN SODIUM 284 MG/1.5ML ~~LOC~~ SOSY
284.0000 mg | PREFILLED_SYRINGE | Freq: Once | SUBCUTANEOUS | Status: AC
  Administered 2024-05-21: 284 mg via SUBCUTANEOUS
  Filled 2024-05-21: qty 1.5

## 2024-05-21 NOTE — Progress Notes (Signed)
 Diagnosis: Hyperlipidemia  Provider:  Mannam, Praveen MD  Procedure: Injection  Leqvio (inclisiran), Dose: 284 mg, Site: subcutaneous, Number of injections: 1  Injection Site(s): Left arm  Post Care: Observation period completed  Discharge: Condition: Good, Destination: Home . AVS Provided  Performed by:  Neil Brickell, RN

## 2024-05-21 NOTE — Patient Instructions (Signed)
 Inclisiran Injection What is this medication? INCLISIRAN (in kli SIR an) treats high cholesterol. It works by decreasing bad cholesterol (such as LDL) in your blood. Changes to diet and exercise are often combined with this medication. This medicine may be used for other purposes; ask your health care provider or pharmacist if you have questions. COMMON BRAND NAME(S): LEQVIO What should I tell my care team before I take this medication? They need to know if you have any of these conditions: An unusual or allergic reaction to inclisiran, other medications, foods, dyes, or preservatives Pregnant or trying to get pregnant Breast-feeding How should I use this medication? This medication is injected under the skin. It is given by your care team in a hospital or clinic setting. Talk to your care team about the use of this medication in children. Special care may be needed. Overdosage: If you think you have taken too much of this medicine contact a poison control center or emergency room at once. NOTE: This medicine is only for you. Do not share this medicine with others. What if I miss a dose? Keep appointments for follow-up doses. It is important not to miss your dose. Call your care team if you are unable to keep an appointment. What may interact with this medication? Interactions are not expected. This list may not describe all possible interactions. Give your health care provider a list of all the medicines, herbs, non-prescription drugs, or dietary supplements you use. Also tell them if you smoke, drink alcohol, or use illegal drugs. Some items may interact with your medicine. What should I watch for while using this medication? Visit your care team for regular checks on your progress. Tell your care team if your symptoms do not start to get better or if they get worse. You may need blood work while you are taking this medication. What side effects may I notice from receiving this  medication? Side effects that you should report to your care team as soon as possible: Allergic reactions--skin rash, itching, hives, swelling of the face, lips, tongue, or throat Side effects that usually do not require medical attention (report these to your care team if they continue or are bothersome): Joint pain Pain, redness, or irritation at injection site This list may not describe all possible side effects. Call your doctor for medical advice about side effects. You may report side effects to FDA at 1-800-FDA-1088. Where should I keep my medication? This medication is given in a hospital or clinic. It will not be stored at home. NOTE: This sheet is a summary. It may not cover all possible information. If you have questions about this medicine, talk to your doctor, pharmacist, or health care provider.  2024 Elsevier/Gold Standard (2023-06-17 00:00:00)

## 2024-08-01 ENCOUNTER — Telehealth: Payer: Self-pay | Admitting: Pharmacist

## 2024-08-01 NOTE — Progress Notes (Signed)
 Attempted to contact patient for medication management re: renewal for [employer] for Entresto .  Left HIPAA compliant message for patient to return my call at their convenience.   Annabella Galla, PharmD Clinical Pharmacist [place] Direct Dial: [phone]

## 2024-08-27 ENCOUNTER — Ambulatory Visit
